# Patient Record
Sex: Female | Born: 1960 | Race: White | Hispanic: No | Marital: Married | Smoking: Former smoker
Health system: Southern US, Community
[De-identification: ages and names within clinical notes are randomized; demographics above are authoritative.]

## PROBLEM LIST (undated history)

## (undated) ENCOUNTER — Emergency Department (HOSPITAL_COMMUNITY): Payer: PRIVATE HEALTH INSURANCE | Source: Home / Self Care

## (undated) DIAGNOSIS — Z8051 Family history of malignant neoplasm of kidney: Secondary | ICD-10-CM

## (undated) DIAGNOSIS — M797 Fibromyalgia: Secondary | ICD-10-CM

## (undated) DIAGNOSIS — K449 Diaphragmatic hernia without obstruction or gangrene: Secondary | ICD-10-CM

## (undated) DIAGNOSIS — I252 Old myocardial infarction: Secondary | ICD-10-CM

## (undated) DIAGNOSIS — K5790 Diverticulosis of intestine, part unspecified, without perforation or abscess without bleeding: Secondary | ICD-10-CM

## (undated) DIAGNOSIS — R609 Edema, unspecified: Secondary | ICD-10-CM

## (undated) DIAGNOSIS — J189 Pneumonia, unspecified organism: Secondary | ICD-10-CM

## (undated) DIAGNOSIS — Z8249 Family history of ischemic heart disease and other diseases of the circulatory system: Secondary | ICD-10-CM

## (undated) DIAGNOSIS — Z9889 Other specified postprocedural states: Secondary | ICD-10-CM

## (undated) DIAGNOSIS — I10 Essential (primary) hypertension: Secondary | ICD-10-CM

## (undated) DIAGNOSIS — M722 Plantar fascial fibromatosis: Secondary | ICD-10-CM

## (undated) DIAGNOSIS — Z801 Family history of malignant neoplasm of trachea, bronchus and lung: Secondary | ICD-10-CM

## (undated) DIAGNOSIS — N390 Urinary tract infection, site not specified: Secondary | ICD-10-CM

## (undated) DIAGNOSIS — E559 Vitamin D deficiency, unspecified: Secondary | ICD-10-CM

## (undated) DIAGNOSIS — E039 Hypothyroidism, unspecified: Secondary | ICD-10-CM

## (undated) DIAGNOSIS — Z9289 Personal history of other medical treatment: Secondary | ICD-10-CM

## (undated) DIAGNOSIS — Z809 Family history of malignant neoplasm, unspecified: Secondary | ICD-10-CM

## (undated) DIAGNOSIS — R911 Solitary pulmonary nodule: Secondary | ICD-10-CM

## (undated) DIAGNOSIS — L853 Xerosis cutis: Secondary | ICD-10-CM

## (undated) DIAGNOSIS — Z803 Family history of malignant neoplasm of breast: Secondary | ICD-10-CM

## (undated) DIAGNOSIS — Z800 Family history of malignant neoplasm of digestive organs: Secondary | ICD-10-CM

## (undated) DIAGNOSIS — T7840XA Allergy, unspecified, initial encounter: Secondary | ICD-10-CM

## (undated) DIAGNOSIS — K2270 Barrett's esophagus without dysplasia: Secondary | ICD-10-CM

## (undated) DIAGNOSIS — I219 Acute myocardial infarction, unspecified: Secondary | ICD-10-CM

## (undated) DIAGNOSIS — Z8042 Family history of malignant neoplasm of prostate: Secondary | ICD-10-CM

## (undated) DIAGNOSIS — E669 Obesity, unspecified: Secondary | ICD-10-CM

## (undated) DIAGNOSIS — K0530 Chronic periodontitis, unspecified: Secondary | ICD-10-CM

## (undated) DIAGNOSIS — K219 Gastro-esophageal reflux disease without esophagitis: Secondary | ICD-10-CM

## (undated) DIAGNOSIS — Z973 Presence of spectacles and contact lenses: Secondary | ICD-10-CM

## (undated) DIAGNOSIS — M1990 Unspecified osteoarthritis, unspecified site: Secondary | ICD-10-CM

## (undated) DIAGNOSIS — R748 Abnormal levels of other serum enzymes: Secondary | ICD-10-CM

## (undated) DIAGNOSIS — R112 Nausea with vomiting, unspecified: Secondary | ICD-10-CM

## (undated) DIAGNOSIS — R51 Headache: Secondary | ICD-10-CM

## (undated) HISTORY — DX: Family history of malignant neoplasm, unspecified: Z80.9

## (undated) HISTORY — DX: Family history of ischemic heart disease and other diseases of the circulatory system: Z82.49

## (undated) HISTORY — DX: Family history of malignant neoplasm of breast: Z80.3

## (undated) HISTORY — DX: Old myocardial infarction: I25.2

## (undated) HISTORY — DX: Pneumonia, unspecified organism: J18.9

## (undated) HISTORY — DX: Plantar fascial fibromatosis: M72.2

## (undated) HISTORY — PX: KNEE SURGERY: SHX244

## (undated) HISTORY — DX: Fibromyalgia: M79.7

## (undated) HISTORY — DX: Obesity, unspecified: E66.9

## (undated) HISTORY — DX: Vitamin D deficiency, unspecified: E55.9

## (undated) HISTORY — DX: Hypothyroidism, unspecified: E03.9

## (undated) HISTORY — DX: Abnormal levels of other serum enzymes: R74.8

## (undated) HISTORY — DX: Gastro-esophageal reflux disease without esophagitis: K21.9

## (undated) HISTORY — DX: Urinary tract infection, site not specified: N39.0

## (undated) HISTORY — DX: Xerosis cutis: L85.3

## (undated) HISTORY — PX: LAPAROSCOPIC ENDOMETRIOSIS FULGURATION: SUR769

## (undated) HISTORY — DX: Headache: R51

## (undated) HISTORY — DX: Essential (primary) hypertension: I10

## (undated) HISTORY — DX: Family history of malignant neoplasm of kidney: Z80.51

## (undated) HISTORY — DX: Edema, unspecified: R60.9

## (undated) HISTORY — DX: Family history of malignant neoplasm of trachea, bronchus and lung: Z80.1

## (undated) HISTORY — DX: Unspecified osteoarthritis, unspecified site: M19.90

## (undated) HISTORY — DX: Diaphragmatic hernia without obstruction or gangrene: K44.9

## (undated) HISTORY — DX: Solitary pulmonary nodule: R91.1

## (undated) HISTORY — PX: ESOPHAGEAL DILATION: SHX303

## (undated) HISTORY — DX: Chronic periodontitis, unspecified: K05.30

## (undated) HISTORY — DX: Family history of malignant neoplasm of digestive organs: Z80.0

## (undated) HISTORY — DX: Presence of spectacles and contact lenses: Z97.3

## (undated) HISTORY — DX: Diverticulosis of intestine, part unspecified, without perforation or abscess without bleeding: K57.90

## (undated) HISTORY — DX: Family history of malignant neoplasm of prostate: Z80.42

## (undated) HISTORY — DX: Personal history of other medical treatment: Z92.89

## (undated) HISTORY — DX: Allergy, unspecified, initial encounter: T78.40XA

## (undated) HISTORY — DX: Barrett's esophagus without dysplasia: K22.70

---

## 1996-01-29 DIAGNOSIS — Z9289 Personal history of other medical treatment: Secondary | ICD-10-CM

## 1996-01-29 HISTORY — DX: Personal history of other medical treatment: Z92.89

## 1997-05-18 ENCOUNTER — Inpatient Hospital Stay (HOSPITAL_COMMUNITY): Admission: AD | Admit: 1997-05-18 | Discharge: 1997-05-18 | Payer: Self-pay | Admitting: Obstetrics

## 1997-06-07 ENCOUNTER — Ambulatory Visit (HOSPITAL_COMMUNITY): Admission: RE | Admit: 1997-06-07 | Discharge: 1997-06-07 | Payer: Self-pay | Admitting: Obstetrics and Gynecology

## 2002-11-11 ENCOUNTER — Encounter: Payer: Self-pay | Admitting: Emergency Medicine

## 2002-11-11 ENCOUNTER — Emergency Department (HOSPITAL_COMMUNITY): Admission: EM | Admit: 2002-11-11 | Discharge: 2002-11-11 | Payer: Self-pay | Admitting: Emergency Medicine

## 2004-04-25 ENCOUNTER — Observation Stay (HOSPITAL_COMMUNITY): Admission: RE | Admit: 2004-04-25 | Discharge: 2004-04-26 | Payer: Self-pay | Admitting: Obstetrics and Gynecology

## 2004-04-29 ENCOUNTER — Inpatient Hospital Stay (HOSPITAL_COMMUNITY): Admission: AD | Admit: 2004-04-29 | Discharge: 2004-04-29 | Payer: Self-pay | Admitting: Obstetrics and Gynecology

## 2006-05-24 ENCOUNTER — Emergency Department: Payer: Self-pay | Admitting: Emergency Medicine

## 2007-01-29 DIAGNOSIS — R748 Abnormal levels of other serum enzymes: Secondary | ICD-10-CM

## 2007-01-29 HISTORY — PX: CHOLECYSTECTOMY: SHX55

## 2007-01-29 HISTORY — DX: Abnormal levels of other serum enzymes: R74.8

## 2007-01-29 HISTORY — PX: COLONOSCOPY: SHX174

## 2008-01-29 DIAGNOSIS — R519 Headache, unspecified: Secondary | ICD-10-CM

## 2008-01-29 HISTORY — PX: VAGINAL HYSTERECTOMY: SUR661

## 2008-01-29 HISTORY — DX: Headache, unspecified: R51.9

## 2008-01-29 HISTORY — PX: CYSTOCELE REPAIR: SHX163

## 2008-01-29 HISTORY — PX: RECTOCELE REPAIR: SHX761

## 2010-01-28 HISTORY — PX: BLADDER SURGERY: SHX569

## 2010-07-04 ENCOUNTER — Other Ambulatory Visit: Payer: Self-pay | Admitting: Obstetrics & Gynecology

## 2010-07-04 DIAGNOSIS — N6489 Other specified disorders of breast: Secondary | ICD-10-CM

## 2010-08-18 ENCOUNTER — Ambulatory Visit: Payer: Self-pay | Admitting: Family Medicine

## 2010-10-09 ENCOUNTER — Ambulatory Visit (HOSPITAL_BASED_OUTPATIENT_CLINIC_OR_DEPARTMENT_OTHER)
Admission: RE | Admit: 2010-10-09 | Discharge: 2010-10-09 | Disposition: A | Discharge status: Home / Self Care | Payer: PRIVATE HEALTH INSURANCE | Source: Ambulatory Visit | Attending: Urology | Admitting: Urology

## 2010-10-09 ENCOUNTER — Ambulatory Visit (HOSPITAL_BASED_OUTPATIENT_CLINIC_OR_DEPARTMENT_OTHER): Admit: 2010-10-09 | Payer: Self-pay | Admitting: Urology

## 2010-10-09 ENCOUNTER — Ambulatory Visit (HOSPITAL_COMMUNITY): Payer: PRIVATE HEALTH INSURANCE | Attending: Anesthesiology

## 2010-10-09 DIAGNOSIS — N3010 Interstitial cystitis (chronic) without hematuria: Secondary | ICD-10-CM | POA: Insufficient documentation

## 2010-10-09 DIAGNOSIS — K219 Gastro-esophageal reflux disease without esophagitis: Secondary | ICD-10-CM | POA: Insufficient documentation

## 2010-10-09 DIAGNOSIS — N3946 Mixed incontinence: Secondary | ICD-10-CM | POA: Insufficient documentation

## 2010-10-09 DIAGNOSIS — I517 Cardiomegaly: Secondary | ICD-10-CM | POA: Insufficient documentation

## 2010-10-09 DIAGNOSIS — R112 Nausea with vomiting, unspecified: Secondary | ICD-10-CM | POA: Insufficient documentation

## 2010-10-09 DIAGNOSIS — N949 Unspecified condition associated with female genital organs and menstrual cycle: Secondary | ICD-10-CM | POA: Insufficient documentation

## 2010-10-09 DIAGNOSIS — J45909 Unspecified asthma, uncomplicated: Secondary | ICD-10-CM | POA: Insufficient documentation

## 2010-10-09 LAB — POCT HEMOGLOBIN-HEMACUE: Hemoglobin: 13.6 g/dL (ref 12.0–15.0)

## 2010-10-23 NOTE — Op Note (Signed)
  NAMEHAIVEN, NARDONE     ACCOUNT NO.:  [id number]  MEDICAL RECORD NO.:  [id number]  LOCATION:                               FACILITY:  [facility]  PHYSICIAN:  Martina Sinner, MD DATE OF BIRTH:  May 04, 1960  DATE OF PROCEDURE:  10/09/2010 DATE OF DISCHARGE:                              OPERATIVE REPORT   PREOPERATIVE DIAGNOSIS:  Pelvic pain.  POSTOPERATIVE DIAGNOSIS:  Pelvic pain.  SURGERY:  Cystoscopy, bladder hydrodistention, bladder instillation therapy.  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  She has complicated voiding dysfunction.  She has significant pelvic pain, worse for the last several days.  The patient was prepped and draped in usual fashion.  A 21 [place] scope was utilized.  Bladder mucosa and trigone were normal.  There is no stitch, foreign body, or carcinoma.  Bladder was hydrodissected to 450 mL.  Bladder was emptied.  I reinspected the bladder and there are no glomerulations.  Our finding is interstitial cystitis.  Bladder was emptied.  As a separate procedure, a latex-free catheter was inserted in the bladder and I instilled 15 cc of 0.5% Marcaine plus 400 mg of Pyridium.  My usual techniques will be utilized to diagnose the patient with interstitial cystitis.          ______________________________ Martina Sinner, MD     SAM/MEDQ  D:  10/09/2010  T:  10/09/2010  Job:  [id number]  Electronically Signed by Alfredo Martinez MD on 10/23/2010 09:48:32 AM

## 2011-03-26 ENCOUNTER — Encounter: Payer: Self-pay | Admitting: Medical

## 2011-03-26 ENCOUNTER — Ambulatory Visit (INDEPENDENT_AMBULATORY_CARE_PROVIDER_SITE_OTHER): Payer: No Typology Code available for payment source | Admitting: Medical

## 2011-03-26 VITALS — BP 110/80 | HR 76 | Temp 97.8°F | Resp 16 | Ht 62.0 in | Wt 235.0 lb

## 2011-03-26 DIAGNOSIS — R51 Headache: Secondary | ICD-10-CM

## 2011-03-26 DIAGNOSIS — Z0000 Encounter for general adult medical examination without abnormal findings: Secondary | ICD-10-CM

## 2011-03-26 DIAGNOSIS — J45909 Unspecified asthma, uncomplicated: Secondary | ICD-10-CM | POA: Insufficient documentation

## 2011-03-26 DIAGNOSIS — K219 Gastro-esophageal reflux disease without esophagitis: Secondary | ICD-10-CM | POA: Insufficient documentation

## 2011-03-26 DIAGNOSIS — R040 Epistaxis: Secondary | ICD-10-CM

## 2011-03-26 DIAGNOSIS — E039 Hypothyroidism, unspecified: Secondary | ICD-10-CM | POA: Insufficient documentation

## 2011-03-26 LAB — POCT URINALYSIS DIPSTICK
Nitrite, UA: NEGATIVE
Spec Grav, UA: 1.025
Urobilinogen, UA: NEGATIVE

## 2011-03-26 MED ORDER — LEVOTHYROXINE SODIUM 200 MCG PO TABS: 200.0000 ug | ORAL_TABLET | Freq: Every day | ORAL | Status: DC

## 2011-03-26 MED ORDER — LIOTHYRONINE SODIUM 25 MCG PO TABS: 25.0000 ug | ORAL_TABLET | Freq: Every day | ORAL | Status: DC

## 2011-03-26 MED ORDER — FLUTICASONE-SALMETEROL 100-50 MCG/DOSE IN AEPB: 1.0000 | INHALATION_SPRAY | Freq: Two times a day (BID) | RESPIRATORY_TRACT | Status: DC

## 2011-03-26 MED ORDER — ESOMEPRAZOLE MAGNESIUM 40 MG PO CPDR: 40.0000 mg | DELAYED_RELEASE_CAPSULE | Freq: Every day | ORAL | Status: DC

## 2011-03-26 MED ORDER — ALBUTEROL SULFATE HFA 108 (90 BASE) MCG/ACT IN AERS: 2.0000 | INHALATION_SPRAY | Freq: Four times a day (QID) | RESPIRATORY_TRACT | Status: DC | PRN
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
## 2011-03-26 NOTE — Patient Instructions (Signed)

## 2011-03-27 ENCOUNTER — Telehealth: Payer: Self-pay | Admitting: Family Medicine

## 2011-03-28 ENCOUNTER — Other Ambulatory Visit: Payer: Self-pay | Admitting: Medical

## 2011-03-28 DIAGNOSIS — R040 Epistaxis: Secondary | ICD-10-CM

## 2011-03-28 DIAGNOSIS — R51 Headache: Secondary | ICD-10-CM

## 2011-03-28 MED ORDER — LEVOTHYROXINE SODIUM 200 MCG PO TABS: 200.0000 ug | ORAL_TABLET | Freq: Every day | ORAL | Status: DC

## 2011-03-28 MED ORDER — LIOTHYRONINE SODIUM 25 MCG PO TABS: 25.0000 ug | ORAL_TABLET | Freq: Every day | ORAL | Status: DC

## 2011-03-28 MED ORDER — ESOMEPRAZOLE MAGNESIUM 40 MG PO CPDR: 40.0000 mg | DELAYED_RELEASE_CAPSULE | Freq: Every day | ORAL | Status: DC

## 2011-03-28 MED ORDER — ALBUTEROL SULFATE HFA 108 (90 BASE) MCG/ACT IN AERS: 2.0000 | INHALATION_SPRAY | Freq: Four times a day (QID) | RESPIRATORY_TRACT | Status: DC | PRN

## 2011-03-28 MED ORDER — FLUTICASONE-SALMETEROL 100-50 MCG/DOSE IN AEPB: 1.0000 | INHALATION_SPRAY | Freq: Two times a day (BID) | RESPIRATORY_TRACT | Status: DC

## 2011-03-28 NOTE — Telephone Encounter (Signed)
i sent all med refills just now.

## 2011-03-29 ENCOUNTER — Telehealth: Payer: Self-pay | Admitting: Family Medicine

## 2011-03-29 NOTE — Telephone Encounter (Signed)
[place], IMAGING 04/01/11 @ 1215PM. CLS

## 2011-03-29 NOTE — Telephone Encounter (Signed)
Message copied by Janeice Robinson on Fri Mar 29, 2011  9:12 AM ------      Message from: Aleen Campi, DAVID S      Created: Thu Mar 28, 2011 10:45 AM       pls set up head CT without contrast, due to new onset chronic headache age 51 with nosebleeds.  Order is in the computer.

## 2011-03-29 NOTE — Telephone Encounter (Signed)
PATIENT WAS NOTIFIED THAT HER MEDICATIONS WAS SENT TO THE PHARMACY. CLS

## 2011-04-01 ENCOUNTER — Ambulatory Visit
Admission: RE | Admit: 2011-04-01 | Discharge: 2011-04-01 | Disposition: A | Discharge status: Home / Self Care | Payer: No Typology Code available for payment source | Source: Ambulatory Visit | Attending: Medical | Admitting: Medical

## 2011-04-01 DIAGNOSIS — R51 Headache: Secondary | ICD-10-CM

## 2011-04-01 DIAGNOSIS — R040 Epistaxis: Secondary | ICD-10-CM

## 2011-04-23 ENCOUNTER — Ambulatory Visit (INDEPENDENT_AMBULATORY_CARE_PROVIDER_SITE_OTHER): Payer: No Typology Code available for payment source | Admitting: Medical

## 2011-04-23 ENCOUNTER — Encounter: Payer: Self-pay | Admitting: Medical

## 2011-04-23 VITALS — BP 102/80 | HR 78 | Temp 97.7°F | Resp 16 | Wt 231.0 lb

## 2011-04-23 DIAGNOSIS — E039 Hypothyroidism, unspecified: Secondary | ICD-10-CM

## 2011-04-23 DIAGNOSIS — R51 Headache: Secondary | ICD-10-CM

## 2011-04-23 DIAGNOSIS — J329 Chronic sinusitis, unspecified: Secondary | ICD-10-CM

## 2011-04-23 DIAGNOSIS — K219 Gastro-esophageal reflux disease without esophagitis: Secondary | ICD-10-CM

## 2011-04-23 LAB — TSH: TSH: 0.727 u[IU]/mL (ref 0.350–4.500)

## 2011-04-23 MED ORDER — AMOXICILLIN 875 MG PO TABS: 875.0000 mg | ORAL_TABLET | Freq: Two times a day (BID) | ORAL | Status: AC

## 2011-04-23 MED ORDER — ESOMEPRAZOLE MAGNESIUM 40 MG PO CPDR: 40.0000 mg | DELAYED_RELEASE_CAPSULE | Freq: Every day | ORAL | Status: DC
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
## 2011-04-24 ENCOUNTER — Other Ambulatory Visit: Payer: Self-pay | Admitting: Medical

## 2011-04-24 MED ORDER — LIOTHYRONINE SODIUM 5 MCG PO TABS: 5.0000 ug | ORAL_TABLET | Freq: Every day | ORAL | Status: DC

## 2011-04-24 MED ORDER — VITAMIN D (ERGOCALCIFEROL) 1.25 MG (50000 UNIT) PO CAPS: 50000.0000 [IU] | ORAL_CAPSULE | ORAL | Status: DC

## 2011-05-31 ENCOUNTER — Ambulatory Visit (INDEPENDENT_AMBULATORY_CARE_PROVIDER_SITE_OTHER): Payer: No Typology Code available for payment source | Admitting: Medical

## 2011-05-31 ENCOUNTER — Encounter: Payer: Self-pay | Admitting: Medical

## 2011-05-31 ENCOUNTER — Ambulatory Visit
Admission: RE | Admit: 2011-05-31 | Discharge: 2011-05-31 | Disposition: A | Discharge status: Home / Self Care | Payer: No Typology Code available for payment source | Source: Ambulatory Visit | Attending: Medical | Admitting: Medical

## 2011-05-31 VITALS — BP 102/70 | HR 84 | Temp 97.6°F | Resp 16 | Wt 220.0 lb

## 2011-05-31 DIAGNOSIS — S93409A Sprain of unspecified ligament of unspecified ankle, initial encounter: Secondary | ICD-10-CM

## 2011-05-31 DIAGNOSIS — R51 Headache: Secondary | ICD-10-CM

## 2011-05-31 DIAGNOSIS — E039 Hypothyroidism, unspecified: Secondary | ICD-10-CM

## 2011-05-31 DIAGNOSIS — S99911A Unspecified injury of right ankle, initial encounter: Secondary | ICD-10-CM

## 2011-05-31 DIAGNOSIS — S8990XA Unspecified injury of unspecified lower leg, initial encounter: Secondary | ICD-10-CM

## 2011-05-31 DIAGNOSIS — S93401A Sprain of unspecified ligament of right ankle, initial encounter: Secondary | ICD-10-CM

## 2011-05-31 LAB — TSH: TSH: 0.116 u[IU]/mL — ABNORMAL LOW (ref 0.350–4.500)

## 2011-05-31 LAB — T4, FREE: Free T4: 2.33 ng/dL — ABNORMAL HIGH (ref 0.80–1.80)

## 2011-05-31 MED ORDER — TOPIRAMATE 25 MG PO TABS: ORAL_TABLET | ORAL | Status: DC

## 2011-05-31 NOTE — Patient Instructions (Signed)
I want you to elevate the leg often.  I will you to start alternating between ice and heat.  For example, ice for 20 minutes, let the foot then warm up to room temp, then heat for 20 minutes.  Alternate this regiment 2-3 times daily if possible  i would still use crutches some to help healing, but start putting more weight on the foot in general.  Continue the brace for next 10-14 days.  At this point, continue doing either towel exercise or ABCs for rehabilitation.    Consider ankle sleeve for the swelling.  If not much improved in 10-14 days, then lets consider formal physical therapy.
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
## 2011-06-04 ENCOUNTER — Other Ambulatory Visit: Payer: Self-pay | Admitting: Medical

## 2011-06-04 DIAGNOSIS — E039 Hypothyroidism, unspecified: Secondary | ICD-10-CM

## 2011-06-07 ENCOUNTER — Encounter: Payer: Self-pay | Admitting: Medical

## 2011-06-07 ENCOUNTER — Ambulatory Visit (INDEPENDENT_AMBULATORY_CARE_PROVIDER_SITE_OTHER): Payer: No Typology Code available for payment source | Admitting: Medical

## 2011-06-07 ENCOUNTER — Telehealth: Payer: Self-pay | Admitting: Family Medicine

## 2011-06-07 VITALS — BP 118/88 | HR 84 | Temp 97.8°F | Resp 16 | Wt 226.0 lb

## 2011-06-07 DIAGNOSIS — E039 Hypothyroidism, unspecified: Secondary | ICD-10-CM

## 2011-06-07 DIAGNOSIS — S99911A Unspecified injury of right ankle, initial encounter: Secondary | ICD-10-CM

## 2011-06-07 DIAGNOSIS — S8990XA Unspecified injury of unspecified lower leg, initial encounter: Secondary | ICD-10-CM

## 2011-06-07 DIAGNOSIS — S93409A Sprain of unspecified ligament of unspecified ankle, initial encounter: Secondary | ICD-10-CM

## 2011-06-07 DIAGNOSIS — S99919A Unspecified injury of unspecified ankle, initial encounter: Secondary | ICD-10-CM

## 2011-06-07 NOTE — Telephone Encounter (Signed)
PATIENT IS AWARE OF HER APPOINTMENT AS OF 06/07/11 @ 1059 AM. CLS MURPHY Vanessa Berg. [address] [place], [place] APPOINTMENT 06/07/11 @ 200 PM. CLS
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
## 2011-07-10 ENCOUNTER — Other Ambulatory Visit: Payer: No Typology Code available for payment source

## 2011-07-12 ENCOUNTER — Other Ambulatory Visit: Payer: No Typology Code available for payment source

## 2011-07-12 DIAGNOSIS — E039 Hypothyroidism, unspecified: Secondary | ICD-10-CM

## 2011-07-12 LAB — T4, FREE: Free T4: 1.61 ng/dL (ref 0.80–1.80)

## 2011-07-15 ENCOUNTER — Other Ambulatory Visit: Payer: Self-pay | Admitting: Medical

## 2011-07-15 MED ORDER — LEVOTHYROXINE SODIUM 175 MCG PO TABS: 175.0000 ug | ORAL_TABLET | Freq: Every day | ORAL | Status: DC

## 2011-07-19 ENCOUNTER — Telehealth: Payer: Self-pay | Admitting: Medical

## 2011-07-19 NOTE — Telephone Encounter (Signed)
PATIENT STATES THAT SHE HAS NEVER HAD A PROBLEM WITH THE PREDINSONE OR THE STERIOD BEFORE AND SHE HAS TAKEN THEM. PATIENT WAS MADE AWARE THAT SHE COULD USE THE MEDICATION PATCH. CLS

## 2011-07-19 NOTE — Telephone Encounter (Signed)
Has she ever used prednisone or other steroids? Any problems with these?  If not, I think the medicated patch would be fine.

## 2011-08-21 ENCOUNTER — Other Ambulatory Visit: Payer: Self-pay | Admitting: Orthopedic Surgery

## 2011-08-21 DIAGNOSIS — R609 Edema, unspecified: Secondary | ICD-10-CM

## 2011-08-22 ENCOUNTER — Other Ambulatory Visit: Payer: No Typology Code available for payment source

## 2011-09-10 ENCOUNTER — Telehealth: Payer: Self-pay | Admitting: Family Medicine

## 2011-09-16 ENCOUNTER — Encounter: Payer: No Typology Code available for payment source | Admitting: Medical

## 2011-09-19 ENCOUNTER — Other Ambulatory Visit: Payer: Self-pay | Admitting: Orthopedic Surgery

## 2011-09-19 DIAGNOSIS — M722 Plantar fascial fibromatosis: Secondary | ICD-10-CM

## 2011-09-22 ENCOUNTER — Ambulatory Visit
Admission: RE | Admit: 2011-09-22 | Discharge: 2011-09-22 | Disposition: A | Discharge status: Home / Self Care | Payer: No Typology Code available for payment source | Source: Ambulatory Visit | Attending: Orthopedic Surgery | Admitting: Orthopedic Surgery

## 2011-09-22 DIAGNOSIS — M722 Plantar fascial fibromatosis: Secondary | ICD-10-CM

## 2011-09-25 ENCOUNTER — Encounter: Payer: Self-pay | Admitting: Medical

## 2011-09-25 ENCOUNTER — Ambulatory Visit (INDEPENDENT_AMBULATORY_CARE_PROVIDER_SITE_OTHER): Payer: No Typology Code available for payment source | Admitting: Medical

## 2011-09-25 VITALS — BP 128/80 | HR 80 | Temp 97.6°F | Resp 16 | Wt 226.0 lb

## 2011-09-25 DIAGNOSIS — J45909 Unspecified asthma, uncomplicated: Secondary | ICD-10-CM

## 2011-09-25 DIAGNOSIS — K219 Gastro-esophageal reflux disease without esophagitis: Secondary | ICD-10-CM

## 2011-09-25 DIAGNOSIS — M629 Disorder of muscle, unspecified: Secondary | ICD-10-CM

## 2011-09-25 DIAGNOSIS — E039 Hypothyroidism, unspecified: Secondary | ICD-10-CM

## 2011-09-25 DIAGNOSIS — E669 Obesity, unspecified: Secondary | ICD-10-CM | POA: Insufficient documentation

## 2011-09-25 DIAGNOSIS — R51 Headache: Secondary | ICD-10-CM

## 2011-09-25 MED ORDER — OMEPRAZOLE 40 MG PO CPDR: 40.0000 mg | DELAYED_RELEASE_CAPSULE | Freq: Every day | ORAL | Status: DC
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
## 2011-09-26 LAB — BASIC METABOLIC PANEL
CO2: 28 mEq/L (ref 19–32)
Calcium: 9.3 mg/dL (ref 8.4–10.5)
Creat: 0.71 mg/dL (ref 0.50–1.10)
Glucose, Bld: 84 mg/dL (ref 70–99)

## 2011-09-26 LAB — TSH: TSH: 0.162 u[IU]/mL — ABNORMAL LOW (ref 0.350–4.500)

## 2011-09-26 LAB — HEMOGLOBIN A1C: Mean Plasma Glucose: 114 mg/dL (ref <117)

## 2011-09-26 LAB — T4, FREE: Free T4: 1.18 ng/dL (ref 0.80–1.80)

## 2011-10-03 ENCOUNTER — Encounter: Payer: Self-pay | Admitting: Family Medicine

## 2011-10-03 ENCOUNTER — Ambulatory Visit (INDEPENDENT_AMBULATORY_CARE_PROVIDER_SITE_OTHER): Payer: No Typology Code available for payment source | Admitting: Family Medicine

## 2011-10-03 VITALS — BP 124/80 | HR 64 | Temp 97.8°F | Wt 232.0 lb

## 2011-10-03 DIAGNOSIS — J45909 Unspecified asthma, uncomplicated: Secondary | ICD-10-CM

## 2011-10-03 DIAGNOSIS — J309 Allergic rhinitis, unspecified: Secondary | ICD-10-CM

## 2011-10-03 DIAGNOSIS — J209 Acute bronchitis, unspecified: Secondary | ICD-10-CM

## 2011-10-03 MED ORDER — HYDROCOD POLST-CHLORPHEN POLST 10-8 MG/5ML PO LQCR: 5.0000 mL | Freq: Two times a day (BID) | ORAL | Status: DC | PRN

## 2011-10-03 MED ORDER — AMOXICILLIN 875 MG PO TABS: 875.0000 mg | ORAL_TABLET | Freq: Two times a day (BID) | ORAL | Status: AC

## 2011-10-03 NOTE — Progress Notes (Signed)
  Subjective:    Patient ID: Vanessa Berg, female    DOB: Jan 22, 1961, 51 y.o.   MRN: [id number]  HPI 5 days ago she started having difficulty with a productive cough, fever,SOB,, arthralgias ,headache. Now she is also having difficulty with rhinorrhea and sneezing. She does have seasonal allergies and asthma. She continues on medications listed in the chart She does not smoke.   Review of Systems     Objective:   Physical Exam alert and in no distress. Tympanic membranes and canals are normal. Throat is clear. Tonsils are normal. Neck is supple without adenopathy or thyromegaly. Cardiac exam shows a regular sinus rhythm without murmurs or gallops. Lungs are clear to auscultation.        Assessment & Plan:   1. Asthma    2. Allergic rhinitis, mild    3. Acute bronchitis  amoxicillin (AMOXIL) 875 MG tablet, chlorpheniramine-HYDROcodone (TUSSIONEX PENNKINETIC ER) 10-8 MG/5ML LQCR   is to continue on her present medication regimen. She will call at the end of the time frame if not entirely back to normal

## 2011-10-03 NOTE — Patient Instructions (Signed)
Stay on all your medications and take all the antibiotic. If you're not totally back to normal at the end of the antibiotic call me.

## 2011-10-04 ENCOUNTER — Other Ambulatory Visit: Payer: Self-pay

## 2011-10-04 NOTE — Telephone Encounter (Signed)
Call cough med in

## 2012-01-29 DIAGNOSIS — T7840XA Allergy, unspecified, initial encounter: Secondary | ICD-10-CM

## 2012-01-29 HISTORY — DX: Allergy, unspecified, initial encounter: T78.40XA

## 2012-01-30 ENCOUNTER — Ambulatory Visit (INDEPENDENT_AMBULATORY_CARE_PROVIDER_SITE_OTHER): Payer: PRIVATE HEALTH INSURANCE | Admitting: Family Medicine

## 2012-01-30 ENCOUNTER — Encounter: Payer: Self-pay | Admitting: Family Medicine

## 2012-01-30 VITALS — BP 120/80 | HR 66 | Temp 97.5°F | Wt 240.0 lb

## 2012-01-30 DIAGNOSIS — J45909 Unspecified asthma, uncomplicated: Secondary | ICD-10-CM

## 2012-01-30 DIAGNOSIS — J209 Acute bronchitis, unspecified: Secondary | ICD-10-CM

## 2012-01-30 DIAGNOSIS — J45901 Unspecified asthma with (acute) exacerbation: Secondary | ICD-10-CM

## 2012-01-30 MED ORDER — AMOXICILLIN 875 MG PO TABS: 875.0000 mg | ORAL_TABLET | Freq: Two times a day (BID) | ORAL | Status: DC

## 2012-01-30 NOTE — Progress Notes (Signed)
  Subjective:    Patient ID: Vanessa Berg, female    DOB: [birth year], 52 y.o.   MRN: [id number]  HPI She has a 4 day history this started with earache and coughing followed by productive cough yesterday with nausea and diarrhea, sore throat , fever chills and myalgias. She continues on her inhalers. She does not smoke.  Review of Systems     Objective:   Physical Exam alert and in no distress. Tympanic membranes and canals are normal. Throat is clear. Tonsils are normal. Neck is supple without adenopathy or thyromegaly. Cardiac exam shows a regular sinus rhythm without murmurs or gallops. Lungs are clear to auscultation.        Assessment & Plan:   1. Asthma    2. Acute bronchitis with asthma  amoxicillin (AMOXIL) 875 MG tablet   apparently the patient has multiple allergies. Recommend NyQuil at night. She will check to make sure it does not have Tylenol in it. She is to continue her asthma medications.

## 2012-01-30 NOTE — Patient Instructions (Addendum)
Use NyQuil at night to help with coughing

## 2012-02-25 ENCOUNTER — Encounter: Payer: Self-pay | Admitting: Medical

## 2012-02-25 ENCOUNTER — Ambulatory Visit (INDEPENDENT_AMBULATORY_CARE_PROVIDER_SITE_OTHER): Payer: PRIVATE HEALTH INSURANCE | Admitting: Medical

## 2012-02-25 VITALS — BP 120/80 | HR 62 | Temp 97.7°F | Resp 16 | Wt 236.0 lb

## 2012-02-25 DIAGNOSIS — R945 Abnormal results of liver function studies: Secondary | ICD-10-CM

## 2012-02-25 DIAGNOSIS — R7989 Other specified abnormal findings of blood chemistry: Secondary | ICD-10-CM

## 2012-02-25 DIAGNOSIS — R109 Unspecified abdominal pain: Secondary | ICD-10-CM

## 2012-02-25 DIAGNOSIS — E039 Hypothyroidism, unspecified: Secondary | ICD-10-CM

## 2012-02-25 DIAGNOSIS — R3129 Other microscopic hematuria: Secondary | ICD-10-CM

## 2012-02-25 LAB — POCT URINALYSIS DIPSTICK
Nitrite, UA: NEGATIVE
Urobilinogen, UA: NEGATIVE
pH, UA: 6
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
## 2012-02-26 ENCOUNTER — Other Ambulatory Visit: Payer: Self-pay | Admitting: Medical

## 2012-02-26 LAB — HEPATIC FUNCTION PANEL
ALT: 39 U/L — ABNORMAL HIGH (ref 0–35)
Total Protein: 6.8 g/dL (ref 6.0–8.3)

## 2012-02-26 LAB — GAMMA GT: GGT: 21 U/L (ref 7–51)

## 2012-02-26 MED ORDER — SYNTHROID 200 MCG PO TABS: 200.0000 ug | ORAL_TABLET | Freq: Every day | ORAL | Status: DC

## 2012-02-27 LAB — URINE CULTURE: Colony Count: 8000

## 2012-03-04 ENCOUNTER — Telehealth: Payer: Self-pay | Admitting: Internal Medicine

## 2012-03-04 ENCOUNTER — Other Ambulatory Visit: Payer: Self-pay | Admitting: Medical

## 2012-03-04 MED ORDER — LEVOTHYROXINE SODIUM 200 MCG PO TABS: 200.0000 ug | ORAL_TABLET | Freq: Every day | ORAL | Status: DC

## 2012-03-04 NOTE — Telephone Encounter (Signed)
i sent the generic, but I strongly recommend she stay on the Name brand of this type of medication.  Is the reason for not wanting the name brand cost?  Do we have coupon cards in the closet.

## 2012-03-04 NOTE — Telephone Encounter (Signed)
Patient is aware of her medication sent to the pharmacy in generic. CLS

## 2012-03-09 ENCOUNTER — Encounter: Payer: Self-pay | Admitting: Medical

## 2012-03-10 ENCOUNTER — Encounter: Payer: Self-pay | Admitting: Medical

## 2012-03-26 ENCOUNTER — Other Ambulatory Visit: Payer: Self-pay | Admitting: Medical

## 2012-03-26 ENCOUNTER — Telehealth: Payer: Self-pay | Admitting: Internal Medicine

## 2012-03-26 MED ORDER — ALBUTEROL SULFATE HFA 108 (90 BASE) MCG/ACT IN AERS: 2.0000 | INHALATION_SPRAY | Freq: Four times a day (QID) | RESPIRATORY_TRACT | Status: DC | PRN

## 2012-03-27 NOTE — Telephone Encounter (Signed)
done

## 2012-04-28 ENCOUNTER — Other Ambulatory Visit: Payer: Self-pay | Admitting: Medical

## 2012-05-25 ENCOUNTER — Other Ambulatory Visit: Payer: Self-pay

## 2012-05-25 DIAGNOSIS — Z1231 Encounter for screening mammogram for malignant neoplasm of breast: Secondary | ICD-10-CM

## 2012-05-28 DIAGNOSIS — R911 Solitary pulmonary nodule: Secondary | ICD-10-CM

## 2012-05-28 HISTORY — DX: Solitary pulmonary nodule: R91.1

## 2012-06-05 ENCOUNTER — Ambulatory Visit
Admission: RE | Admit: 2012-06-05 | Discharge: 2012-06-05 | Disposition: A | Discharge status: Home / Self Care | Payer: No Typology Code available for payment source | Source: Ambulatory Visit

## 2012-06-05 ENCOUNTER — Encounter: Payer: Self-pay | Admitting: Family Medicine

## 2012-06-05 DIAGNOSIS — Z1231 Encounter for screening mammogram for malignant neoplasm of breast: Secondary | ICD-10-CM

## 2012-06-23 ENCOUNTER — Ambulatory Visit: Payer: Self-pay | Admitting: Family Medicine

## 2012-06-26 ENCOUNTER — Encounter (HOSPITAL_COMMUNITY): Payer: Self-pay | Admitting: Emergency Medicine

## 2012-06-26 ENCOUNTER — Other Ambulatory Visit: Payer: Self-pay | Admitting: Medical

## 2012-06-26 ENCOUNTER — Emergency Department (HOSPITAL_COMMUNITY)
Admission: EM | Admit: 2012-06-26 | Discharge: 2012-06-27 | Disposition: A | Discharge status: Home / Self Care | Payer: No Typology Code available for payment source | Attending: Emergency Medicine | Admitting: Emergency Medicine

## 2012-06-26 DIAGNOSIS — Z91040 Latex allergy status: Secondary | ICD-10-CM | POA: Insufficient documentation

## 2012-06-26 DIAGNOSIS — E039 Hypothyroidism, unspecified: Secondary | ICD-10-CM | POA: Insufficient documentation

## 2012-06-26 DIAGNOSIS — Z9089 Acquired absence of other organs: Secondary | ICD-10-CM | POA: Insufficient documentation

## 2012-06-26 DIAGNOSIS — Z8719 Personal history of other diseases of the digestive system: Secondary | ICD-10-CM | POA: Insufficient documentation

## 2012-06-26 DIAGNOSIS — Z9889 Other specified postprocedural states: Secondary | ICD-10-CM | POA: Insufficient documentation

## 2012-06-26 DIAGNOSIS — Z789 Other specified health status: Secondary | ICD-10-CM | POA: Insufficient documentation

## 2012-06-26 DIAGNOSIS — Y9389 Activity, other specified: Secondary | ICD-10-CM | POA: Insufficient documentation

## 2012-06-26 DIAGNOSIS — Z8639 Personal history of other endocrine, nutritional and metabolic disease: Secondary | ICD-10-CM | POA: Insufficient documentation

## 2012-06-26 DIAGNOSIS — IMO0002 Reserved for concepts with insufficient information to code with codable children: Secondary | ICD-10-CM | POA: Insufficient documentation

## 2012-06-26 DIAGNOSIS — J45909 Unspecified asthma, uncomplicated: Secondary | ICD-10-CM | POA: Insufficient documentation

## 2012-06-26 DIAGNOSIS — M129 Arthropathy, unspecified: Secondary | ICD-10-CM | POA: Insufficient documentation

## 2012-06-26 DIAGNOSIS — Z79899 Other long term (current) drug therapy: Secondary | ICD-10-CM | POA: Insufficient documentation

## 2012-06-26 DIAGNOSIS — S20211A Contusion of right front wall of thorax, initial encounter: Secondary | ICD-10-CM

## 2012-06-26 DIAGNOSIS — Y92410 Unspecified street and highway as the place of occurrence of the external cause: Secondary | ICD-10-CM | POA: Insufficient documentation

## 2012-06-26 DIAGNOSIS — Z8701 Personal history of pneumonia (recurrent): Secondary | ICD-10-CM | POA: Insufficient documentation

## 2012-06-26 DIAGNOSIS — Z8739 Personal history of other diseases of the musculoskeletal system and connective tissue: Secondary | ICD-10-CM | POA: Insufficient documentation

## 2012-06-26 DIAGNOSIS — G8929 Other chronic pain: Secondary | ICD-10-CM | POA: Insufficient documentation

## 2012-06-26 DIAGNOSIS — Z87891 Personal history of nicotine dependence: Secondary | ICD-10-CM | POA: Insufficient documentation

## 2012-06-26 DIAGNOSIS — S301XXA Contusion of abdominal wall, initial encounter: Secondary | ICD-10-CM

## 2012-06-26 DIAGNOSIS — R110 Nausea: Secondary | ICD-10-CM | POA: Insufficient documentation

## 2012-06-26 DIAGNOSIS — Z87440 Personal history of urinary (tract) infections: Secondary | ICD-10-CM | POA: Insufficient documentation

## 2012-06-26 DIAGNOSIS — Z872 Personal history of diseases of the skin and subcutaneous tissue: Secondary | ICD-10-CM | POA: Insufficient documentation

## 2012-06-26 DIAGNOSIS — R51 Headache: Secondary | ICD-10-CM | POA: Insufficient documentation

## 2012-06-26 DIAGNOSIS — Z90710 Acquired absence of both cervix and uterus: Secondary | ICD-10-CM | POA: Insufficient documentation

## 2012-06-26 DIAGNOSIS — K219 Gastro-esophageal reflux disease without esophagitis: Secondary | ICD-10-CM | POA: Insufficient documentation

## 2012-06-26 DIAGNOSIS — Z7982 Long term (current) use of aspirin: Secondary | ICD-10-CM | POA: Insufficient documentation

## 2012-06-26 DIAGNOSIS — S20219A Contusion of unspecified front wall of thorax, initial encounter: Secondary | ICD-10-CM | POA: Insufficient documentation

## 2012-06-26 DIAGNOSIS — Z862 Personal history of diseases of the blood and blood-forming organs and certain disorders involving the immune mechanism: Secondary | ICD-10-CM | POA: Insufficient documentation

## 2012-06-26 DIAGNOSIS — E669 Obesity, unspecified: Secondary | ICD-10-CM

## 2012-06-26 LAB — CBC
MCH: 29.5 pg (ref 26.0–34.0)
MCHC: 34.5 g/dL (ref 30.0–36.0)
Platelets: 262 10*3/uL (ref 150–400)
RDW: 13.6 % (ref 11.5–15.5)

## 2012-06-26 LAB — BASIC METABOLIC PANEL
Calcium: 9.6 mg/dL (ref 8.4–10.5)
GFR calc non Af Amer: >90 mL/min (ref >90)
Glucose, Bld: 111 mg/dL — ABNORMAL HIGH (ref 70–99)
Sodium: 141 mEq/L (ref 135–145)

## 2012-06-26 LAB — POCT I-STAT TROPONIN I

## 2012-06-26 MED ORDER — ONDANSETRON HCL 4 MG/2ML IJ SOLN
4.0000 mg | Freq: Once | INTRAMUSCULAR | Status: AC
  Administered 2012-06-27: 4 mg via INTRAVENOUS
  Filled 2012-06-26: qty 2

## 2012-06-26 MED ORDER — MORPHINE SULFATE 2 MG/ML IJ SOLN
2.0000 mg | Freq: Once | INTRAMUSCULAR | Status: AC
  Administered 2012-06-27: 2 mg via INTRAVENOUS
  Filled 2012-06-26: qty 1

## 2012-06-26 NOTE — ED Notes (Signed)
Pt in no obvious distress. Respirations e/u. Chest expansion symmetrical. Lung sounds clear bilaterally.
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
## 2012-06-26 NOTE — ED Notes (Signed)
Pt now complaining of abdominal pain.
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
## 2012-06-27 ENCOUNTER — Emergency Department (HOSPITAL_COMMUNITY): Payer: No Typology Code available for payment source

## 2012-06-27 ENCOUNTER — Encounter (HOSPITAL_COMMUNITY): Payer: Self-pay | Admitting: Radiology

## 2012-06-27 LAB — ABO/RH: ABO/RH(D): O POS

## 2012-06-27 LAB — TYPE AND SCREEN: ABO/RH(D): O POS

## 2012-06-27 MED ORDER — TRAMADOL HCL 50 MG PO TABS: 50.0000 mg | ORAL_TABLET | Freq: Four times a day (QID) | ORAL | Status: DC | PRN

## 2012-06-27 MED ORDER — IOHEXOL 300 MG/ML  SOLN
100.0000 mL | Freq: Once | INTRAMUSCULAR | Status: AC | PRN
  Administered 2012-06-27: 80 mL via INTRAVENOUS

## 2012-06-27 MED ORDER — ONDANSETRON HCL 4 MG/2ML IJ SOLN
4.0000 mg | Freq: Once | INTRAMUSCULAR | Status: AC
  Administered 2012-06-27: 4 mg via INTRAVENOUS
  Filled 2012-06-27: qty 2

## 2012-06-27 NOTE — ED Notes (Signed)
Pt has ride home.

## 2012-06-27 NOTE — ED Notes (Signed)
Pt returned from CT °

## 2012-07-01 ENCOUNTER — Ambulatory Visit (INDEPENDENT_AMBULATORY_CARE_PROVIDER_SITE_OTHER): Payer: No Typology Code available for payment source | Admitting: Medical

## 2012-07-01 ENCOUNTER — Encounter: Payer: Self-pay | Admitting: Medical

## 2012-07-01 VITALS — BP 130/80 | HR 60 | Temp 97.8°F | Resp 16 | Wt 232.0 lb

## 2012-07-01 DIAGNOSIS — M549 Dorsalgia, unspecified: Secondary | ICD-10-CM

## 2012-07-01 DIAGNOSIS — M542 Cervicalgia: Secondary | ICD-10-CM

## 2012-07-01 DIAGNOSIS — R51 Headache: Secondary | ICD-10-CM

## 2012-07-01 MED ORDER — TRAMADOL HCL 50 MG PO TABS: 50.0000 mg | ORAL_TABLET | Freq: Four times a day (QID) | ORAL | Status: DC | PRN

## 2012-07-01 MED ORDER — CYCLOBENZAPRINE HCL 10 MG PO TABS: ORAL_TABLET | ORAL | Status: DC
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
## 2012-07-03 ENCOUNTER — Telehealth: Payer: Self-pay | Admitting: Family Medicine

## 2012-07-03 NOTE — Telephone Encounter (Signed)
Message copied by Janeice Robinson on Fri Jul 03, 2012  1:29 PM ------      Message from: Jac Canavan      Created: Wed Jul 01, 2012  8:18 PM       PT referral ------

## 2012-07-03 NOTE — Telephone Encounter (Signed)
Patients information was sent over to [facility]. They will contact her to schedule the appointment. CLS

## 2012-07-15 ENCOUNTER — Telehealth: Payer: Self-pay | Admitting: Family Medicine

## 2012-07-15 NOTE — Telephone Encounter (Signed)
Patient would like to know when does she need to return for a OV for her thyroid. CLS

## 2012-07-16 ENCOUNTER — Other Ambulatory Visit: Payer: Self-pay | Admitting: Medical

## 2012-07-16 DIAGNOSIS — E039 Hypothyroidism, unspecified: Secondary | ICD-10-CM

## 2012-07-16 NOTE — Telephone Encounter (Signed)
I left a message for the patient about her lab visit. CLS

## 2012-07-16 NOTE — Telephone Encounter (Signed)
Now would be a good time.  Labs are in epic standing.  Return for lab draw

## 2012-07-27 ENCOUNTER — Other Ambulatory Visit: Payer: Self-pay | Admitting: Medical

## 2012-07-27 ENCOUNTER — Encounter: Payer: Self-pay | Admitting: Medical

## 2012-07-27 ENCOUNTER — Ambulatory Visit (INDEPENDENT_AMBULATORY_CARE_PROVIDER_SITE_OTHER): Payer: No Typology Code available for payment source | Admitting: Medical

## 2012-07-27 VITALS — BP 110/78 | HR 88 | Temp 97.6°F | Resp 16 | Wt 235.0 lb

## 2012-07-27 DIAGNOSIS — L509 Urticaria, unspecified: Secondary | ICD-10-CM

## 2012-07-27 DIAGNOSIS — E039 Hypothyroidism, unspecified: Secondary | ICD-10-CM

## 2012-07-27 DIAGNOSIS — M79609 Pain in unspecified limb: Secondary | ICD-10-CM
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
## 2012-07-29 LAB — ALLERGY FULL PROFILE
Allergen, D pternoyssinus,d7: <0.1 kU/L
Allergen,Goose feathers, e70: <0.1 kU/L
Bermuda Grass: <0.1 kU/L
Box Elder IgE: <0.1 kU/L
Common Ragweed: 0.24 kU/L — ABNORMAL HIGH
Dog Dander: <0.1 kU/L
G005 Rye, Perennial: <0.1 kU/L
Goldenrod: <0.1 kU/L
Helminthosporium halodes: <0.1 kU/L
House Dust Hollister: <0.1 kU/L
IgE (Immunoglobulin E), Serum: 75.1 IU/mL (ref 0.0–180.0)
Plantain: <0.1 kU/L
Stemphylium Botryosum: <0.1 kU/L

## 2012-08-13 ENCOUNTER — Telehealth: Payer: Self-pay | Admitting: Internal Medicine

## 2012-08-13 DIAGNOSIS — Z0289 Encounter for other administrative examinations: Secondary | ICD-10-CM

## 2012-08-13 NOTE — Telephone Encounter (Signed)
Faxed medical records to [facility] @ attorneys at law @ [phone].

## 2012-10-12 ENCOUNTER — Ambulatory Visit
Admission: RE | Admit: 2012-10-12 | Discharge: 2012-10-12 | Disposition: A | Discharge status: Home / Self Care | Payer: PRIVATE HEALTH INSURANCE | Source: Ambulatory Visit | Attending: Family Medicine | Admitting: Family Medicine

## 2012-10-12 ENCOUNTER — Ambulatory Visit (INDEPENDENT_AMBULATORY_CARE_PROVIDER_SITE_OTHER): Payer: No Typology Code available for payment source | Admitting: Family Medicine

## 2012-10-12 ENCOUNTER — Encounter: Payer: Self-pay | Admitting: Family Medicine

## 2012-10-12 VITALS — HR 60 | Resp 16 | Wt 238.0 lb

## 2012-10-12 DIAGNOSIS — R109 Unspecified abdominal pain: Secondary | ICD-10-CM

## 2012-10-12 DIAGNOSIS — Z87442 Personal history of urinary calculi: Secondary | ICD-10-CM

## 2012-10-12 LAB — POCT URINALYSIS DIPSTICK
Bilirubin, UA: NEGATIVE
Glucose, UA: NEGATIVE
Leukocytes, UA: NEGATIVE
Nitrite, UA: NEGATIVE

## 2012-10-12 MED ORDER — KETOROLAC TROMETHAMINE 60 MG/2ML IM SOLN
60.0000 mg | Freq: Once | INTRAMUSCULAR | Status: AC
  Administered 2012-10-12: 60 mg via INTRAMUSCULAR

## 2012-10-12 MED ORDER — TRAMADOL HCL 50 MG PO TABS: 50.0000 mg | ORAL_TABLET | Freq: Four times a day (QID) | ORAL | Status: DC | PRN

## 2012-10-12 NOTE — Progress Notes (Signed)
  Subjective:    Patient ID: Vanessa Berg, female    DOB: 11/12/1960, 52 y.o.   MRN: [id number]  HPI Four days ago she had the onset of right sided pain with radiation down into the pelvic area. She has a previous history of stones and says this is very similar. No fever, chills, dysuria or frequency. Bowel habits of the normal. She does have an allergy to Tylenol.  Review of Systems     Objective:   Physical Exam Alert and in moderate distress and quite pale appearing. Urine dipstick was positive for red cells.        Assessment & Plan:  Right flank pain - Plan: Urinalysis Dipstick, CT Abdomen Pelvis Wo Contrast, traMADol (ULTRAM) 50 MG tablet, ketorolac (TORADOL) injection 60 mg  History of renal stone - Plan: CT Abdomen Pelvis Wo Contrast, ketorolac (TORADOL) injection 60 mg  CT urogram was negative. She is to keep in touch with me concerning her pain. I have a feeling she probably passed a stone Difficult to find a pain med that doesn't have Tylenol. I will give her tramadol.

## 2012-11-09 ENCOUNTER — Encounter: Payer: Self-pay | Admitting: Medical

## 2012-11-09 ENCOUNTER — Ambulatory Visit (INDEPENDENT_AMBULATORY_CARE_PROVIDER_SITE_OTHER): Payer: No Typology Code available for payment source | Admitting: Medical

## 2012-11-09 VITALS — BP 100/70 | HR 78 | Temp 97.7°F | Resp 18 | Ht 62.0 in | Wt 237.0 lb

## 2012-11-09 DIAGNOSIS — R911 Solitary pulmonary nodule: Secondary | ICD-10-CM

## 2012-11-09 DIAGNOSIS — E669 Obesity, unspecified: Secondary | ICD-10-CM

## 2012-11-09 DIAGNOSIS — R7989 Other specified abnormal findings of blood chemistry: Secondary | ICD-10-CM

## 2012-11-09 DIAGNOSIS — E039 Hypothyroidism, unspecified: Secondary | ICD-10-CM

## 2012-11-09 DIAGNOSIS — R51 Headache: Secondary | ICD-10-CM

## 2012-11-09 DIAGNOSIS — Z0000 Encounter for general adult medical examination without abnormal findings: Secondary | ICD-10-CM

## 2012-11-09 DIAGNOSIS — J45909 Unspecified asthma, uncomplicated: Secondary | ICD-10-CM

## 2012-11-09 DIAGNOSIS — K219 Gastro-esophageal reflux disease without esophagitis: Secondary | ICD-10-CM

## 2012-11-09 DIAGNOSIS — IMO0002 Reserved for concepts with insufficient information to code with codable children: Secondary | ICD-10-CM

## 2012-11-09 LAB — CBC WITH DIFFERENTIAL/PLATELET
Basophils Absolute: 0 10*3/uL (ref 0.0–0.1)
Basophils Relative: 0 % (ref 0–1)
Eosinophils Absolute: 0.1 10*3/uL (ref 0.0–0.7)
HCT: 38.4 % (ref 36.0–46.0)
Lymphs Abs: 2 10*3/uL (ref 0.7–4.0)
MCH: 29 pg (ref 26.0–34.0)
MCHC: 34.1 g/dL (ref 30.0–36.0)
Monocytes Absolute: 0.5 10*3/uL (ref 0.1–1.0)
Monocytes Relative: 9 % (ref 3–12)
Neutro Abs: 3 10*3/uL (ref 1.7–7.7)
Neutrophils Relative %: 55 % (ref 43–77)
RBC: 4.52 MIL/uL (ref 3.87–5.11)
RDW: 14.2 % (ref 11.5–15.5)
WBC: 5.6 10*3/uL (ref 4.0–10.5)

## 2012-11-09 LAB — POCT URINALYSIS DIPSTICK
Glucose, UA: NEGATIVE
Leukocytes, UA: NEGATIVE
Nitrite, UA: NEGATIVE
Urobilinogen, UA: NEGATIVE

## 2012-11-09 LAB — HEMOGLOBIN A1C
Hgb A1c MFr Bld: 5.5 % (ref <5.7)
Mean Plasma Glucose: 111 mg/dL (ref <117)

## 2012-11-09 LAB — HEPATIC FUNCTION PANEL
ALT: 27 U/L (ref 0–35)
Indirect Bilirubin: 0.3 mg/dL (ref 0.0–0.9)
Total Protein: 6.6 g/dL (ref 6.0–8.3)

## 2012-11-09 LAB — LIPID PANEL
HDL: 49 mg/dL (ref >39)
LDL Cholesterol: 109 mg/dL — ABNORMAL HIGH (ref 0–99)
Total CHOL/HDL Ratio: 3.7 Ratio
Triglycerides: 117 mg/dL (ref <150)
VLDL: 23 mg/dL (ref 0–40)

## 2012-11-09 LAB — BASIC METABOLIC PANEL
BUN: 11 mg/dL (ref 6–23)
CO2: 27 mEq/L (ref 19–32)
Chloride: 105 mEq/L (ref 96–112)
Creat: 0.75 mg/dL (ref 0.50–1.10)
Potassium: 4.5 mEq/L (ref 3.5–5.3)

## 2012-11-09 LAB — T4, FREE: Free T4: 1.5 ng/dL (ref 0.80–1.80)

## 2012-11-09 LAB — TSH: TSH: 1.455 u[IU]/mL (ref 0.350–4.500)

## 2012-11-09 MED ORDER — LORCASERIN HCL 10 MG PO TABS: 1.0000 | ORAL_TABLET | Freq: Two times a day (BID) | ORAL | Status: DC
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
## 2012-11-10 ENCOUNTER — Telehealth: Payer: Self-pay | Admitting: Medical

## 2012-11-11 ENCOUNTER — Other Ambulatory Visit: Payer: Self-pay | Admitting: Medical

## 2012-11-11 ENCOUNTER — Telehealth: Payer: Self-pay | Admitting: Family Medicine

## 2012-11-11 ENCOUNTER — Encounter: Payer: Self-pay | Admitting: Medical

## 2012-11-11 MED ORDER — OMEPRAZOLE 40 MG PO CPDR: 40.0000 mg | DELAYED_RELEASE_CAPSULE | Freq: Every day | ORAL | Status: DC

## 2012-11-11 MED ORDER — TOPIRAMATE 50 MG PO TABS: 50.0000 mg | ORAL_TABLET | Freq: Two times a day (BID) | ORAL | Status: DC

## 2012-11-11 MED ORDER — FAMOTIDINE 20 MG PO TABS: 20.0000 mg | ORAL_TABLET | Freq: Two times a day (BID) | ORAL | Status: DC

## 2012-11-11 MED ORDER — FUROSEMIDE 20 MG PO TABS: 20.0000 mg | ORAL_TABLET | Freq: Every day | ORAL | Status: DC

## 2012-11-11 MED ORDER — LEVOTHYROXINE SODIUM 200 MCG PO TABS: 200.0000 ug | ORAL_TABLET | Freq: Every day | ORAL | Status: DC

## 2012-11-11 NOTE — Telephone Encounter (Signed)
Message copied by Janeice Robinson on Wed Nov 11, 2012 11:09 AM ------      Message from: Jac Canavan      Created: Mon Nov 09, 2012 10:50 PM       Call out [facility], refer to female gynecologist. ------

## 2012-11-11 NOTE — Telephone Encounter (Signed)
I called the patients medication into her pharmacy per Crosby Oyster PA-C. CLS

## 2012-11-11 NOTE — Telephone Encounter (Signed)
Call in the belviq.   Rest of medications sent.  I though I printed the script while she was here?

## 2012-11-11 NOTE — Telephone Encounter (Signed)
I called out her Belviq to the pharmacy and I fax her referral over to [facility]. CLS

## 2012-12-07 ENCOUNTER — Ambulatory Visit (INDEPENDENT_AMBULATORY_CARE_PROVIDER_SITE_OTHER): Payer: No Typology Code available for payment source | Admitting: Medical

## 2012-12-07 ENCOUNTER — Encounter: Payer: Self-pay | Admitting: Medical

## 2012-12-07 VITALS — BP 112/80 | HR 73 | Temp 97.9°F | Resp 16 | Wt 230.0 lb

## 2012-12-07 DIAGNOSIS — R05 Cough: Secondary | ICD-10-CM

## 2012-12-07 DIAGNOSIS — J111 Influenza due to unidentified influenza virus with other respiratory manifestations: Secondary | ICD-10-CM

## 2012-12-07 DIAGNOSIS — J45909 Unspecified asthma, uncomplicated: Secondary | ICD-10-CM

## 2012-12-07 DIAGNOSIS — R0602 Shortness of breath: Secondary | ICD-10-CM

## 2012-12-07 LAB — POCT INFLUENZA A/B
Influenza A, POC: NEGATIVE
Influenza B, POC: NEGATIVE

## 2012-12-07 NOTE — Progress Notes (Signed)
Subjective: Vanessa Berg with a hx/o asthma is here for illness.  Started Friday with headache and sore throat.  By Saturday body started aching.  Hit her hard.  Yesterday felt worse.  Last night and today worse.  Neck feels stiiff, body hurts all over, coughing, some green sputum.  Chest hurts.   Feels SOB, using inhaler.  Has felt feverish, more at night.  No sick contacts.   Feels nauseated, has head pressure, ear pressure.  Denies vomiting, diarrhea.  No other aggravating or relieving factors.  No other c/o.  The following portions of the patient's history were reviewed and updated as appropriate: allergies, current medications, past medical history, past social history and problem list.  ROS as in subjective    Objective: BP 112/80  Pulse 73  Temp(Src) 97.9 F (36.6 C) (Oral)  Resp 16  Wt 230 lb (104.327 kg)   General: Ill-appearing, well-developed, well-nourished Skin: warm, dry HEENT: Nose inflamed and congested, clear conjunctiva, TMs pearly, no sinus tenderness, pharynx with mild erythema, no exudates Neck: Supple, nontender, shotty cervical adenopathy Heart: Regular rate and rhythm, normal S1, S2, no murmurs Lungs: Clear to auscultation bilaterally, no wheezes, rales, rhonchi Extremities: Mild generalized tenderness   Assessment and Plan: Encounter Diagnoses  Name Primary?  . Influenza Yes  . Cough   . SOB (shortness of breath)   . Unspecified asthma(493.90)    Although flu test negative, her exam and history is most indicative of flu or flu like illness.  Discussed supportive care including rest, hydration, and malaise.  Discussed period of contagion, self quarantine at home away from others to avoid spread of disease, discussed means of transmission, and possible complications including pneumonia.  If worse or not improving within the next 4-5 days, then call or return.  Gave note for work.

## 2012-12-07 NOTE — Patient Instructions (Signed)
Use your inhaler as needed.   Use OTC Benadryl, Zyrtec, or other that you can tolerate for cough/congestion.  Delsym is safe for cough.  Rest, hydrate well.  Call if worse productive sputum, fever, having trouble breathing.  Influenza, Adult Influenza ("the flu") is a viral infection of the respiratory tract. It causes chills, fever, cough, headache, body aches, and sore throat. Influenza in general will make you feel sicker than when you have a common cold. Symptoms of the illness typically last a few days. Cough and fatigue may continue for as long as 7 to 10 days. Influenza is highly contagious. It spreads easily to others in the droplets from coughs and sneezes. People frequently become infected by touching something that was recently contaminated with the virus and then touch their mouth, nose or eyes. This infection is caused by a virus. Symptoms will not be reduced or improved by taking an antibiotic. Antibiotics are medications that kill bacteria, not viruses. DIAGNOSIS  Diagnosis of influenza is often made based on the history and physical examination as well as the presence of influenza reports occurring in your community. Testing can be done if the diagnosis is not certain. TREATMENT  Since influenza is caused by a virus, antibiotics are not helpful. Your caregiver may prescribe antiviral medicines to shorten the illness and lessen the severity. Your caregiver may also recommend influenza vaccination and/or antiviral medicines for your family members in order to prevent the spread of influenza to them. HOME CARE INSTRUCTIONS  DO NOT GIVE ASPIRIN TO PERSONS WITH INFLUENZA WHO ARE UNDER 63 YEARS OF AGE. This could lead to brain and liver damage (Reye's syndrome). Read the label on over-the-counter medicines.   Stay home from work or school if at all possible until most of your symptoms are gone.   Only take over-the-counter or prescription medicines for pain, discomfort, or fever as directed  by your caregiver.   Use a cool mist humidifier to increase air moisture. This will make breathing easier.   Rest until your temperature is nearly normal: 98.6 F (37 C). This usually takes 3 to 4 days. Be sure you get plenty of rest.   Drink at least eight, eight-ounce glasses of fluids per day. Fluids include water, juice, broth, gelatin, or lemonade.   Cover your mouth and nose when coughing or sneezing and wash your hands often to prevent the spread of this virus to other persons.  PREVENTION  Annual influenza vaccination (flu shots) is the best way to avoid getting influenza. An annual flu shot is now routinely recommended for all adults in the [place] SEEK MEDICAL CARE IF:   You develop shortness of breath while resting.   You have a deep cough with production of mucous or chest pain.   You develop nausea (feeling sick to your stomach), vomiting, or diarrhea.  SEEK IMMEDIATE MEDICAL CARE IF:   You have difficulty breathing, become short of breath, or your skin or nails turn bluish.   You develop severe neck pain or stiffness.   You develop a severe headache, facial pain, or earache.   You have a fever.   You develop nausea or vomiting that cannot be controlled.  Document Released: 01/12/2000 Document Revised: 09/26/2010 Document Reviewed: 11/16/2008 [facility] Patient Information 2012 [place], [place].

## 2012-12-10 ENCOUNTER — Encounter: Payer: Self-pay | Admitting: Medical

## 2012-12-21 ENCOUNTER — Other Ambulatory Visit: Payer: Self-pay | Admitting: Obstetrics & Gynecology

## 2012-12-21 DIAGNOSIS — IMO0002 Reserved for concepts with insufficient information to code with codable children: Secondary | ICD-10-CM

## 2012-12-22 ENCOUNTER — Ambulatory Visit
Admission: RE | Admit: 2012-12-22 | Discharge: 2012-12-22 | Disposition: A | Discharge status: Home / Self Care | Payer: PRIVATE HEALTH INSURANCE | Source: Ambulatory Visit | Attending: Obstetrics & Gynecology | Admitting: Obstetrics & Gynecology

## 2012-12-22 DIAGNOSIS — IMO0002 Reserved for concepts with insufficient information to code with codable children: Secondary | ICD-10-CM

## 2013-01-26 ENCOUNTER — Other Ambulatory Visit: Payer: Self-pay | Admitting: Medical

## 2013-01-29 ENCOUNTER — Ambulatory Visit (INDEPENDENT_AMBULATORY_CARE_PROVIDER_SITE_OTHER): Payer: No Typology Code available for payment source | Admitting: Medical

## 2013-01-29 ENCOUNTER — Encounter: Payer: Self-pay | Admitting: Medical

## 2013-01-29 VITALS — BP 110/80 | HR 55 | Temp 97.8°F | Resp 20 | Wt 235.0 lb

## 2013-01-29 DIAGNOSIS — T7840XS Allergy, unspecified, sequela: Secondary | ICD-10-CM

## 2013-01-29 DIAGNOSIS — T7589XS Other specified effects of external causes, sequela: Secondary | ICD-10-CM

## 2013-01-29 DIAGNOSIS — T788XXS Other adverse effects, not elsewhere classified, sequela: Secondary | ICD-10-CM

## 2013-01-29 MED ORDER — PREDNISONE 20 MG PO TABS: ORAL_TABLET | ORAL | Status: DC

## 2013-01-29 NOTE — Patient Instructions (Signed)
Begin short course of Prednisone 20mg , 2 tablets daily for 3 days.    Begin OTC Benadryl 25mg , 1/2-1 tablet every 4-6 hours for the next few days  Have her husband go back and clean the bath area with water or soap and water alone.  Continue your regular medications.  You may want to let your allergist know you were seen for early signs/symptoms of allergic reaction, but no definite angioedema or hives.  If lip, tongue, significant swelling, use Epipen and go the emergency department.
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
## 2013-02-16 ENCOUNTER — Ambulatory Visit
Admission: RE | Admit: 2013-02-16 | Discharge: 2013-02-16 | Disposition: A | Discharge status: Home / Self Care | Payer: PRIVATE HEALTH INSURANCE | Source: Ambulatory Visit | Attending: Specialist | Admitting: Specialist

## 2013-02-16 ENCOUNTER — Other Ambulatory Visit: Payer: Self-pay | Admitting: Specialist

## 2013-02-16 DIAGNOSIS — R911 Solitary pulmonary nodule: Secondary | ICD-10-CM

## 2013-02-19 ENCOUNTER — Ambulatory Visit (INDEPENDENT_AMBULATORY_CARE_PROVIDER_SITE_OTHER): Payer: PRIVATE HEALTH INSURANCE | Admitting: Family Medicine

## 2013-02-19 ENCOUNTER — Encounter: Payer: Self-pay | Admitting: Family Medicine

## 2013-02-19 ENCOUNTER — Telehealth: Payer: Self-pay | Admitting: Medical

## 2013-02-19 VITALS — BP 118/72 | HR 72 | Temp 97.9°F | Ht 62.0 in | Wt 236.0 lb

## 2013-02-19 DIAGNOSIS — R52 Pain, unspecified: Secondary | ICD-10-CM

## 2013-02-19 DIAGNOSIS — R059 Cough, unspecified: Secondary | ICD-10-CM

## 2013-02-19 DIAGNOSIS — J069 Acute upper respiratory infection, unspecified: Secondary | ICD-10-CM

## 2013-02-19 DIAGNOSIS — R509 Fever, unspecified: Secondary | ICD-10-CM

## 2013-02-19 DIAGNOSIS — R05 Cough: Secondary | ICD-10-CM

## 2013-02-19 LAB — POC INFLUENZA A&B (BINAX/QUICKVUE)
INFLUENZA A, POC: NEGATIVE
INFLUENZA B, POC: NEGATIVE

## 2013-02-19 NOTE — Patient Instructions (Signed)
Drink plenty of fluids. Try sinus rinses (neti-pot or sinus rinse kit) up to twice daily Try Guaifenesin (ie plain Mucinex, where this is the only ingredient). Continue delsym syrup as needed for cough. Sleep with head of bed elevated (extra pillows).  Continue your current allergy and asthma medications.  Use tramadol or aspirin if pain is worse.  Heat/hot showers/heating pad also helps.  Call next week if fevers persist/worsen, increasing sinus pain, discolored mucus, for antibiotics for sinus infection. If your respiratory symptoms worsen--having shortness of breath, etc, then I'd prefer you return for re-evaluation
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
## 2013-02-24 ENCOUNTER — Telehealth: Payer: Self-pay | Admitting: Family Medicine

## 2013-02-24 ENCOUNTER — Other Ambulatory Visit: Payer: Self-pay | Admitting: *Deleted

## 2013-02-24 MED ORDER — AMOXICILLIN 500 MG PO CAPS: 1000.0000 mg | ORAL_CAPSULE | Freq: Two times a day (BID) | ORAL | Status: DC

## 2013-02-24 MED ORDER — BENZONATATE 200 MG PO CAPS
200.0000 mg | ORAL_CAPSULE | Freq: Three times a day (TID) | ORAL | Status: DC | PRN
Start: 2013-02-24 — End: 2013-05-11

## 2013-02-24 NOTE — Telephone Encounter (Signed)
Find out if she is having fevers or discolored mucus.  If she isn't having any discolored mucus or fever, and is just asking for another cough medication okay for tessalong 200mg  TID prn cough #30.  Continue guaifenesin to loose any thick phlegm or mucus.  If she is having discolored mucus or fever, then rx amoxacillin 1000mg  (two 500mg  tabs) BID (500mg  #40)

## 2013-02-24 NOTE — Telephone Encounter (Signed)
Not feeling any better Cough, cong  Tried mucinex  Delsym  No help at all, she has tried regular and DM  ?Rx   Please call

## 2013-02-26 ENCOUNTER — Telehealth: Payer: Self-pay | Admitting: Family Medicine

## 2013-03-03 NOTE — Telephone Encounter (Signed)
tsd  °

## 2013-03-15 NOTE — Telephone Encounter (Signed)
lm

## 2013-03-18 ENCOUNTER — Other Ambulatory Visit: Payer: Self-pay | Admitting: Medical

## 2013-03-18 NOTE — Telephone Encounter (Signed)
Is this oksy to refill?

## 2013-03-25 ENCOUNTER — Telehealth: Payer: Self-pay | Admitting: Internal Medicine

## 2013-03-25 NOTE — Telephone Encounter (Signed)
Faxed over medical records to [facility] law firm

## 2013-04-15 ENCOUNTER — Other Ambulatory Visit: Payer: Self-pay | Admitting: Medical

## 2013-04-15 NOTE — Telephone Encounter (Signed)
Ok to RF? WGL

## 2013-05-11 ENCOUNTER — Ambulatory Visit (INDEPENDENT_AMBULATORY_CARE_PROVIDER_SITE_OTHER): Payer: PRIVATE HEALTH INSURANCE | Admitting: Medical

## 2013-05-11 ENCOUNTER — Encounter: Payer: Self-pay | Admitting: Medical

## 2013-05-11 VITALS — BP 120/80 | HR 80 | Temp 97.7°F | Resp 16 | Wt 240.0 lb

## 2013-05-11 DIAGNOSIS — J329 Chronic sinusitis, unspecified: Secondary | ICD-10-CM

## 2013-05-11 DIAGNOSIS — J309 Allergic rhinitis, unspecified: Secondary | ICD-10-CM

## 2013-05-11 DIAGNOSIS — J45909 Unspecified asthma, uncomplicated: Secondary | ICD-10-CM

## 2013-05-11 MED ORDER — AZELASTINE-FLUTICASONE 137-50 MCG/ACT NA SUSP: 1.0000 | Freq: Two times a day (BID) | NASAL | Status: DC

## 2013-05-11 MED ORDER — AMOXICILLIN-POT CLAVULANATE 875-125 MG PO TABS: 1.0000 | ORAL_TABLET | Freq: Two times a day (BID) | ORAL | Status: DC

## 2013-05-11 MED ORDER — METHYLPREDNISOLONE (PAK) 4 MG PO TABS: ORAL_TABLET | ORAL | Status: DC
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
## 2013-05-11 NOTE — Patient Instructions (Signed)
  Thank you for giving me the opportunity to serve you today.    Your diagnosis today includes: Encounter Diagnoses  Name Primary?  Marland Kitchen Asthmatic bronchitis Yes  . Allergic rhinitis   . Sinusitis      Specific recommendations today include:  Begin Medrol steroid Dosepak for inflammation in the lung  Continue Dulera one inhalation twice daily  Continue albuterol as needed for shortness of breath  Begin Augmentin antibiotic  Begin trial of Dymista nasal spray for allergies  Return in 2 weeks for recheck and routine labs

## 2013-07-21 ENCOUNTER — Ambulatory Visit (INDEPENDENT_AMBULATORY_CARE_PROVIDER_SITE_OTHER): Payer: PRIVATE HEALTH INSURANCE | Admitting: Medical

## 2013-07-21 ENCOUNTER — Encounter: Payer: Self-pay | Admitting: Medical

## 2013-07-21 VITALS — BP 110/80 | HR 88 | Temp 97.8°F | Resp 16 | Wt 243.0 lb

## 2013-07-21 DIAGNOSIS — R5383 Other fatigue: Principal | ICD-10-CM

## 2013-07-21 DIAGNOSIS — F411 Generalized anxiety disorder: Secondary | ICD-10-CM

## 2013-07-21 DIAGNOSIS — E669 Obesity, unspecified: Secondary | ICD-10-CM

## 2013-07-21 DIAGNOSIS — R609 Edema, unspecified: Secondary | ICD-10-CM

## 2013-07-21 DIAGNOSIS — G2581 Restless legs syndrome: Secondary | ICD-10-CM

## 2013-07-21 DIAGNOSIS — J45909 Unspecified asthma, uncomplicated: Secondary | ICD-10-CM

## 2013-07-21 DIAGNOSIS — R51 Headache: Secondary | ICD-10-CM

## 2013-07-21 DIAGNOSIS — E039 Hypothyroidism, unspecified: Secondary | ICD-10-CM

## 2013-07-21 DIAGNOSIS — G8929 Other chronic pain: Secondary | ICD-10-CM

## 2013-07-21 DIAGNOSIS — R5381 Other malaise: Secondary | ICD-10-CM

## 2013-07-21 LAB — CBC WITH DIFFERENTIAL/PLATELET
Basophils Absolute: 0 10*3/uL (ref 0.0–0.1)
Basophils Relative: 0 % (ref 0–1)
Eosinophils Absolute: 0.1 10*3/uL (ref 0.0–0.7)
Eosinophils Relative: 1 % (ref 0–5)
HEMATOCRIT: 40.7 % (ref 36.0–46.0)
HEMOGLOBIN: 13.7 g/dL (ref 12.0–15.0)
LYMPHS PCT: 30 % (ref 12–46)
Lymphs Abs: 2.5 10*3/uL (ref 0.7–4.0)
MCH: 29.1 pg (ref 26.0–34.0)
MCHC: 33.7 g/dL (ref 30.0–36.0)
MCV: 86.4 fL (ref 78.0–100.0)
MONO ABS: 0.7 10*3/uL (ref 0.1–1.0)
Monocytes Relative: 8 % (ref 3–12)
NEUTROS ABS: 5.1 10*3/uL (ref 1.7–7.7)
NEUTROS PCT: 61 % (ref 43–77)
Platelets: 306 10*3/uL (ref 150–400)
RBC: 4.71 MIL/uL (ref 3.87–5.11)
RDW: 13.7 % (ref 11.5–15.5)
WBC: 8.4 10*3/uL (ref 4.0–10.5)

## 2013-07-21 LAB — HEMOGLOBIN A1C
HEMOGLOBIN A1C: 5.5 % (ref <5.7)
MEAN PLASMA GLUCOSE: 111 mg/dL (ref <117)

## 2013-07-21 LAB — COMPREHENSIVE METABOLIC PANEL
ALBUMIN: 4 g/dL (ref 3.5–5.2)
ALT: 17 U/L (ref 0–35)
AST: 24 U/L (ref 0–37)
Alkaline Phosphatase: 121 U/L — ABNORMAL HIGH (ref 39–117)
BUN: 12 mg/dL (ref 6–23)
CHLORIDE: 103 meq/L (ref 96–112)
CO2: 27 mEq/L (ref 19–32)
Calcium: 9.1 mg/dL (ref 8.4–10.5)
Creat: 0.84 mg/dL (ref 0.50–1.10)
Glucose, Bld: 71 mg/dL (ref 70–99)
POTASSIUM: 4.6 meq/L (ref 3.5–5.3)
Sodium: 139 mEq/L (ref 135–145)
Total Bilirubin: 0.6 mg/dL (ref 0.2–1.2)
Total Protein: 6.9 g/dL (ref 6.0–8.3)

## 2013-07-21 LAB — LIPID PANEL
CHOL/HDL RATIO: 3.3 ratio
Cholesterol: 194 mg/dL (ref 0–200)
HDL: 59 mg/dL (ref >39)
LDL Cholesterol: 117 mg/dL — ABNORMAL HIGH (ref 0–99)
Triglycerides: 91 mg/dL (ref <150)
VLDL: 18 mg/dL (ref 0–40)

## 2013-07-21 MED ORDER — FAMOTIDINE 20 MG PO TABS: 20.0000 mg | ORAL_TABLET | Freq: Two times a day (BID) | ORAL | Status: DC

## 2013-07-21 MED ORDER — ALBUTEROL SULFATE HFA 108 (90 BASE) MCG/ACT IN AERS: 1.0000 | INHALATION_SPRAY | Freq: Four times a day (QID) | RESPIRATORY_TRACT | Status: DC | PRN

## 2013-07-21 MED ORDER — MOMETASONE FURO-FORMOTEROL FUM 100-5 MCG/ACT IN AERO: 2.0000 | INHALATION_SPRAY | Freq: Two times a day (BID) | RESPIRATORY_TRACT | Status: DC

## 2013-07-21 MED ORDER — AZELASTINE-FLUTICASONE 137-50 MCG/ACT NA SUSP: 1.0000 | Freq: Two times a day (BID) | NASAL | Status: DC

## 2013-07-21 MED ORDER — TOPIRAMATE 50 MG PO TABS: 50.0000 mg | ORAL_TABLET | Freq: Two times a day (BID) | ORAL | Status: DC

## 2013-07-21 MED ORDER — OMEPRAZOLE 40 MG PO CPDR: 40.0000 mg | DELAYED_RELEASE_CAPSULE | Freq: Every day | ORAL | Status: DC

## 2013-07-21 MED ORDER — ROPINIROLE HCL 0.5 MG PO TABS: 0.5000 mg | ORAL_TABLET | Freq: Every day | ORAL | Status: DC

## 2013-07-21 MED ORDER — ASPIRIN 81 MG PO TABS
81.0000 mg | ORAL_TABLET | Freq: Every day | ORAL | Status: DC
Start: 2013-07-21 — End: 2013-07-22

## 2013-07-21 MED ORDER — LORCASERIN HCL 10 MG PO TABS: 1.0000 | ORAL_TABLET | Freq: Two times a day (BID) | ORAL | Status: DC

## 2013-07-21 MED ORDER — LEVOTHYROXINE SODIUM 200 MCG PO TABS: 200.0000 ug | ORAL_TABLET | Freq: Every day | ORAL | Status: DC

## 2013-07-21 MED ORDER — FUROSEMIDE 20 MG PO TABS: 20.0000 mg | ORAL_TABLET | Freq: Every day | ORAL | Status: DC

## 2013-07-21 NOTE — Progress Notes (Signed)
Subjective: Patient is a 53 y.o. female presenting today for routine follow up and thyroid check, several concerns.   Thyroid - reports medication compliance. Reports feeling fatigue all the time, difficulty sleeping, mood changes, feels down, feels a bit restless, dry skin, swelling in lower legs, some discomfort not pain when swallowing. She does admit dealing with a lot of stress with her family. Denies sob, cp, heart racing, abdominal pain, goiter.  Obesity - patient reports that she had started Belviq, took it for 15 days and felt that it was working for her. Unfortunately, her insurance stopped covering the medication. Wants to discuss her options for these medications. Reports eating healthy including salads, veggies, no fried foods, cut out sodas, drinks plenty of water. She also exercises doing some walking, uses a glider, 15 minutes each morning.  OB/GYN - s/p hysterectomy, reports that she went to the gyn referral about 4 months ago. She has had hot flashes, vaginal dryness, itching. Was given miracle cream with significant improvement in her vaginal symptoms. Plans on going back for follow up. She denies being sexually active.   H/A - has not had a migraine since her last visit. She continues to have intermittent headaches, at most 2 a week, but she notes significant improvement. Wants to know if she should increase her Topamax medication.  Edema - notices that her legs are swollen throughout the day. Denies being on her feet a lot, she sits at work. She reports that she was taking the Lasix and was helping her with the swelling but she has not taken in the last month since she ran out. Would like a refill on this medication.  Pain in her thighs - followed by orthopedist. Reports that she has significant arthritis and recently received steroid shot last Wednesday. She started to have pain in her thighs for the last month that radiates to her hip.  She is supposed to follow up her  orthopedist again soon.  Husband says she "runs" in her sleep, legs restless all through the night.   Interferes with sleep.  Anxiety - has felt stress and difficulty sleeping. Would like to talk with [facility] specifically about getting something to help her sleep.  GERD - feels reflux with anything that she eats. She is taking both medications to help her with this but continues to have ongoing issues. She also reports intermittent nausea without vomiting and loose stools. Denies abdominal pain, epigastric pain but some discomfort with swallowing. Has previously had EGDs done and wants to know if she should have another.  No other questions or concerns at this visit.  ROS as in subjective.   Objective:  BP 110/80  Pulse 88  Temp(Src) 97.8 F (36.6 C) (Oral)  Resp 16  Wt 243 lb (110.224 kg)   General appearance: alert, no distress, WD/WN HEENT: normocephalic, sclerae anicteric, TMs pearly, nares patent, no discharge or erythema, pharynx normal Oral cavity: MMM, no lesions Neck: supple, no lymphadenopathy, no thyromegaly, no masses Heart: RRR, normal S1, S2, no murmurs Lungs: CTA bilaterally, no wheezes, rhonchi, or rales Abdomen: +bs, soft, non tender, non distended, no masses, no hepatomegaly Pulses: 2+ symmetric, upper and lower extremities, normal cap refill, trace edema in lower legs bilaterally   Assessment:  Encounter Diagnoses  Name Primary?  . Other malaise and fatigue Yes  . Obesity, unspecified   . Unspecified hypothyroidism   . Unspecified asthma(493.90)   . RLS (restless legs syndrome)   . Generalized anxiety disorder   .  Chronic nonintractable headache, unspecified headache type   . Edema      Plan: Fatigue - routine labs today.  Discussed her concerns . C/t same medications in general.   Obesity - begin back on Belviq, c/t weight loss efforts. hypothyroidism - labs today, c/t same medication Asthma - seems to be doing well on Dulera, albuterol prn. RLS -  Begin trial of requip for RLS and insomnia.  Discussed risks/benefits of medication. Anxiety - discussed her concerns, discussed ways to cope and deal with her stressors. Headache - c/t Topamax Edema - restart Lasix daily  Patient was seen in conjunction with PA student Jaynee Eagles, and I have also evaluated and examined patient, agree with student's notes, student supervised by me.

## 2013-07-22 ENCOUNTER — Telehealth: Payer: Self-pay | Admitting: Medical

## 2013-07-22 ENCOUNTER — Other Ambulatory Visit: Payer: Self-pay | Admitting: *Deleted

## 2013-07-22 ENCOUNTER — Other Ambulatory Visit: Payer: Self-pay | Admitting: Medical

## 2013-07-22 LAB — TSH: TSH: 41.431 u[IU]/mL — AB (ref 0.350–4.500)

## 2013-07-22 LAB — T4, FREE: Free T4: 1.21 ng/dL (ref 0.80–1.80)

## 2013-07-22 MED ORDER — ROPINIROLE HCL 0.5 MG PO TABS: 0.5000 mg | ORAL_TABLET | Freq: Every day | ORAL | Status: DC

## 2013-07-22 MED ORDER — LIOTHYRONINE SODIUM 5 MCG PO TABS: 5.0000 ug | ORAL_TABLET | Freq: Every day | ORAL | Status: DC

## 2013-07-22 MED ORDER — TOPIRAMATE 50 MG PO TABS: 50.0000 mg | ORAL_TABLET | Freq: Two times a day (BID) | ORAL | Status: DC

## 2013-07-22 MED ORDER — ASPIRIN 81 MG PO TABS: 81.0000 mg | ORAL_TABLET | Freq: Every day | ORAL | Status: DC

## 2013-07-22 MED ORDER — MOMETASONE FURO-FORMOTEROL FUM 100-5 MCG/ACT IN AERO: 2.0000 | INHALATION_SPRAY | Freq: Two times a day (BID) | RESPIRATORY_TRACT | Status: DC

## 2013-07-22 MED ORDER — AZELASTINE-FLUTICASONE 137-50 MCG/ACT NA SUSP: 1.0000 | Freq: Two times a day (BID) | NASAL | Status: DC

## 2013-07-22 MED ORDER — OMEPRAZOLE 40 MG PO CPDR: 40.0000 mg | DELAYED_RELEASE_CAPSULE | Freq: Every day | ORAL | Status: DC

## 2013-07-22 MED ORDER — FAMOTIDINE 20 MG PO TABS: 20.0000 mg | ORAL_TABLET | Freq: Two times a day (BID) | ORAL | Status: DC

## 2013-07-22 MED ORDER — LORCASERIN HCL 10 MG PO TABS: 1.0000 | ORAL_TABLET | Freq: Two times a day (BID) | ORAL | Status: DC

## 2013-07-22 MED ORDER — FUROSEMIDE 20 MG PO TABS: 20.0000 mg | ORAL_TABLET | Freq: Every day | ORAL | Status: DC

## 2013-07-22 MED ORDER — LEVOTHYROXINE SODIUM 200 MCG PO TABS: 200.0000 ug | ORAL_TABLET | Freq: Every day | ORAL | Status: DC

## 2013-07-22 MED ORDER — ALBUTEROL SULFATE HFA 108 (90 BASE) MCG/ACT IN AERS: 1.0000 | INHALATION_SPRAY | Freq: Four times a day (QID) | RESPIRATORY_TRACT | Status: DC | PRN

## 2013-07-22 MED ORDER — LIOTHYRONINE SODIUM 5 MCG PO TABS
5.0000 ug | ORAL_TABLET | Freq: Every day | ORAL | Status: DC
Start: 2013-07-22 — End: 2013-12-14

## 2013-07-22 NOTE — Telephone Encounter (Signed)
Pt called upset because none of her meds were at the [facility] in [place] she recv'd a phone call from [place] that all were sent in.  I checked and they all went to wrong pharmacy [facility].  All meds were discontinued at [facility] and sent to [facility].  I apologized to pt for the mix up.

## 2013-07-23 NOTE — Telephone Encounter (Signed)
This was done yesterday.  

## 2013-08-04 ENCOUNTER — Other Ambulatory Visit: Payer: Self-pay | Admitting: Orthopaedic Surgery

## 2013-08-04 DIAGNOSIS — M25562 Pain in left knee: Secondary | ICD-10-CM

## 2013-08-05 ENCOUNTER — Ambulatory Visit
Admission: RE | Admit: 2013-08-05 | Discharge: 2013-08-05 | Disposition: A | Discharge status: Home / Self Care | Payer: PRIVATE HEALTH INSURANCE | Source: Ambulatory Visit | Attending: Orthopaedic Surgery | Admitting: Orthopaedic Surgery

## 2013-08-05 DIAGNOSIS — M25562 Pain in left knee: Secondary | ICD-10-CM

## 2013-09-07 ENCOUNTER — Other Ambulatory Visit: Payer: Self-pay | Admitting: Orthopaedic Surgery

## 2013-09-07 DIAGNOSIS — M545 Low back pain: Secondary | ICD-10-CM

## 2013-09-11 ENCOUNTER — Other Ambulatory Visit: Payer: PRIVATE HEALTH INSURANCE

## 2013-09-25 ENCOUNTER — Other Ambulatory Visit: Payer: Self-pay | Admitting: Medical

## 2013-10-07 ENCOUNTER — Ambulatory Visit
Admission: RE | Admit: 2013-10-07 | Discharge: 2013-10-07 | Disposition: A | Discharge status: Home / Self Care | Payer: PRIVATE HEALTH INSURANCE | Source: Ambulatory Visit | Attending: Orthopaedic Surgery | Admitting: Orthopaedic Surgery

## 2013-10-07 DIAGNOSIS — M545 Low back pain: Secondary | ICD-10-CM

## 2013-10-20 ENCOUNTER — Telehealth: Payer: Self-pay | Admitting: Medical

## 2013-10-20 NOTE — Telephone Encounter (Signed)
Her OBGYN  At [facility] was  Supposed to get in contact with you about her hot flashes. She wants to know if you have heard from them. She has scheduled her CPE with you for 11/11/13  Patient is aware that you are out of office this week

## 2013-10-24 NOTE — Telephone Encounter (Signed)
I received 07/2013 notes from gyn.   Have her f/u as planned and we can discuss options that gyn recommended in relation to her other medications and possible contraindications (paroxetine vs Belviq).

## 2013-10-25 NOTE — Telephone Encounter (Signed)
LMOM TO CB. CLS 

## 2013-10-25 NOTE — Telephone Encounter (Signed)
Patient is aware of the message and she is coming in for a visit on 11/11/13. CLS

## 2013-11-03 ENCOUNTER — Other Ambulatory Visit: Payer: Self-pay | Admitting: Orthopaedic Surgery

## 2013-11-03 DIAGNOSIS — M25571 Pain in right ankle and joints of right foot: Secondary | ICD-10-CM

## 2013-11-04 ENCOUNTER — Ambulatory Visit
Admission: RE | Admit: 2013-11-04 | Discharge: 2013-11-04 | Disposition: A | Discharge status: Home / Self Care | Payer: PRIVATE HEALTH INSURANCE | Source: Ambulatory Visit | Attending: Orthopaedic Surgery | Admitting: Orthopaedic Surgery

## 2013-11-04 DIAGNOSIS — M25571 Pain in right ankle and joints of right foot: Secondary | ICD-10-CM

## 2013-11-11 ENCOUNTER — Encounter: Payer: Self-pay | Admitting: Medical

## 2013-11-11 ENCOUNTER — Telehealth: Payer: Self-pay | Admitting: Medical

## 2013-11-11 ENCOUNTER — Ambulatory Visit (INDEPENDENT_AMBULATORY_CARE_PROVIDER_SITE_OTHER): Payer: PRIVATE HEALTH INSURANCE | Admitting: Medical

## 2013-11-11 VITALS — BP 116/70 | HR 60 | Temp 97.8°F | Resp 16 | Ht 61.5 in | Wt 236.0 lb

## 2013-11-11 DIAGNOSIS — M797 Fibromyalgia: Secondary | ICD-10-CM

## 2013-11-11 DIAGNOSIS — J4540 Moderate persistent asthma, uncomplicated: Secondary | ICD-10-CM

## 2013-11-11 DIAGNOSIS — R519 Headache, unspecified: Secondary | ICD-10-CM

## 2013-11-11 DIAGNOSIS — Z87898 Personal history of other specified conditions: Secondary | ICD-10-CM

## 2013-11-11 DIAGNOSIS — M2550 Pain in unspecified joint: Secondary | ICD-10-CM

## 2013-11-11 DIAGNOSIS — E669 Obesity, unspecified: Secondary | ICD-10-CM

## 2013-11-11 DIAGNOSIS — Z8709 Personal history of other diseases of the respiratory system: Secondary | ICD-10-CM

## 2013-11-11 DIAGNOSIS — K219 Gastro-esophageal reflux disease without esophagitis: Secondary | ICD-10-CM

## 2013-11-11 DIAGNOSIS — Z0000 Encounter for general adult medical examination without abnormal findings: Secondary | ICD-10-CM

## 2013-11-11 DIAGNOSIS — R51 Headache: Secondary | ICD-10-CM

## 2013-11-11 DIAGNOSIS — E038 Other specified hypothyroidism: Secondary | ICD-10-CM | POA: Insufficient documentation

## 2013-11-11 DIAGNOSIS — N951 Menopausal and female climacteric states: Secondary | ICD-10-CM

## 2013-11-11 LAB — COMPREHENSIVE METABOLIC PANEL
ALK PHOS: 118 U/L — AB (ref 39–117)
ALT: 29 U/L (ref 0–35)
AST: 28 U/L (ref 0–37)
Albumin: 4.1 g/dL (ref 3.5–5.2)
BUN: 12 mg/dL (ref 6–23)
CO2: 26 mEq/L (ref 19–32)
CREATININE: 0.89 mg/dL (ref 0.50–1.10)
Calcium: 9.7 mg/dL (ref 8.4–10.5)
Chloride: 101 mEq/L (ref 96–112)
Glucose, Bld: 92 mg/dL (ref 70–99)
POTASSIUM: 4.6 meq/L (ref 3.5–5.3)
Sodium: 138 mEq/L (ref 135–145)
Total Bilirubin: 0.5 mg/dL (ref 0.2–1.2)
Total Protein: 7 g/dL (ref 6.0–8.3)

## 2013-11-11 LAB — CBC
HEMATOCRIT: 42.2 % (ref 36.0–46.0)
Hemoglobin: 14.2 g/dL (ref 12.0–15.0)
MCH: 29.3 pg (ref 26.0–34.0)
MCHC: 33.6 g/dL (ref 30.0–36.0)
MCV: 87.2 fL (ref 78.0–100.0)
Platelets: 295 10*3/uL (ref 150–400)
RBC: 4.84 MIL/uL (ref 3.87–5.11)
RDW: 14 % (ref 11.5–15.5)
WBC: 6.3 10*3/uL (ref 4.0–10.5)

## 2013-11-11 LAB — CK: Total CK: 69 U/L (ref 7–177)

## 2013-11-11 LAB — HEMOGLOBIN A1C
Hgb A1c MFr Bld: 5.5 % (ref <5.7)
MEAN PLASMA GLUCOSE: 111 mg/dL (ref <117)

## 2013-11-11 LAB — POCT URINALYSIS DIPSTICK
Bilirubin, UA: NEGATIVE
Blood, UA: NEGATIVE
Glucose, UA: NEGATIVE
KETONES UA: NEGATIVE
Leukocytes, UA: NEGATIVE
Nitrite, UA: NEGATIVE
PH UA: 6
PROTEIN UA: NEGATIVE
SPEC GRAV UA: 1.01
Urobilinogen, UA: NEGATIVE

## 2013-11-11 LAB — T3: T3, Total: 78.7 ng/dL — ABNORMAL LOW (ref 80.0–204.0)

## 2013-11-11 LAB — URIC ACID: URIC ACID, SERUM: 6.4 mg/dL (ref 2.4–7.0)

## 2013-11-11 LAB — T4, FREE: Free T4: 0.63 ng/dL — ABNORMAL LOW (ref 0.80–1.80)

## 2013-11-11 LAB — TSH: TSH: 84.281 u[IU]/mL — AB (ref 0.350–4.500)

## 2013-11-11 MED ORDER — OMEPRAZOLE 40 MG PO CPDR: 40.0000 mg | DELAYED_RELEASE_CAPSULE | Freq: Every day | ORAL | Status: DC

## 2013-11-11 MED ORDER — ALBUTEROL SULFATE HFA 108 (90 BASE) MCG/ACT IN AERS: 1.0000 | INHALATION_SPRAY | Freq: Four times a day (QID) | RESPIRATORY_TRACT | Status: DC | PRN

## 2013-11-11 MED ORDER — ATORVASTATIN CALCIUM 20 MG PO TABS: 20.0000 mg | ORAL_TABLET | Freq: Every day | ORAL | Status: DC

## 2013-11-11 MED ORDER — MOMETASONE FURO-FORMOTEROL FUM 100-5 MCG/ACT IN AERO: 2.0000 | INHALATION_SPRAY | Freq: Two times a day (BID) | RESPIRATORY_TRACT | Status: DC

## 2013-11-11 MED ORDER — AZELASTINE-FLUTICASONE 137-50 MCG/ACT NA SUSP: 1.0000 | Freq: Two times a day (BID) | NASAL | Status: DC

## 2013-11-11 MED ORDER — TOPIRAMATE 50 MG PO TABS: 50.0000 mg | ORAL_TABLET | Freq: Two times a day (BID) | ORAL | Status: DC

## 2013-11-11 MED ORDER — FAMOTIDINE 20 MG PO TABS: 20.0000 mg | ORAL_TABLET | Freq: Two times a day (BID) | ORAL | Status: DC

## 2013-11-11 MED ORDER — ASPIRIN 81 MG PO TABS: 81.0000 mg | ORAL_TABLET | Freq: Every day | ORAL | Status: DC

## 2013-11-11 MED ORDER — FUROSEMIDE 20 MG PO TABS: 20.0000 mg | ORAL_TABLET | Freq: Every day | ORAL | Status: DC

## 2013-11-11 NOTE — Telephone Encounter (Signed)
pls refer to [facility] for Fibromyalgia study

## 2013-11-11 NOTE — Addendum Note (Signed)
Addended by: Carlena Hurl on: 11/11/2013 01:51 PM   Modules accepted: Orders
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
## 2013-11-12 LAB — SEDIMENTATION RATE: Sed Rate: 11 mm/hr (ref 0–22)

## 2013-11-15 ENCOUNTER — Telehealth: Payer: Self-pay | Admitting: Medical

## 2013-11-15 NOTE — Telephone Encounter (Signed)
Firs of all, please call [facility] and make sure they got the referral, see how quickly we can get her into study and how quickly she would be assigned to a drug arm?  Is there a placebo only arm, or would she be on some dose of the medication for sure?

## 2013-11-15 NOTE — Telephone Encounter (Signed)
Pt called and stated that Dr. Durward Fortes wants her to be on medication for fibromyalgia. Per pt he doesn't want her to wait that long to be on medication that the study could take months.  Pt states Dr. Durward Fortes didn't give her medication because he didn't want to interfere with study. Pt is asking for medication to be sent to [facility] [place]. Pt also would like her lab results.

## 2013-11-15 NOTE — Telephone Encounter (Signed)
Sent to [facility]

## 2013-11-16 ENCOUNTER — Other Ambulatory Visit: Payer: Self-pay | Admitting: Medical

## 2013-11-16 ENCOUNTER — Telehealth: Payer: Self-pay | Admitting: Family Medicine

## 2013-11-16 NOTE — Telephone Encounter (Signed)
I spoke with [facility] and Sharyn Lull contacted the patient yesterday and left a message and she would tell Sharyn Lull to contact the patient again today on her cell phone. She said that they get the patients in fairly fast. She said this is a blind study they are not sure what the patient will be on.

## 2013-11-16 NOTE — Telephone Encounter (Signed)
Sharyn Lull called back and said she doesn't qualify for the study because she has a allergy to tylenol and she has to be able to take tylenol if she has problems with any of the medications during the study. The patient wants you to please send her a refill on her Cymbalta.

## 2013-11-16 NOTE — Telephone Encounter (Signed)
After looking back over her chart, there are some unanswered questions, and some things that need to be addressed further  Labs ok except thyroid labs worse than prior despite being on max dose of Levothyroxine and Cytomel.   Her alkaline phophatase is mildly abnormal.  Recommendations: 1) I still don't have any chart records from eval/treatment for prior heart attack.  I NEED THESE RECORDS.  Please get them! 2) I don't have any chart records from prior eval with gastroenterology, Dr. Lyndel Safe in [place].  I NEED prior GI records including EGD, Colonoscopy, last 2 office notes .  Please get them! 3) if she is not taking Vitamin D, then I will restart prescription Vit D.  Let me know. 4) since the thyroid continues to be hard to control for the last 2 years on 2 medications, lets go ahead and refer to endocrinology.  (send numerous labs over the past year to endocrinology as well as routine records) 5) I would rather hold off on Cymbalta til after endocrinology sees her.  However, if adamant, then I will start her on Cymbalta.  Let me know.

## 2013-11-17 ENCOUNTER — Other Ambulatory Visit: Payer: Self-pay | Admitting: Medical

## 2013-11-17 MED ORDER — VITAMIN D (ERGOCALCIFEROL) 1.25 MG (50000 UNIT) PO CAPS: 50000.0000 [IU] | ORAL_CAPSULE | ORAL | Status: DC

## 2013-11-17 MED ORDER — DULOXETINE HCL 30 MG PO CPEP: 30.0000 mg | ORAL_CAPSULE | Freq: Every day | ORAL | Status: DC

## 2013-11-17 NOTE — Telephone Encounter (Signed)
LMOM TO CB. CLS 

## 2013-11-17 NOTE — Telephone Encounter (Signed)
Patient states that Dr. Durward Fortes said that her Fibromyalgia is very active and she needs to be on some type of medication either cymbalta or Lyrica something. She said can you please send something in for her because she can't deal with the pain.  Patient is aware of Dorothea Ogle PAc message in detail and she understood everything. She is aware of all of the records that she needs to get from prior doctors.  Patient is aware of the referral and she is okay with that. I fax patient copies of her labs. I fax over her endocrinology referral

## 2013-11-17 NOTE — Telephone Encounter (Signed)
Ok, lets begin trial of Cymbalta

## 2013-11-18 NOTE — Telephone Encounter (Signed)
I want to get prior GI records first, before making any other changes.   We already have referral in to endocrinology.

## 2013-11-18 NOTE — Telephone Encounter (Signed)
LM to CB..has she heard from [facility] if not then what? I refaxed form.

## 2013-11-18 NOTE — Telephone Encounter (Signed)
I advised patient that she could start Cymbalta that Audelia Acton called in, and to let us know how it goes. She has two questions, Audelia Acton, she wants to know if she needs GI referral? If so she has been to Dr. Lyndel Safe in [place] and she would like to return there [phone], or Dr. Lyda Jester [phone]. Vanessa Berg, patient wants you to call her and let her know which endocrinologist you are sending her to?

## 2013-11-18 NOTE — Telephone Encounter (Signed)
LM to CB

## 2013-11-19 NOTE — Telephone Encounter (Signed)
LMOM FOR THE PATIENT

## 2013-11-22 ENCOUNTER — Other Ambulatory Visit: Payer: Self-pay | Admitting: Family Medicine

## 2013-11-22 DIAGNOSIS — E039 Hypothyroidism, unspecified: Secondary | ICD-10-CM

## 2013-12-14 ENCOUNTER — Telehealth: Payer: Self-pay | Admitting: Family Medicine

## 2013-12-14 ENCOUNTER — Encounter: Payer: Self-pay | Admitting: Endocrinology

## 2013-12-14 ENCOUNTER — Ambulatory Visit (INDEPENDENT_AMBULATORY_CARE_PROVIDER_SITE_OTHER): Payer: PRIVATE HEALTH INSURANCE | Admitting: Endocrinology

## 2013-12-14 VITALS — BP 130/88 | HR 73 | Temp 97.9°F | Ht 61.5 in | Wt 238.0 lb

## 2013-12-14 DIAGNOSIS — R197 Diarrhea, unspecified: Secondary | ICD-10-CM | POA: Insufficient documentation

## 2013-12-14 MED ORDER — LEVOTHYROXINE SODIUM 125 MCG PO TABS: ORAL_TABLET | ORAL | Status: DC

## 2013-12-14 NOTE — Telephone Encounter (Signed)
Is she taking Cymbalta in the morning or at bedtime?  How quickly after taking this does she feel nauseated?

## 2013-12-14 NOTE — Telephone Encounter (Signed)
Patient states that she takes in the morning after she eats that's what her bottle said and the she feels nausea 30 minutes later. She said the medication is not really helping anyway. It's not touching the pain. Her endocrinology doctor has put her on 250 mcg of thyroid medications

## 2013-12-14 NOTE — Telephone Encounter (Signed)
Patient called and said that the Cymbalta is still making her feel Nausea and sick on her stomach. She said that she was taking the medication to help with the nausea but its not helping at all. Please, advise what she needs to do next.

## 2013-12-14 NOTE — Telephone Encounter (Signed)
Patient did agree to try Lyrica. What dose do you want her to try? Does she need to completely stop her Cymbalta?
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
## 2013-12-14 NOTE — Telephone Encounter (Signed)
If she would like, we can try a period of Lyrica which is a different medication for Fibromyalgia   If agreeable I'll see what samples we have.

## 2013-12-14 NOTE — Patient Instructions (Addendum)
Please increase the levothyroxine to 2x125 mcg/day.  i have sent a prescription to your pharmacy Please see a gastroenterology specialist.  you will receive a phone call, about a day and time for an appointment. Please come back for a follow-up appointment in 1 month. Please stop taking the cytomel pills.            Levothyroxine tablets What is this medicine? LEVOTHYROXINE (lee voe thye ROX een) is a thyroid hormone. This medicine can improve symptoms of thyroid deficiency such as slow speech, lack of energy, weight gain, hair loss, dry skin, and feeling cold. It also helps to treat goiter (an enlarged thyroid gland). It is also used to treat some kinds of thyroid cancer along with surgery and other medicines. This medicine may be used for other purposes; ask your health care provider or pharmacist if you have questions. COMMON BRAND NAME(S): Estre, Levo-T, Levothroid, Levoxyl, Synthroid, Thyro-Tabs, Unithroid What should I tell my health care provider before I take this medicine? They need to know if you have any of these conditions: -angina -blood clotting problems -diabetes -dieting or on a weight loss program -fertility problems -heart disease -high levels of thyroid hormone -pituitary gland problem -previous heart attack -an unusual or allergic reaction to levothyroxine, thyroid hormones, other medicines, foods, dyes, or preservatives -pregnant or trying to get pregnant -breast-feeding How should I use this medicine? Take this medicine by mouth with plenty of water. It is best to take on an empty stomach, at least 30 minutes before or 2 hours after food. Follow the directions on the prescription label. Take at the same time each day. Do not take your medicine more often than directed. Contact your pediatrician regarding the use of this medicine in children. While this drug may be prescribed for children and infants as young as a few days of age for selected conditions,  precautions do apply. For infants, you may crush the tablet and place in a small amount of (5-10 ml or 1 to 2 teaspoonfuls) of water, breast milk, or non-soy based infant formula. Do not mix with soy-based infant formula. Give as directed. Overdosage: If you think you have taken too much of this medicine contact a poison control center or emergency room at once. NOTE: This medicine is only for you. Do not share this medicine with others. What if I miss a dose? If you miss a dose, take it as soon as you can. If it is almost time for your next dose, take only that dose. Do not take double or extra doses. What may interact with this medicine? -amiodarone -antacids -anti-thyroid medicines -calcium supplements -carbamazepine -cholestyramine -colestipol -digoxin -female hormones, including contraceptive or birth control pills -iron supplements -ketamine -liquid nutrition products like Ensure -medicines for colds and breathing difficulties -medicines for diabetes -medicines for mental depression -medicines or herbals used to decrease weight or appetite -phenobarbital or other barbiturate medications -phenytoin -prednisone or other corticosteroids -rifabutin -rifampin -soy isoflavones -sucralfate -theophylline -warfarin This list may not describe all possible interactions. Give your health care provider a list of all the medicines, herbs, non-prescription drugs, or dietary supplements you use. Also tell them if you smoke, drink alcohol, or use illegal drugs. Some items may interact with your medicine. What should I watch for while using this medicine? Be sure to take this medicine with plenty of fluids. Some tablets may cause choking, gagging, or difficulty swallowing from the tablet getting stuck in your throat. Most of these problems disappear if the medicine  is taken with the right amount of water or other fluids. Do not switch brands of this medicine unless your health care professional  agrees with the change. Ask questions if you are uncertain. You will need regular exams and occasional blood tests to check the response to treatment. If you are receiving this medicine for an underactive thyroid, it may be several weeks before you notice an improvement. Check with your doctor or health care professional if your symptoms do not improve. It may be necessary for you to take this medicine for the rest of your life. Do not stop using this medicine unless your doctor or health care professional advises you to. This medicine can affect blood sugar levels. If you have diabetes, check your blood sugar as directed. You may lose some of your hair when you first start treatment. With time, this usually corrects itself. If you are going to have surgery, tell your doctor or health care professional that you are taking this medicine. What side effects may I notice from receiving this medicine? Side effects that you should report to your doctor or health care professional as soon as possible: -allergic reactions like skin rash, itching or hives, swelling of the face, lips, or tongue -chest pain -excessive sweating or intolerance to heat -fast or irregular heartbeat -nervousness -skin rash or hives -swelling of ankles, feet, or legs -tremors Side effects that usually do not require medical attention (report to your doctor or health care professional if they continue or are bothersome): -changes in appetite -changes in menstrual periods -diarrhea -hair loss -headache -trouble sleeping -weight loss This list may not describe all possible side effects. Call your doctor for medical advice about side effects. You may report side effects to FDA at 1-800-FDA-1088. Where should I keep my medicine? Keep out of the reach of children. Store at room temperature between 15 and 30 degrees C (59 and 86 degrees F). Protect from light and moisture. Keep container tightly closed. Throw away any unused  medicine after the expiration date. NOTE: This sheet is a summary. It may not cover all possible information. If you have questions about this medicine, talk to your doctor, pharmacist, or health care provider.  2015, Elsevier/Gold Standard. (2008-04-22 14:28:07)

## 2013-12-15 ENCOUNTER — Other Ambulatory Visit: Payer: Self-pay | Admitting: Family Medicine

## 2013-12-15 ENCOUNTER — Other Ambulatory Visit: Payer: Self-pay | Admitting: Medical

## 2013-12-15 MED ORDER — PREGABALIN 75 MG PO CAPS: 75.0000 mg | ORAL_CAPSULE | Freq: Two times a day (BID) | ORAL | Status: DC

## 2013-12-15 NOTE — Telephone Encounter (Signed)
LMOM TO CB. CLS 

## 2013-12-15 NOTE — Progress Notes (Signed)
I sent Rx to her pharmacy for Lyrica

## 2013-12-15 NOTE — Telephone Encounter (Signed)
I called out Lyric 75 mg to the pharmacy per Chana Bode [employer]

## 2013-12-15 NOTE — Telephone Encounter (Signed)
Patient is aware of Vanessa Berg [facility] message and recommendations. She is coming by the office to pick up her samples, coupon cards and instructions for the medication.

## 2013-12-15 NOTE — Telephone Encounter (Signed)
Please give her samples and let us start with this. I gave 50 and 75 mg capsule samples. Start with 50 mg capsule once at night for the first 3 days, then go to twice a day. When she runs out of the 50 mg capsules then switch to 75 mg capsule twice daily. Call back before running out to see how she is tolerating this.  Some people tolerate Lyrica well some do not, side effects can include dizziness, drowsiness, dry mouth, blurred vision, so let us know if any problems with the medication.

## 2014-01-10 ENCOUNTER — Encounter: Payer: Self-pay | Admitting: Medical

## 2014-01-13 ENCOUNTER — Telehealth: Payer: Self-pay | Admitting: Medical

## 2014-01-13 ENCOUNTER — Ambulatory Visit: Payer: PRIVATE HEALTH INSURANCE | Admitting: Endocrinology

## 2014-01-13 NOTE — Telephone Encounter (Signed)
Patient states that she will discuss this with you at her OV on 01/25/14

## 2014-01-13 NOTE — Telephone Encounter (Signed)
I received a [facility] abnormal overnight oxymetry report.  If she is not already using CPAP or hasn't had sleep study, this would qualify for a sleep study.   See if she wants to proceed with sleep study.

## 2014-01-25 ENCOUNTER — Telehealth: Payer: Self-pay | Admitting: Medical

## 2014-01-25 ENCOUNTER — Encounter: Payer: Self-pay | Admitting: Medical

## 2014-01-25 ENCOUNTER — Ambulatory Visit (INDEPENDENT_AMBULATORY_CARE_PROVIDER_SITE_OTHER): Payer: PRIVATE HEALTH INSURANCE | Admitting: Medical

## 2014-01-25 VITALS — BP 110/70 | HR 72 | Temp 97.6°F | Resp 16 | Wt 235.0 lb

## 2014-01-25 DIAGNOSIS — Z1211 Encounter for screening for malignant neoplasm of colon: Secondary | ICD-10-CM

## 2014-01-25 DIAGNOSIS — M797 Fibromyalgia: Secondary | ICD-10-CM

## 2014-01-25 DIAGNOSIS — B820 Intestinal helminthiasis, unspecified: Secondary | ICD-10-CM

## 2014-01-25 DIAGNOSIS — G479 Sleep disorder, unspecified: Secondary | ICD-10-CM

## 2014-01-25 DIAGNOSIS — R197 Diarrhea, unspecified: Secondary | ICD-10-CM

## 2014-01-25 MED ORDER — PREGABALIN 100 MG PO CAPS: 100.0000 mg | ORAL_CAPSULE | Freq: Two times a day (BID) | ORAL | Status: DC

## 2014-01-25 MED ORDER — IVERMECTIN 3 MG PO TABS: 200.0000 ug/kg | ORAL_TABLET | Freq: Once | ORAL | Status: DC

## 2014-01-25 NOTE — Telephone Encounter (Signed)
Refer to GI 

## 2014-01-25 NOTE — Progress Notes (Signed)
Subjective: Here for f/u on fibromyalgia.  lyrica 75mg  BID helping some but still having lots of pain.   Pains in muscles all over.  Didn't tolerate cymbalta.  Not exercising but getting ready to join [employer].    Still having diarrhea.  Wants referral to GI as she is due for updated colonoscopy.   She notes over christmas while out of town, saw a white round several inch long worm in her stool.  Still getting some belly discomfort and ongoing loose stools.  No other c/o.  Objective: Gen: wd, wn, nad Abdomen: mild generalized tenderness, no mass, no organomegaly Lungs clear Ext: no edema   Assessment: Encounter Diagnoses  Name Primary?  . Fibromyalgia Yes  . Intestinal worms   . Diarrhea   . Sleep disturbance   . Special screening for malignant neoplasms, colon    Plan: Fibromyalgia - increase to 100mg  BID Lyrica.  advised the need for routine exercise.  Didn't tolerate cymbalta.    Possible worm infection, diarrhea - trial of Ivermectin.   Referral to GI for recheck on this and referral for updated colonoscopy  Sleep disturbance - changes above as with fibromyalgia  F/u with recheck/call back within a month.

## 2014-01-26 NOTE — Telephone Encounter (Signed)
i have sent referral over to  GI, they should call her to set up appt

## 2014-02-01 ENCOUNTER — Encounter: Payer: Self-pay | Admitting: Endocrinology

## 2014-02-01 ENCOUNTER — Ambulatory Visit (INDEPENDENT_AMBULATORY_CARE_PROVIDER_SITE_OTHER): Payer: PRIVATE HEALTH INSURANCE | Admitting: Endocrinology

## 2014-02-01 VITALS — BP 130/82 | HR 78 | Temp 98.3°F | Ht 61.5 in | Wt 239.0 lb

## 2014-02-01 DIAGNOSIS — E038 Other specified hypothyroidism: Secondary | ICD-10-CM

## 2014-02-01 LAB — TSH: TSH: 0.81 u[IU]/mL (ref 0.35–4.50)

## 2014-02-01 LAB — T4, FREE: Free T4: 1.25 ng/dL (ref 0.60–1.60)

## 2014-02-01 NOTE — Patient Instructions (Addendum)
blood tests are being requested for you today.  We'll let you know about the results.
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
## 2014-02-10 ENCOUNTER — Ambulatory Visit: Payer: PRIVATE HEALTH INSURANCE | Admitting: Medical

## 2014-03-02 ENCOUNTER — Encounter: Payer: Self-pay | Admitting: Medical

## 2014-03-02 ENCOUNTER — Ambulatory Visit (INDEPENDENT_AMBULATORY_CARE_PROVIDER_SITE_OTHER): Payer: PRIVATE HEALTH INSURANCE | Admitting: Medical

## 2014-03-02 ENCOUNTER — Ambulatory Visit: Payer: PRIVATE HEALTH INSURANCE | Admitting: Medical

## 2014-03-02 VITALS — BP 110/80 | HR 72 | Temp 97.7°F | Resp 15 | Wt 223.0 lb

## 2014-03-02 DIAGNOSIS — R3915 Urgency of urination: Secondary | ICD-10-CM

## 2014-03-02 DIAGNOSIS — J4520 Mild intermittent asthma, uncomplicated: Secondary | ICD-10-CM

## 2014-03-02 DIAGNOSIS — R300 Dysuria: Secondary | ICD-10-CM

## 2014-03-02 DIAGNOSIS — R195 Other fecal abnormalities: Secondary | ICD-10-CM

## 2014-03-02 DIAGNOSIS — Z23 Encounter for immunization: Secondary | ICD-10-CM

## 2014-03-02 DIAGNOSIS — Z8709 Personal history of other diseases of the respiratory system: Secondary | ICD-10-CM

## 2014-03-02 DIAGNOSIS — J189 Pneumonia, unspecified organism: Secondary | ICD-10-CM

## 2014-03-02 DIAGNOSIS — R8290 Unspecified abnormal findings in urine: Secondary | ICD-10-CM

## 2014-03-02 LAB — POCT URINALYSIS DIPSTICK
GLUCOSE UA: NEGATIVE
LEUKOCYTES UA: NEGATIVE
NITRITE UA: NEGATIVE
Spec Grav, UA: >=1.03
Urobilinogen, UA: 1
pH, UA: 5.5

## 2014-03-02 MED ORDER — MOXIFLOXACIN HCL 400 MG PO TABS: 400.0000 mg | ORAL_TABLET | Freq: Every day | ORAL | Status: DC

## 2014-03-02 MED ORDER — FAMOTIDINE 20 MG PO TABS: 20.0000 mg | ORAL_TABLET | Freq: Two times a day (BID) | ORAL | Status: DC

## 2014-03-02 MED ORDER — FUROSEMIDE 20 MG PO TABS: 20.0000 mg | ORAL_TABLET | Freq: Every day | ORAL | Status: DC

## 2014-03-02 MED ORDER — ALBUTEROL SULFATE HFA 108 (90 BASE) MCG/ACT IN AERS: 1.0000 | INHALATION_SPRAY | Freq: Four times a day (QID) | RESPIRATORY_TRACT | Status: DC | PRN

## 2014-03-02 MED ORDER — MOMETASONE FURO-FORMOTEROL FUM 100-5 MCG/ACT IN AERO: 2.0000 | INHALATION_SPRAY | Freq: Two times a day (BID) | RESPIRATORY_TRACT | Status: DC

## 2014-03-02 MED ORDER — ASPIRIN 81 MG PO TABS: 81.0000 mg | ORAL_TABLET | Freq: Every day | ORAL | Status: DC

## 2014-03-02 MED ORDER — AZELASTINE-FLUTICASONE 137-50 MCG/ACT NA SUSP: 1.0000 | Freq: Two times a day (BID) | NASAL | Status: DC

## 2014-03-02 MED ORDER — VITAMIN D (ERGOCALCIFEROL) 1.25 MG (50000 UNIT) PO CAPS: 50000.0000 [IU] | ORAL_CAPSULE | ORAL | Status: DC

## 2014-03-02 MED ORDER — ATORVASTATIN CALCIUM 20 MG PO TABS: 20.0000 mg | ORAL_TABLET | Freq: Every day | ORAL | Status: DC

## 2014-03-02 MED ORDER — TOPIRAMATE 50 MG PO TABS: 50.0000 mg | ORAL_TABLET | Freq: Two times a day (BID) | ORAL | Status: DC

## 2014-03-02 MED ORDER — OMEPRAZOLE 40 MG PO CPDR: 40.0000 mg | DELAYED_RELEASE_CAPSULE | Freq: Every day | ORAL | Status: DC

## 2014-03-02 NOTE — Addendum Note (Signed)
Addended by: Armanda Magic on: 03/02/2014 10:49 AM   Modules accepted: Orders
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
## 2014-03-04 ENCOUNTER — Ambulatory Visit: Payer: PRIVATE HEALTH INSURANCE | Admitting: Endocrinology

## 2014-03-04 LAB — URINE CULTURE: Colony Count: 3000

## 2014-03-08 ENCOUNTER — Telehealth: Payer: Self-pay | Admitting: Family Medicine

## 2014-03-08 ENCOUNTER — Telehealth: Payer: Self-pay | Admitting: Medical

## 2014-03-08 NOTE — Telephone Encounter (Signed)
Patient was concerned about the spot on her arm after giving her a Prevnar 13 injection. I explain to her last week that we have people to have site injection reaction from this vaccine. We advised her to use hot or cold compresses and she could also use Benadryl to help. Patient states that the redness was gone but she still had the knot. I told to continue to use hot or cold compresses to the arm and call back on Thursday if not any better or stop by the office so Dorothea Ogle could look at her arm.

## 2014-03-08 NOTE — Telephone Encounter (Signed)
lm

## 2014-03-09 ENCOUNTER — Encounter: Payer: Self-pay | Admitting: Medical

## 2014-03-18 ENCOUNTER — Other Ambulatory Visit: Payer: Self-pay | Admitting: Gastroenterology

## 2014-03-29 ENCOUNTER — Encounter (HOSPITAL_COMMUNITY): Payer: Self-pay | Admitting: *Deleted

## 2014-03-29 DIAGNOSIS — K5790 Diverticulosis of intestine, part unspecified, without perforation or abscess without bleeding: Secondary | ICD-10-CM

## 2014-03-29 DIAGNOSIS — K2270 Barrett's esophagus without dysplasia: Secondary | ICD-10-CM | POA: Diagnosis present

## 2014-03-29 HISTORY — DX: Barrett's esophagus without dysplasia: K22.70

## 2014-03-29 HISTORY — DX: Diverticulosis of intestine, part unspecified, without perforation or abscess without bleeding: K57.90

## 2014-04-01 ENCOUNTER — Ambulatory Visit (HOSPITAL_COMMUNITY)
Admission: RE | Admit: 2014-04-01 | Discharge: 2014-04-01 | Disposition: A | Discharge status: Home / Self Care | Payer: PRIVATE HEALTH INSURANCE | Source: Ambulatory Visit | Attending: Gastroenterology | Admitting: Gastroenterology

## 2014-04-01 ENCOUNTER — Ambulatory Visit (HOSPITAL_COMMUNITY): Payer: PRIVATE HEALTH INSURANCE | Admitting: Anesthesiology

## 2014-04-01 ENCOUNTER — Encounter (HOSPITAL_COMMUNITY): Payer: Self-pay | Admitting: *Deleted

## 2014-04-01 ENCOUNTER — Encounter (HOSPITAL_COMMUNITY)
Admission: RE | Disposition: A | Discharge status: Home / Self Care | Payer: Self-pay | Source: Ambulatory Visit | Attending: Gastroenterology

## 2014-04-01 DIAGNOSIS — M797 Fibromyalgia: Secondary | ICD-10-CM | POA: Insufficient documentation

## 2014-04-01 DIAGNOSIS — R197 Diarrhea, unspecified: Secondary | ICD-10-CM | POA: Diagnosis present

## 2014-04-01 DIAGNOSIS — K449 Diaphragmatic hernia without obstruction or gangrene: Secondary | ICD-10-CM | POA: Insufficient documentation

## 2014-04-01 DIAGNOSIS — E039 Hypothyroidism, unspecified: Secondary | ICD-10-CM | POA: Insufficient documentation

## 2014-04-01 DIAGNOSIS — K219 Gastro-esophageal reflux disease without esophagitis: Secondary | ICD-10-CM | POA: Insufficient documentation

## 2014-04-01 DIAGNOSIS — R51 Headache: Secondary | ICD-10-CM | POA: Diagnosis not present

## 2014-04-01 DIAGNOSIS — M13869 Other specified arthritis, unspecified knee: Secondary | ICD-10-CM | POA: Insufficient documentation

## 2014-04-01 DIAGNOSIS — E669 Obesity, unspecified: Secondary | ICD-10-CM | POA: Diagnosis not present

## 2014-04-01 DIAGNOSIS — K2270 Barrett's esophagus without dysplasia: Secondary | ICD-10-CM | POA: Diagnosis not present

## 2014-04-01 DIAGNOSIS — Z87891 Personal history of nicotine dependence: Secondary | ICD-10-CM | POA: Diagnosis not present

## 2014-04-01 DIAGNOSIS — K5730 Diverticulosis of large intestine without perforation or abscess without bleeding: Secondary | ICD-10-CM | POA: Insufficient documentation

## 2014-04-01 DIAGNOSIS — G8929 Other chronic pain: Secondary | ICD-10-CM | POA: Insufficient documentation

## 2014-04-01 DIAGNOSIS — J4540 Moderate persistent asthma, uncomplicated: Secondary | ICD-10-CM | POA: Insufficient documentation

## 2014-04-01 DIAGNOSIS — I252 Old myocardial infarction: Secondary | ICD-10-CM | POA: Insufficient documentation

## 2014-04-01 HISTORY — PX: COLONOSCOPY WITH PROPOFOL: SHX5780

## 2014-04-01 HISTORY — PX: ESOPHAGOGASTRODUODENOSCOPY (EGD) WITH PROPOFOL: SHX5813

## 2014-04-01 HISTORY — DX: Acute myocardial infarction, unspecified: I21.9

## 2014-04-01 HISTORY — DX: Other specified postprocedural states: Z98.890

## 2014-04-01 HISTORY — DX: Nausea with vomiting, unspecified: R11.2

## 2014-04-01 SURGERY — ESOPHAGOGASTRODUODENOSCOPY (EGD) WITH PROPOFOL
Anesthesia: General

## 2014-04-01 MED ORDER — SCOPOLAMINE 1 MG/3DAYS TD PT72
MEDICATED_PATCH | TRANSDERMAL | Status: AC
  Filled 2014-04-01: qty 1

## 2014-04-01 MED ORDER — LACTATED RINGERS IV SOLN
INTRAVENOUS | Status: DC | PRN
  Administered 2014-04-01 (×2): via INTRAVENOUS

## 2014-04-01 MED ORDER — PROPOFOL INFUSION 10 MG/ML OPTIME
INTRAVENOUS | Status: DC | PRN
  Administered 2014-04-01: 200 ug/kg/min via INTRAVENOUS

## 2014-04-01 MED ORDER — GLYCOPYRROLATE 0.2 MG/ML IJ SOLN
INTRAMUSCULAR | Status: DC | PRN
  Administered 2014-04-01: 0.4 mg via INTRAVENOUS

## 2014-04-01 MED ORDER — DEXAMETHASONE SODIUM PHOSPHATE 10 MG/ML IJ SOLN
INTRAMUSCULAR | Status: AC
  Filled 2014-04-01: qty 1

## 2014-04-01 MED ORDER — MIDAZOLAM HCL 5 MG/5ML IJ SOLN
INTRAMUSCULAR | Status: DC | PRN
  Administered 2014-04-01: 2 mg via INTRAVENOUS

## 2014-04-01 MED ORDER — METOCLOPRAMIDE HCL 5 MG/ML IJ SOLN
INTRAMUSCULAR | Status: DC | PRN
  Administered 2014-04-01: 10 mg via INTRAVENOUS

## 2014-04-01 MED ORDER — PROPOFOL 10 MG/ML IV BOLUS
INTRAVENOUS | Status: AC
  Filled 2014-04-01: qty 20

## 2014-04-01 MED ORDER — PROPOFOL 10 MG/ML IV BOLUS
INTRAVENOUS | Status: DC | PRN
  Administered 2014-04-01: 200 mg via INTRAVENOUS

## 2014-04-01 MED ORDER — SUCCINYLCHOLINE CHLORIDE 20 MG/ML IJ SOLN
INTRAMUSCULAR | Status: DC | PRN
  Administered 2014-04-01: 100 mg via INTRAVENOUS

## 2014-04-01 MED ORDER — ONDANSETRON HCL 4 MG/2ML IJ SOLN
INTRAMUSCULAR | Status: DC | PRN
  Administered 2014-04-01: 4 mg via INTRAVENOUS

## 2014-04-01 MED ORDER — GLYCOPYRROLATE 0.2 MG/ML IJ SOLN
INTRAMUSCULAR | Status: AC
  Filled 2014-04-01: qty 1

## 2014-04-01 MED ORDER — MIDAZOLAM HCL 2 MG/2ML IJ SOLN
INTRAMUSCULAR | Status: AC
  Filled 2014-04-01: qty 2

## 2014-04-01 MED ORDER — SODIUM CHLORIDE 0.9 % IV SOLN: INTRAVENOUS | Status: DC

## 2014-04-01 MED ORDER — LIDOCAINE HCL 1 % IJ SOLN
INTRAMUSCULAR | Status: DC | PRN
  Administered 2014-04-01: 100 mg via INTRADERMAL

## 2014-04-01 MED ORDER — SCOPOLAMINE 1 MG/3DAYS TD PT72
MEDICATED_PATCH | TRANSDERMAL | Status: DC | PRN
  Administered 2014-04-01: 1 via TRANSDERMAL

## 2014-04-01 MED ORDER — KETAMINE HCL 10 MG/ML IJ SOLN
INTRAMUSCULAR | Status: DC | PRN
  Administered 2014-04-01: 220 mg via INTRAVENOUS

## 2014-04-01 MED ORDER — KETAMINE HCL 10 MG/ML IJ SOLN
INTRAMUSCULAR | Status: AC
  Filled 2014-04-01: qty 1

## 2014-04-01 MED ORDER — ONDANSETRON HCL 4 MG/2ML IJ SOLN
INTRAMUSCULAR | Status: AC
  Filled 2014-04-01: qty 2

## 2014-04-01 MED ORDER — LACTATED RINGERS IV SOLN
INTRAVENOUS | Status: DC
  Administered 2014-04-01: 1000 mL via INTRAVENOUS

## 2014-04-01 MED ORDER — ESMOLOL HCL 10 MG/ML IV SOLN
INTRAVENOUS | Status: DC | PRN
  Administered 2014-04-01: 20 mg via INTRAVENOUS

## 2014-04-01 MED ORDER — LIDOCAINE HCL (CARDIAC) 20 MG/ML IV SOLN
INTRAVENOUS | Status: AC
  Filled 2014-04-01: qty 5

## 2014-04-01 MED ORDER — DEXAMETHASONE SODIUM PHOSPHATE 10 MG/ML IJ SOLN
INTRAMUSCULAR | Status: DC | PRN
  Administered 2014-04-01: 10 mg via INTRAVENOUS

## 2014-04-01 SURGICAL SUPPLY — 24 items

## 2014-04-01 NOTE — Discharge Instructions (Signed)
Esophagogastroduodenoscopy °Care After °Refer to this sheet in the next few weeks. These instructions provide you with information on caring for yourself after your procedure. Your caregiver may also give you more specific instructions. Your treatment has been planned according to current medical practices, but problems sometimes occur. Call your caregiver if you have any problems or questions after your procedure.  °HOME CARE INSTRUCTIONS °· Do not eat or drink anything until the numbing medicine (local anesthetic) has worn off and your gag reflex has returned. You will know that the local anesthetic has worn off when you can swallow comfortably. °· Do not drive for 12 hours after the procedure or as directed by your caregiver. °· Only take medicines as directed by your caregiver. °SEEK MEDICAL CARE IF:  °· You cannot stop coughing. °· You are not urinating at all or less than usual. °SEEK IMMEDIATE MEDICAL CARE IF: °· You have difficulty swallowing. °· You cannot eat or drink. °· You have worsening throat or chest pain. °· You have dizziness, lightheadedness, or you faint. °· You have nausea or vomiting. °· You have chills. °· You have a fever. °· You have severe abdominal pain. °· You have black, tarry, or bloody stools. °Document Released: 01/01/2012 Document Reviewed: 01/01/2012 °ExitCare® Patient Information ©2015 [employer]. This information is not intended to replace advice given to you by your health care provider. Make sure you discuss any questions you have with your health care provider. ° °Colonoscopy, Care After °Refer to this sheet in the next few weeks. These instructions provide you with information on caring for yourself after your procedure. Your health care provider may also give you more specific instructions. Your treatment has been planned according to current medical practices, but problems sometimes occur. Call your health care provider if you have any problems or questions after your  procedure. °WHAT TO EXPECT AFTER THE PROCEDURE  °After your procedure, it is typical to have the following: °· A small amount of blood in your stool. °· Moderate amounts of gas and mild abdominal cramping or bloating. °HOME CARE INSTRUCTIONS °· Do not drive, operate machinery, or sign important documents for 24 hours. °· You may shower and resume your regular physical activities, but move at a slower pace for the first 24 hours. °· Take frequent rest periods for the first 24 hours. °· Walk around or put a warm pack on your abdomen to help reduce abdominal cramping and bloating. °· Drink enough fluids to keep your urine clear or pale yellow. °· You may resume your normal diet as instructed by your health care provider. Avoid heavy or fried foods that are hard to digest. °· Avoid drinking alcohol for 24 hours or as instructed by your health care provider. °· Only take over-the-counter or prescription medicines as directed by your health care provider. °· If a tissue sample (biopsy) was taken during your procedure: °¨ Do not take aspirin or blood thinners for 7 days, or as instructed by your health care provider. °¨ Do not drink alcohol for 7 days, or as instructed by your health care provider. °¨ Eat soft foods for the first 24 hours. °SEEK MEDICAL CARE IF: °You have persistent spotting of blood in your stool 2-3 days after the procedure. °SEEK IMMEDIATE MEDICAL CARE IF: °· You have more than a small spotting of blood in your stool. °· You pass large blood clots in your stool. °· Your abdomen is swollen (distended). °· You have nausea or vomiting. °· You have a   fever. °· You have increasing abdominal pain that is not relieved with medicine. °Document Released: 08/29/2003 Document Revised: 11/04/2012 Document Reviewed: 09/21/2012 °ExitCare® Patient Information ©2015 [employer]. This information is not intended to replace advice given to you by your health care provider. Make sure you discuss any questions you have  with your health care provider. ° ° ° °

## 2014-04-01 NOTE — Transfer of Care (Signed)
Immediate Anesthesia Transfer of Care Note  Patient: Vanessa Berg  Procedure(s) Performed: Procedure(s): ESOPHAGOGASTRODUODENOSCOPY (EGD) WITH PROPOFOL (N/A) COLONOSCOPY WITH PROPOFOL (N/A)  Patient Location: PACU and Endoscopy Unit  Anesthesia Type:General  Level of Consciousness: awake, oriented and patient cooperative  Airway & Oxygen Therapy: Patient Spontanous Breathing and Patient connected to nasal cannula oxygen  Post-op Assessment: Report given to RN and Post -op Vital signs reviewed and stable  Post vital signs: Reviewed and stable  Last Vitals:  Filed Vitals:   04/01/14 1232  BP: 120/66  Pulse:   Temp:   Resp:     Complications: No apparent anesthesia complications

## 2014-04-01 NOTE — Op Note (Signed)
[place], [address]   COLONOSCOPY PROCEDURE REPORT  PATIENT: Vanessa Berg, Vanessa Berg  MR#: [id number] BIRTHDATE: 07-Jul-1960 , 39  yrs. old GENDER: female ENDOSCOPIST: Carol Ada, MD REFERRED BY: PROCEDURE DATE:  2014/04/20 PROCEDURE:   Colonoscopy with biopsy ASA CLASS:   Class III INDICATIONS:unexplained diarrhea. MEDICATIONS: Monitored anesthesia care  DESCRIPTION OF PROCEDURE:   After the risks and benefits and of the procedure were explained, informed consent was obtained.  revealed no abnormalities of the rectum.    The Pentax Ped Colon [id number] endoscope was introduced through the anus and advanced to the terminal ileum which was intubated for a short distance .  The quality of the prep was poor. .  The instrument was then slowly withdrawn as the colon was fully examined.     FINDINGS: The colon was poorly prepped, however, with extensive wasing the film of stool was able to be removed.  The prep was then upgraded to good.  A diverticulum was noted int he transverse colon.  No evidence of any inflammation, ulcerations, erosions, polyps, masses, or vascular abnormalities.  Multiple cold biopsies were obtained throughout the colon..     Retroflexed views revealed no abnormalities.     The scope was then withdrawn from the patient and the procedure completed.  WITHDRAWAL TIME: 21 minutes 0 seconds  COMPLICATIONS: There were no immediate complications. ENDOSCOPIC IMPRESSION: 1) Diverticulum.  RECOMMENDATIONS: 1) Await biospy results. 2) Follow up in the office in one month.  REPEAT EXAM:  cc:  _______________________________ eSignedCarol Ada, MD Apr 20, 2014 12:43 PM   CPT CODES: ICD CODES:  The ICD and CPT codes recommended by this software are interpretations from the data that the clinical staff has captured with the software.  The verification of the translation of this report to the ICD and CPT codes  and modifiers is the sole responsibility of the health care institution and practicing physician where this report was generated.  [employer]. will not be held responsible for the validity of the ICD and CPT codes included on this report.  AMA assumes no liability for data contained or not contained herein. CPT is a Designer, television/film set of the [employer].

## 2014-04-01 NOTE — Op Note (Signed)
[place], [address]   ENDOSCOPY PROCEDURE REPORT  PATIENT: Vanessa Berg, Vanessa Berg  MR#: [id number] BIRTHDATE: February 23, 1960 , 71  yrs. old GENDER: female ENDOSCOPIST:Gustavo Meditz Benson Norway, MD REFERRED BY: PROCEDURE DATE:  04/29/14 PROCEDURE:   EGD w/ biopsy ASA CLASS:    Class III INDICATIONS: diarrhea. MEDICATION: Monitored anesthesia care TOPICAL ANESTHETIC:   none  DESCRIPTION OF PROCEDURE:   After the risks and benefits of the procedure were explained, informed consent was obtained.  The endoscope [id number]  endoscope was introduced through the mouth  and advanced to the second portion of the duodenum .  The instrument was slowly withdrawn as the mucosa was fully examined.    FINDINGS: In the distal esophagus there was evidence of a C1M1 Barrett's esophagus and multiple biopsies were obtained.  No evidence of any masses or mucosal nodularity in this area. Also, there was no evidence of any active inflammation.  The esophagus did not demonstrated any findings that would be suspicous for EoE. A 2 cm sliding hiatal hernia was found.  The gastric lumen was normal as well as the proximal small bowel.  Multiple cold biopsies were obtained.    Retroflexed views revealed no abnormalities. The scope was then withdrawn from the patient and the procedure completed.  COMPLICATIONS: There were no immediate complications.  ENDOSCOPIC IMPRESSION: 1) Await biopsy results. 2) Continue with current  RECOMMENDATIONS: 1) Await biopsy results. 2) Continue with current medications.  ? Trial of sucralfate.   _______________________________ eSignedCarol Ada, MD 04/29/14 12:49 PM     cc:  CPT CODES: ICD CODES:  The ICD and CPT codes recommended by this software are interpretations from the data that the clinical staff has captured with the software.  The verification of the translation of this report to the ICD and CPT codes and  modifiers is the sole responsibility of the health care institution and practicing physician where this report was generated.  [facility] [place] [facility]. will not be held responsible for the validity of the ICD and CPT codes included on this report.  AMA assumes no liability for data contained or not contained herein. CPT is a Designer, television/film set of the [employer].

## 2014-04-01 NOTE — Anesthesia Preprocedure Evaluation (Signed)
Anesthesia Evaluation  Patient identified by MRN, date of birth, ID band Patient awake    Reviewed: Allergy & Precautions, NPO status , Patient's Chart, lab work & pertinent test results  History of Anesthesia Complications (+) PONV  Airway Mallampati: II  TM Distance: >3 FB Neck ROM: Full    Dental no notable dental hx.    Pulmonary asthma , former smoker,  breath sounds clear to auscultation  Pulmonary exam normal       Cardiovascular + Past MI negative cardio ROS  Rhythm:Regular Rate:Normal     Neuro/Psych negative neurological ROS  negative psych ROS   GI/Hepatic Neg liver ROS, hiatal hernia, GERD-  ,  Endo/Other  negative endocrine ROS  Renal/GU negative Renal ROS  negative genitourinary   Musculoskeletal negative musculoskeletal ROS (+) Fibromyalgia -  Abdominal   Peds negative pediatric ROS (+)  Hematology negative hematology ROS (+)   Anesthesia Other Findings   Reproductive/Obstetrics negative OB ROS                             Anesthesia Physical Anesthesia Plan  ASA: III  Anesthesia Plan: General   Post-op Pain Management:    Induction: Intravenous  Airway Management Planned: Oral ETT  Additional Equipment:   Intra-op Plan:   Post-operative Plan: Extubation in OR  Informed Consent: I have reviewed the patients History and Physical, chart, labs and discussed the procedure including the risks, benefits and alternatives for the proposed anesthesia with the patient or authorized representative who has indicated his/her understanding and acceptance.   Dental advisory given  Plan Discussed with: CRNA  Anesthesia Plan Comments: (History of apiration. Will intubate.)        Anesthesia Quick Evaluation

## 2014-04-01 NOTE — Anesthesia Postprocedure Evaluation (Signed)
  Anesthesia Post-op Note  Patient: Vanessa Berg  Procedure(s) Performed: Procedure(s) (LRB): ESOPHAGOGASTRODUODENOSCOPY (EGD) WITH PROPOFOL (N/A) COLONOSCOPY WITH PROPOFOL (N/A)  Patient Location: PACU  Anesthesia Type: General  Level of Consciousness: awake and alert   Airway and Oxygen Therapy: Patient Spontanous Breathing  Post-op Pain: mild  Post-op Assessment: Post-op Vital signs reviewed, Patient's Cardiovascular Status Stable, Respiratory Function Stable, Patent Airway and No signs of Nausea or vomiting  Last Vitals:  Filed Vitals:   04/01/14 1310  BP: 103/67  Pulse: 87  Temp:   Resp: 15    Post-op Vital Signs: stable   Complications: No apparent anesthesia complications
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
## 2014-04-04 ENCOUNTER — Encounter (HOSPITAL_COMMUNITY): Payer: Self-pay | Admitting: Gastroenterology

## 2014-05-11 ENCOUNTER — Other Ambulatory Visit: Payer: Self-pay | Admitting: Medical

## 2014-05-18 ENCOUNTER — Telehealth: Payer: Self-pay | Admitting: Medical

## 2014-05-18 NOTE — Telephone Encounter (Signed)
Pt came in and brought proof of tb screening. She states we were waiting on that documentation in order to complete foster care forms. She is requesting that when forms are complete that they all be fazed to her at [phone]. Pt can be reached at [phone]. I am sending back to [place] due to [place] being out.

## 2014-05-18 NOTE — Telephone Encounter (Signed)
shane- I am not sure where the forms are to complete. I will place tb screening info from [facility] imagining in your folder so you can complete forms as i am not sure where they are.

## 2014-05-26 ENCOUNTER — Other Ambulatory Visit: Payer: Self-pay | Admitting: Medical

## 2014-06-05 ENCOUNTER — Other Ambulatory Visit: Payer: Self-pay | Admitting: Medical

## 2014-07-22 ENCOUNTER — Other Ambulatory Visit: Payer: Self-pay | Admitting: Medical

## 2014-07-22 NOTE — Telephone Encounter (Signed)
Is this okay to refill? 

## 2014-08-04 ENCOUNTER — Other Ambulatory Visit: Payer: Self-pay

## 2014-08-04 DIAGNOSIS — Z1231 Encounter for screening mammogram for malignant neoplasm of breast: Secondary | ICD-10-CM

## 2014-08-08 ENCOUNTER — Encounter: Payer: Self-pay | Admitting: Medical

## 2014-08-08 ENCOUNTER — Ambulatory Visit (INDEPENDENT_AMBULATORY_CARE_PROVIDER_SITE_OTHER): Payer: PRIVATE HEALTH INSURANCE | Admitting: Medical

## 2014-08-08 VITALS — BP 126/84 | HR 82 | Temp 97.7°F | Resp 16 | Wt 241.0 lb

## 2014-08-08 DIAGNOSIS — E038 Other specified hypothyroidism: Secondary | ICD-10-CM | POA: Diagnosis not present

## 2014-08-08 DIAGNOSIS — M797 Fibromyalgia: Secondary | ICD-10-CM | POA: Diagnosis not present

## 2014-08-08 DIAGNOSIS — R609 Edema, unspecified: Secondary | ICD-10-CM | POA: Diagnosis not present

## 2014-08-08 LAB — T4, FREE: Free T4: 0.94 ng/dL (ref 0.80–1.80)

## 2014-08-08 LAB — TSH: TSH: 19.366 u[IU]/mL — ABNORMAL HIGH (ref 0.350–4.500)
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
## 2014-08-09 ENCOUNTER — Other Ambulatory Visit: Payer: Self-pay | Admitting: Medical

## 2014-08-09 MED ORDER — SYNTHROID 125 MCG PO TABS: 125.0000 ug | ORAL_TABLET | Freq: Two times a day (BID) | ORAL | Status: DC

## 2014-08-19 ENCOUNTER — Ambulatory Visit: Payer: PRIVATE HEALTH INSURANCE

## 2014-08-23 ENCOUNTER — Ambulatory Visit: Payer: PRIVATE HEALTH INSURANCE

## 2014-08-27 ENCOUNTER — Telehealth: Payer: Self-pay | Admitting: Gastroenterology

## 2014-08-27 NOTE — Telephone Encounter (Signed)
On call note. Pt has had a few episodes of BRBPR over past few days with bowel movements. Had colon and EGD by Dr. Benson Norway in 03/2014 and both were unremarkable. States she called Dr. Ulyses Amor office yesterday and was advised to use RectiCare and rectal care instructions for presumed hemorrhoidal bleeding. Saw more rectal bleeding today and called.  Advised to use Anusol HC cream PR bid and DC RectiCare. Follow all rectal care instructions. Avoid strenuous activity for next few days. Call back or go to ED if bleeding worsens this weekend otherwise contact Dr. Ulyses Amor office on Monday for further plans.

## 2014-08-31 ENCOUNTER — Ambulatory Visit (INDEPENDENT_AMBULATORY_CARE_PROVIDER_SITE_OTHER): Payer: PRIVATE HEALTH INSURANCE | Admitting: Medical

## 2014-08-31 ENCOUNTER — Other Ambulatory Visit: Payer: Self-pay | Admitting: Medical

## 2014-08-31 ENCOUNTER — Encounter: Payer: Self-pay | Admitting: Medical

## 2014-08-31 VITALS — BP 100/80 | HR 68 | Temp 98.2°F | Resp 15 | Wt 234.0 lb

## 2014-08-31 DIAGNOSIS — R05 Cough: Secondary | ICD-10-CM

## 2014-08-31 DIAGNOSIS — J0100 Acute maxillary sinusitis, unspecified: Secondary | ICD-10-CM | POA: Diagnosis not present

## 2014-08-31 DIAGNOSIS — R059 Cough, unspecified: Secondary | ICD-10-CM

## 2014-08-31 MED ORDER — AMOXICILLIN 875 MG PO TABS: 875.0000 mg | ORAL_TABLET | Freq: Two times a day (BID) | ORAL | Status: DC

## 2014-08-31 MED ORDER — BENZONATATE 200 MG PO CAPS: 200.0000 mg | ORAL_CAPSULE | Freq: Three times a day (TID) | ORAL | Status: DC | PRN
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
## 2014-11-15 ENCOUNTER — Telehealth: Payer: Self-pay | Admitting: Medical

## 2014-11-15 ENCOUNTER — Other Ambulatory Visit: Payer: Self-pay | Admitting: Medical

## 2014-11-15 ENCOUNTER — Encounter: Payer: Self-pay | Admitting: Medical

## 2014-11-15 ENCOUNTER — Ambulatory Visit (INDEPENDENT_AMBULATORY_CARE_PROVIDER_SITE_OTHER): Payer: PRIVATE HEALTH INSURANCE | Admitting: Medical

## 2014-11-15 VITALS — BP 118/68 | HR 64 | Temp 97.7°F | Ht 60.5 in | Wt 238.0 lb

## 2014-11-15 DIAGNOSIS — R109 Unspecified abdominal pain: Secondary | ICD-10-CM | POA: Insufficient documentation

## 2014-11-15 DIAGNOSIS — I519 Heart disease, unspecified: Secondary | ICD-10-CM

## 2014-11-15 DIAGNOSIS — J4540 Moderate persistent asthma, uncomplicated: Secondary | ICD-10-CM | POA: Diagnosis not present

## 2014-11-15 DIAGNOSIS — E559 Vitamin D deficiency, unspecified: Secondary | ICD-10-CM | POA: Diagnosis not present

## 2014-11-15 DIAGNOSIS — E669 Obesity, unspecified: Secondary | ICD-10-CM

## 2014-11-15 DIAGNOSIS — I252 Old myocardial infarction: Secondary | ICD-10-CM | POA: Diagnosis not present

## 2014-11-15 DIAGNOSIS — R51 Headache: Secondary | ICD-10-CM | POA: Diagnosis not present

## 2014-11-15 DIAGNOSIS — R519 Headache, unspecified: Secondary | ICD-10-CM | POA: Insufficient documentation

## 2014-11-15 DIAGNOSIS — R3129 Other microscopic hematuria: Secondary | ICD-10-CM

## 2014-11-15 DIAGNOSIS — E038 Other specified hypothyroidism: Secondary | ICD-10-CM | POA: Diagnosis not present

## 2014-11-15 DIAGNOSIS — M1990 Unspecified osteoarthritis, unspecified site: Secondary | ICD-10-CM | POA: Diagnosis not present

## 2014-11-15 DIAGNOSIS — M797 Fibromyalgia: Secondary | ICD-10-CM | POA: Diagnosis not present

## 2014-11-15 DIAGNOSIS — Z889 Allergy status to unspecified drugs, medicaments and biological substances status: Secondary | ICD-10-CM | POA: Diagnosis not present

## 2014-11-15 DIAGNOSIS — R197 Diarrhea, unspecified: Secondary | ICD-10-CM | POA: Insufficient documentation

## 2014-11-15 DIAGNOSIS — G8929 Other chronic pain: Secondary | ICD-10-CM

## 2014-11-15 DIAGNOSIS — E785 Hyperlipidemia, unspecified: Secondary | ICD-10-CM | POA: Diagnosis not present

## 2014-11-15 DIAGNOSIS — K219 Gastro-esophageal reflux disease without esophagitis: Secondary | ICD-10-CM | POA: Insufficient documentation

## 2014-11-15 DIAGNOSIS — Z0000 Encounter for general adult medical examination without abnormal findings: Secondary | ICD-10-CM | POA: Diagnosis not present

## 2014-11-15 LAB — COMPREHENSIVE METABOLIC PANEL
ALT: 20 U/L (ref 6–29)
AST: 20 U/L (ref 10–35)
Albumin: 4.1 g/dL (ref 3.6–5.1)
Alkaline Phosphatase: 127 U/L (ref 33–130)
BUN: 12 mg/dL (ref 7–25)
CALCIUM: 9.2 mg/dL (ref 8.6–10.4)
CHLORIDE: 103 mmol/L (ref 98–110)
CO2: 28 mmol/L (ref 20–31)
Creat: 0.71 mg/dL (ref 0.50–1.05)
Glucose, Bld: 89 mg/dL (ref 65–99)
Potassium: 3.9 mmol/L (ref 3.5–5.3)
SODIUM: 140 mmol/L (ref 135–146)
Total Bilirubin: 0.5 mg/dL (ref 0.2–1.2)
Total Protein: 6.7 g/dL (ref 6.1–8.1)

## 2014-11-15 LAB — POCT URINALYSIS DIPSTICK
Bilirubin, UA: NEGATIVE
Glucose, UA: NEGATIVE
Ketones, UA: NEGATIVE
Leukocytes, UA: NEGATIVE
NITRITE UA: NEGATIVE
Protein, UA: NEGATIVE
RBC UA: 1
Urobilinogen, UA: NEGATIVE
pH, UA: 6

## 2014-11-15 LAB — CBC
HEMATOCRIT: 40.1 % (ref 36.0–46.0)
HEMOGLOBIN: 13 g/dL (ref 12.0–15.0)
MCH: 28.4 pg (ref 26.0–34.0)
MCHC: 32.4 g/dL (ref 30.0–36.0)
MCV: 87.7 fL (ref 78.0–100.0)
MPV: 10.4 fL (ref 8.6–12.4)
Platelets: 326 10*3/uL (ref 150–400)
RBC: 4.57 MIL/uL (ref 3.87–5.11)
RDW: 13.6 % (ref 11.5–15.5)
WBC: 6.6 10*3/uL (ref 4.0–10.5)

## 2014-11-15 LAB — T4, FREE: Free T4: 0.72 ng/dL — ABNORMAL LOW (ref 0.80–1.80)

## 2014-11-15 LAB — T3: T3, Total: 79.6 ng/dL — ABNORMAL LOW (ref 80.0–204.0)

## 2014-11-15 LAB — TSH: TSH: 26.471 u[IU]/mL — ABNORMAL HIGH (ref 0.350–4.500)

## 2014-11-15 MED ORDER — TRAMADOL HCL 50 MG PO TABS: 50.0000 mg | ORAL_TABLET | Freq: Four times a day (QID) | ORAL | Status: DC | PRN

## 2014-11-15 MED ORDER — TOPIRAMATE 50 MG PO TABS: 50.0000 mg | ORAL_TABLET | Freq: Two times a day (BID) | ORAL | Status: DC

## 2014-11-15 MED ORDER — ALBUTEROL SULFATE HFA 108 (90 BASE) MCG/ACT IN AERS
1.0000 | INHALATION_SPRAY | Freq: Four times a day (QID) | RESPIRATORY_TRACT | Status: DC | PRN
Start: 2014-11-15 — End: 2015-10-04

## 2014-11-15 MED ORDER — MOMETASONE FURO-FORMOTEROL FUM 100-5 MCG/ACT IN AERO: 2.0000 | INHALATION_SPRAY | Freq: Two times a day (BID) | RESPIRATORY_TRACT | Status: DC

## 2014-11-15 MED ORDER — GABAPENTIN 100 MG PO CAPS: 100.0000 mg | ORAL_CAPSULE | Freq: Three times a day (TID) | ORAL | Status: DC

## 2014-11-15 MED ORDER — ALBUTEROL SULFATE (2.5 MG/3ML) 0.083% IN NEBU: 2.5000 mg | INHALATION_SOLUTION | Freq: Four times a day (QID) | RESPIRATORY_TRACT | Status: DC | PRN

## 2014-11-15 MED ORDER — FUROSEMIDE 20 MG PO TABS: 20.0000 mg | ORAL_TABLET | Freq: Every day | ORAL | Status: DC
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
## 2014-11-15 NOTE — Patient Instructions (Addendum)
  Thank you for giving me the opportunity to serve you today.    Your diagnosis today includes: Encounter Diagnoses  Name Primary?  . Encounter for health maintenance examination in adult Yes  . Fibromyalgia   . Other specified hypothyroidism   . Extrinsic asthma, moderate persistent, uncomplicated   . Obesity   . Heart disease   . History of myocardial infarction   . Gastroesophageal reflux disease without esophagitis   . Chronic nonintractable headache, unspecified headache type   . Arthritis   . Hyperlipidemia   . Vitamin D deficiency   . History of allergy   . Right flank pain     For fibromyalgia Stop Lyrica  Begin Gabapentin 100mg .  Start once daily at bedtime for 3 days, then go to twice daily for 3 days.   And after this, if tolerating Gabapentin ok, you can go to 3 times daily  Hypothyroidism  After I get your lab results, lets have you use a different mail order pharmacy.  We will give you info on this where you may be able to get this for $25/month  Continue the rest of your medications as usual  General recommendations: See your eye doctor yearly for routine vision care. See your dentist yearly for routine dental care including hygiene visits twice yearly. See your gynecologist yearly for routine gynecological care. Go for mammogram! We will call with lab results I'll send your cholesterol medication once I see the lab results.

## 2014-11-15 NOTE — Telephone Encounter (Signed)
Left message for pt to advise Rx Tramadol ready for pick up

## 2014-11-16 ENCOUNTER — Telehealth: Payer: Self-pay | Admitting: Medical

## 2014-11-16 LAB — HEMOGLOBIN A1C
Hgb A1c MFr Bld: 5.5 % (ref <5.7)
MEAN PLASMA GLUCOSE: 111 mg/dL (ref <117)

## 2014-11-16 LAB — URINALYSIS, MICROSCOPIC ONLY
CRYSTALS: NONE SEEN [HPF]
Casts: NONE SEEN [LPF]
Yeast: NONE SEEN [HPF]

## 2014-11-16 LAB — VITAMIN D 25 HYDROXY (VIT D DEFICIENCY, FRACTURES): Vit D, 25-Hydroxy: 14 ng/mL — ABNORMAL LOW (ref 30–100)

## 2014-11-16 NOTE — Telephone Encounter (Signed)
Tramadol called into pharmacy per pt's request & Rx shredded

## 2014-11-18 LAB — CARDIO IQ(R) ADVANCED LIPID PANEL
APOLIPOPROTEIN (CARDIO IQ ADV LIPID PANEL): 105 mg/dL — AB (ref 49–103)
CHOLESTEROL, TOTAL (CARDIO IQ ADV LIPID PANEL): 217 mg/dL — AB (ref 125–200)
Cholesterol/HDL Ratio: 3.9 calc (ref <=5.0)
HDL CHOLESTEROL (CARDIO IQ ADV LIPID PANEL): 56 mg/dL (ref >=46)
LDL CHOLESTEROL CALCULATED (CARDIO IQ ADV LIPID PANEL): 134 mg/dL — AB
LDL Large: 6153 nmol/L (ref 5038–17886)
LDL Medium: 274 nmol/L (ref 121–397)
LDL Particle Number: 1484 nmol/L (ref 1016–2185)
LDL Peak Size: 226.7 Angstrom (ref >=218.2)
LDL Small: 162 nmol/L (ref 115–386)
Lipoprotein (a): 58 nmol/L (ref <75)
Non-HDL Cholesterol: 161 mg/dL
Triglycerides: 134 mg/dL

## 2014-11-21 ENCOUNTER — Other Ambulatory Visit: Payer: Self-pay | Admitting: Medical

## 2014-11-21 MED ORDER — ROSUVASTATIN CALCIUM 20 MG PO TABS: 20.0000 mg | ORAL_TABLET | Freq: Every day | ORAL | Status: DC

## 2014-12-01 ENCOUNTER — Telehealth: Payer: Self-pay | Admitting: Medical

## 2014-12-01 ENCOUNTER — Other Ambulatory Visit: Payer: Self-pay | Admitting: Medical

## 2014-12-01 MED ORDER — ROSUVASTATIN CALCIUM 40 MG PO TABS: 40.0000 mg | ORAL_TABLET | Freq: Every day | ORAL | Status: DC

## 2014-12-01 NOTE — Telephone Encounter (Signed)
cholesterol labs not at goal.   Lets increase Crestor to 40mg  daily.

## 2014-12-02 NOTE — Telephone Encounter (Signed)
Left detailed message on pt's vm that labs are not at goal and to increase crestor to 40mg  daily

## 2014-12-06 ENCOUNTER — Encounter: Payer: Self-pay | Admitting: Medical

## 2015-01-02 ENCOUNTER — Ambulatory Visit: Payer: Self-pay | Admitting: Nurse Practitioner

## 2015-02-01 ENCOUNTER — Ambulatory Visit
Admission: RE | Admit: 2015-02-01 | Discharge: 2015-02-01 | Disposition: A | Discharge status: Home / Self Care | Payer: PRIVATE HEALTH INSURANCE | Source: Ambulatory Visit

## 2015-02-01 DIAGNOSIS — Z1231 Encounter for screening mammogram for malignant neoplasm of breast: Secondary | ICD-10-CM

## 2015-02-28 ENCOUNTER — Telehealth: Payer: Self-pay | Admitting: Medical

## 2015-03-08 NOTE — Telephone Encounter (Signed)
Recv'd notice that Ruthe Mannan is on pt's list of covered drugs.  Called pharmacy & went thru for $50 & pt picked up

## 2015-10-04 ENCOUNTER — Encounter: Payer: Self-pay | Admitting: Medical

## 2015-10-04 ENCOUNTER — Ambulatory Visit (INDEPENDENT_AMBULATORY_CARE_PROVIDER_SITE_OTHER): Payer: PRIVATE HEALTH INSURANCE | Admitting: Medical

## 2015-10-04 VITALS — BP 120/70 | HR 76 | Temp 98.2°F | Resp 16 | Wt 241.6 lb

## 2015-10-04 DIAGNOSIS — E785 Hyperlipidemia, unspecified: Secondary | ICD-10-CM | POA: Diagnosis not present

## 2015-10-04 DIAGNOSIS — J4540 Moderate persistent asthma, uncomplicated: Secondary | ICD-10-CM

## 2015-10-04 DIAGNOSIS — Z889 Allergy status to unspecified drugs, medicaments and biological substances status: Secondary | ICD-10-CM | POA: Diagnosis not present

## 2015-10-04 DIAGNOSIS — F430 Acute stress reaction: Secondary | ICD-10-CM

## 2015-10-04 DIAGNOSIS — R51 Headache: Secondary | ICD-10-CM

## 2015-10-04 DIAGNOSIS — G4700 Insomnia, unspecified: Secondary | ICD-10-CM | POA: Diagnosis not present

## 2015-10-04 DIAGNOSIS — R21 Rash and other nonspecific skin eruption: Secondary | ICD-10-CM | POA: Diagnosis not present

## 2015-10-04 DIAGNOSIS — L259 Unspecified contact dermatitis, unspecified cause: Secondary | ICD-10-CM | POA: Diagnosis not present

## 2015-10-04 DIAGNOSIS — K219 Gastro-esophageal reflux disease without esophagitis: Secondary | ICD-10-CM

## 2015-10-04 DIAGNOSIS — E559 Vitamin D deficiency, unspecified: Secondary | ICD-10-CM | POA: Diagnosis not present

## 2015-10-04 DIAGNOSIS — M797 Fibromyalgia: Secondary | ICD-10-CM

## 2015-10-04 DIAGNOSIS — E038 Other specified hypothyroidism: Secondary | ICD-10-CM

## 2015-10-04 DIAGNOSIS — G8929 Other chronic pain: Secondary | ICD-10-CM

## 2015-10-04 DIAGNOSIS — I519 Heart disease, unspecified: Secondary | ICD-10-CM

## 2015-10-04 DIAGNOSIS — L500 Allergic urticaria: Secondary | ICD-10-CM | POA: Insufficient documentation

## 2015-10-04 LAB — CBC WITH DIFFERENTIAL/PLATELET
BASOS ABS: 0 {cells}/uL (ref 0–200)
BASOS PCT: 0 %
EOS ABS: 148 {cells}/uL (ref 15–500)
Eosinophils Relative: 2 %
HEMATOCRIT: 37.3 % (ref 35.0–45.0)
Hemoglobin: 12.7 g/dL (ref 11.7–15.5)
LYMPHS PCT: 37 %
Lymphs Abs: 2738 cells/uL (ref 850–3900)
MCH: 29.3 pg (ref 27.0–33.0)
MCHC: 34 g/dL (ref 32.0–36.0)
MCV: 85.9 fL (ref 80.0–100.0)
MONO ABS: 666 {cells}/uL (ref 200–950)
MPV: 10.6 fL (ref 7.5–12.5)
Monocytes Relative: 9 %
NEUTROS PCT: 52 %
Neutro Abs: 3848 cells/uL (ref 1500–7800)
Platelets: 269 10*3/uL (ref 140–400)
RBC: 4.34 MIL/uL (ref 3.80–5.10)
RDW: 13.7 % (ref 11.0–15.0)
WBC: 7.4 10*3/uL (ref 4.0–10.5)

## 2015-10-04 LAB — T4, FREE: Free T4: 1.2 ng/dL (ref 0.8–1.8)

## 2015-10-04 LAB — TSH: TSH: 2.56 mIU/L

## 2015-10-04 MED ORDER — ALPRAZOLAM 0.25 MG PO TABS: 0.2500 mg | ORAL_TABLET | Freq: Every evening | ORAL | 0 refills | Status: DC | PRN

## 2015-10-04 MED ORDER — TOPIRAMATE 50 MG PO TABS: 50.0000 mg | ORAL_TABLET | Freq: Two times a day (BID) | ORAL | 3 refills | Status: DC

## 2015-10-04 MED ORDER — ALBUTEROL SULFATE (2.5 MG/3ML) 0.083% IN NEBU: 2.5000 mg | INHALATION_SOLUTION | Freq: Four times a day (QID) | RESPIRATORY_TRACT | 2 refills | Status: DC | PRN

## 2015-10-04 MED ORDER — VITAMIN D (ERGOCALCIFEROL) 1.25 MG (50000 UNIT) PO CAPS: ORAL_CAPSULE | ORAL | 3 refills | Status: DC

## 2015-10-04 MED ORDER — ASPIRIN 81 MG PO TABS: 81.0000 mg | ORAL_TABLET | Freq: Every day | ORAL | 3 refills | Status: DC

## 2015-10-04 MED ORDER — AZELASTINE-FLUTICASONE 137-50 MCG/ACT NA SUSP: 1.0000 | Freq: Two times a day (BID) | NASAL | 11 refills | Status: DC | PRN

## 2015-10-04 MED ORDER — DEXILANT 60 MG PO CPDR: DELAYED_RELEASE_CAPSULE | ORAL | 1 refills | Status: DC

## 2015-10-04 MED ORDER — FUROSEMIDE 20 MG PO TABS: 20.0000 mg | ORAL_TABLET | Freq: Every day | ORAL | 3 refills | Status: DC

## 2015-10-04 MED ORDER — BETAMETHASONE VALERATE 0.1 % EX OINT: 1.0000 "application " | TOPICAL_OINTMENT | Freq: Two times a day (BID) | CUTANEOUS | 0 refills | Status: DC

## 2015-10-04 MED ORDER — MOMETASONE FURO-FORMOTEROL FUM 100-5 MCG/ACT IN AERO: 2.0000 | INHALATION_SPRAY | Freq: Two times a day (BID) | RESPIRATORY_TRACT | 3 refills | Status: DC

## 2015-10-04 MED ORDER — ALBUTEROL SULFATE HFA 108 (90 BASE) MCG/ACT IN AERS: 1.0000 | INHALATION_SPRAY | Freq: Four times a day (QID) | RESPIRATORY_TRACT | 1 refills | Status: DC | PRN

## 2015-10-04 MED ORDER — GABAPENTIN 100 MG PO CAPS: 100.0000 mg | ORAL_CAPSULE | Freq: Three times a day (TID) | ORAL | 3 refills | Status: DC
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
## 2015-10-05 ENCOUNTER — Other Ambulatory Visit: Payer: Self-pay | Admitting: Medical

## 2015-10-05 MED ORDER — SYNTHROID 125 MCG PO TABS: 125.0000 ug | ORAL_TABLET | Freq: Two times a day (BID) | ORAL | 3 refills | Status: DC

## 2015-10-10 ENCOUNTER — Telehealth: Payer: Self-pay | Admitting: Family Medicine

## 2015-10-10 NOTE — Telephone Encounter (Signed)
Pt returned call, I advised of results and [facility] request.

## 2015-12-08 ENCOUNTER — Ambulatory Visit (INDEPENDENT_AMBULATORY_CARE_PROVIDER_SITE_OTHER): Payer: PRIVATE HEALTH INSURANCE | Admitting: Family

## 2015-12-14 ENCOUNTER — Ambulatory Visit (INDEPENDENT_AMBULATORY_CARE_PROVIDER_SITE_OTHER): Payer: PRIVATE HEALTH INSURANCE | Admitting: Orthopaedic Surgery

## 2015-12-14 ENCOUNTER — Ambulatory Visit (INDEPENDENT_AMBULATORY_CARE_PROVIDER_SITE_OTHER): Payer: PRIVATE HEALTH INSURANCE

## 2015-12-14 ENCOUNTER — Ambulatory Visit (INDEPENDENT_AMBULATORY_CARE_PROVIDER_SITE_OTHER): Payer: Self-pay

## 2015-12-14 ENCOUNTER — Encounter (INDEPENDENT_AMBULATORY_CARE_PROVIDER_SITE_OTHER): Payer: Self-pay | Admitting: Orthopaedic Surgery

## 2015-12-14 VITALS — BP 116/80 | HR 68 | Ht 61.0 in | Wt 242.0 lb

## 2015-12-14 DIAGNOSIS — M25562 Pain in left knee: Secondary | ICD-10-CM | POA: Diagnosis not present

## 2015-12-14 DIAGNOSIS — G8929 Other chronic pain: Secondary | ICD-10-CM | POA: Diagnosis not present

## 2015-12-14 DIAGNOSIS — M25552 Pain in left hip: Secondary | ICD-10-CM
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
## 2015-12-26 ENCOUNTER — Encounter: Payer: Self-pay | Admitting: Medical

## 2015-12-26 ENCOUNTER — Ambulatory Visit (INDEPENDENT_AMBULATORY_CARE_PROVIDER_SITE_OTHER): Payer: PRIVATE HEALTH INSURANCE | Admitting: Medical

## 2015-12-26 VITALS — BP 144/92 | HR 66 | Ht 61.0 in | Wt 254.4 lb

## 2015-12-26 DIAGNOSIS — K2270 Barrett's esophagus without dysplasia: Secondary | ICD-10-CM

## 2015-12-26 DIAGNOSIS — Z23 Encounter for immunization: Secondary | ICD-10-CM

## 2015-12-26 DIAGNOSIS — J4540 Moderate persistent asthma, uncomplicated: Secondary | ICD-10-CM

## 2015-12-26 DIAGNOSIS — I519 Heart disease, unspecified: Secondary | ICD-10-CM | POA: Diagnosis not present

## 2015-12-26 DIAGNOSIS — Z0000 Encounter for general adult medical examination without abnormal findings: Secondary | ICD-10-CM

## 2015-12-26 DIAGNOSIS — F4323 Adjustment disorder with mixed anxiety and depressed mood: Secondary | ICD-10-CM

## 2015-12-26 DIAGNOSIS — E559 Vitamin D deficiency, unspecified: Secondary | ICD-10-CM

## 2015-12-26 DIAGNOSIS — E785 Hyperlipidemia, unspecified: Secondary | ICD-10-CM | POA: Diagnosis not present

## 2015-12-26 DIAGNOSIS — R0683 Snoring: Secondary | ICD-10-CM

## 2015-12-26 DIAGNOSIS — Z6841 Body Mass Index (BMI) 40.0 and over, adult: Secondary | ICD-10-CM

## 2015-12-26 DIAGNOSIS — M1990 Unspecified osteoarthritis, unspecified site: Secondary | ICD-10-CM

## 2015-12-26 DIAGNOSIS — I252 Old myocardial infarction: Secondary | ICD-10-CM

## 2015-12-26 DIAGNOSIS — G478 Other sleep disorders: Secondary | ICD-10-CM

## 2015-12-26 DIAGNOSIS — M797 Fibromyalgia: Secondary | ICD-10-CM | POA: Diagnosis not present

## 2015-12-26 DIAGNOSIS — T148XXA Other injury of unspecified body region, initial encounter: Secondary | ICD-10-CM

## 2015-12-26 DIAGNOSIS — E669 Obesity, unspecified: Secondary | ICD-10-CM

## 2015-12-26 DIAGNOSIS — H269 Unspecified cataract: Secondary | ICD-10-CM

## 2015-12-26 DIAGNOSIS — R519 Headache, unspecified: Secondary | ICD-10-CM

## 2015-12-26 DIAGNOSIS — G479 Sleep disorder, unspecified: Secondary | ICD-10-CM

## 2015-12-26 DIAGNOSIS — G4700 Insomnia, unspecified: Secondary | ICD-10-CM | POA: Diagnosis not present

## 2015-12-26 DIAGNOSIS — R51 Headache: Secondary | ICD-10-CM | POA: Diagnosis not present

## 2015-12-26 DIAGNOSIS — IMO0001 Reserved for inherently not codable concepts without codable children: Secondary | ICD-10-CM

## 2015-12-26 DIAGNOSIS — E038 Other specified hypothyroidism: Secondary | ICD-10-CM | POA: Diagnosis not present

## 2015-12-26 DIAGNOSIS — I10 Essential (primary) hypertension: Secondary | ICD-10-CM | POA: Insufficient documentation

## 2015-12-26 LAB — COMPREHENSIVE METABOLIC PANEL
ALBUMIN: 4.4 g/dL (ref 3.6–5.1)
ALT: 19 U/L (ref 6–29)
AST: 27 U/L (ref 10–35)
Alkaline Phosphatase: 131 U/L — ABNORMAL HIGH (ref 33–130)
BILIRUBIN TOTAL: 0.4 mg/dL (ref 0.2–1.2)
BUN: 15 mg/dL (ref 7–25)
CHLORIDE: 101 mmol/L (ref 98–110)
CO2: 28 mmol/L (ref 20–31)
CREATININE: 1.16 mg/dL — AB (ref 0.50–1.05)
Calcium: 9.5 mg/dL (ref 8.6–10.4)
GLUCOSE: 85 mg/dL (ref 65–99)
Potassium: 4.3 mmol/L (ref 3.5–5.3)
SODIUM: 138 mmol/L (ref 135–146)
Total Protein: 7.3 g/dL (ref 6.1–8.1)

## 2015-12-26 LAB — CBC WITH DIFFERENTIAL/PLATELET
BASOS ABS: 70 {cells}/uL (ref 0–200)
BASOS PCT: 1 %
EOS ABS: 70 {cells}/uL (ref 15–500)
Eosinophils Relative: 1 %
HEMATOCRIT: 41.7 % (ref 35.0–45.0)
Hemoglobin: 14 g/dL (ref 11.7–15.5)
LYMPHS PCT: 40 %
Lymphs Abs: 2800 cells/uL (ref 850–3900)
MCH: 28.8 pg (ref 27.0–33.0)
MCHC: 33.6 g/dL (ref 32.0–36.0)
MCV: 85.8 fL (ref 80.0–100.0)
MONO ABS: 560 {cells}/uL (ref 200–950)
MONOS PCT: 8 %
MPV: 10.4 fL (ref 7.5–12.5)
NEUTROS ABS: 3500 {cells}/uL (ref 1500–7800)
Neutrophils Relative %: 50 %
Platelets: 275 10*3/uL (ref 140–400)
RBC: 4.86 MIL/uL (ref 3.80–5.10)
RDW: 14.7 % (ref 11.0–15.0)
WBC: 7 10*3/uL (ref 4.0–10.5)

## 2015-12-26 LAB — POCT URINALYSIS DIPSTICK
BILIRUBIN UA: NEGATIVE
Blood, UA: NEGATIVE
GLUCOSE UA: NEGATIVE
KETONES UA: NEGATIVE
LEUKOCYTES UA: NEGATIVE
NITRITE UA: NEGATIVE
Protein, UA: NEGATIVE
Spec Grav, UA: >=1.03
Urobilinogen, UA: NEGATIVE
pH, UA: 6

## 2015-12-26 MED ORDER — ALPRAZOLAM 0.5 MG PO TABS: 0.5000 mg | ORAL_TABLET | Freq: Every evening | ORAL | 1 refills | Status: DC | PRN

## 2015-12-26 MED ORDER — LOSARTAN POTASSIUM-HCTZ 50-12.5 MG PO TABS: 1.0000 | ORAL_TABLET | Freq: Every day | ORAL | 1 refills | Status: DC

## 2015-12-26 MED ORDER — SYNTHROID 125 MCG PO TABS: 125.0000 ug | ORAL_TABLET | Freq: Two times a day (BID) | ORAL | 3 refills | Status: DC

## 2015-12-26 MED ORDER — TOPIRAMATE 50 MG PO TABS: ORAL_TABLET | ORAL | 1 refills | Status: DC

## 2015-12-26 MED ORDER — ROSUVASTATIN CALCIUM 40 MG PO TABS: 40.0000 mg | ORAL_TABLET | Freq: Every day | ORAL | 3 refills | Status: DC

## 2015-12-26 MED ORDER — MOMETASONE FURO-FORMOTEROL FUM 100-5 MCG/ACT IN AERO: 2.0000 | INHALATION_SPRAY | Freq: Two times a day (BID) | RESPIRATORY_TRACT | 11 refills | Status: DC

## 2015-12-26 MED ORDER — ASPIRIN 81 MG PO TABS: 81.0000 mg | ORAL_TABLET | Freq: Every day | ORAL | 3 refills | Status: AC

## 2015-12-26 MED ORDER — GABAPENTIN 100 MG PO CAPS: 100.0000 mg | ORAL_CAPSULE | Freq: Three times a day (TID) | ORAL | 3 refills | Status: DC

## 2015-12-26 MED ORDER — VITAMIN D (ERGOCALCIFEROL) 1.25 MG (50000 UNIT) PO CAPS
ORAL_CAPSULE | ORAL | 3 refills | Status: DC
Start: 2015-12-26 — End: 2016-04-17

## 2015-12-26 MED ORDER — ALBUTEROL SULFATE (2.5 MG/3ML) 0.083% IN NEBU: 2.5000 mg | INHALATION_SOLUTION | Freq: Four times a day (QID) | RESPIRATORY_TRACT | 2 refills | Status: DC | PRN

## 2015-12-26 MED ORDER — ALBUTEROL SULFATE HFA 108 (90 BASE) MCG/ACT IN AERS: 1.0000 | INHALATION_SPRAY | Freq: Four times a day (QID) | RESPIRATORY_TRACT | 1 refills | Status: DC | PRN

## 2015-12-26 NOTE — Patient Instructions (Addendum)
Recommendations:  Headaches  Begin Losartan HCT daily for blood pressure and swelling.  Lets stop Lasix fluid pill  Increase Topamax to 100mg  at night, continue 50mg  in the morning  I recommend a sleep study to further evaluate the headaches, sleep issues and elevated blood pressures  Please let me know ASAP when I can order this  Hypertension/high blood pressure/history of heart disease  Begin Losartan HCT for blood pressure and heart health.  Stop Lasix  Continue Aspirin 81mg  daily at bedtime  I recommend we use Crestor for cholesterol since it works the best, assuming its covered by insurance.    I didn't see your cholesterol medication listed today, so let us know what your cholesterol drug is at home if its different than Crestor  Depending upon your blood pressure and headaches in the next month, we may need to consider cardiology recheck.   The EKG is abnormal but not different than prior EKG  Hypothyroidism - continue your current medication  Asthma   Continue your current medications unless they are too expensive  If dulera to expensive, then call your insurance about similar options  Lets plan a breathing test called a pulmonary function study next visit  GERD, barretts esophagus - follow up with Dr. Benson Norway  Anxiety, mood changes  Consider counseling  Reduce stress where possible  Continue Xanax as need but I increased dose.  We may consider changing off Neurontin and using something else for mood and fibromyalgia next visit  Bruising   We will call with lab results  This could be related to side effect of some of your medications  Other preventatives care  See your eye doctor yearly for routine vision care.  See your dentist yearly for routine dental care including hygiene visits twice yearly.  See your gynecologist yearly for routine gynecological care.  Return in January for Tdap vaccine  Lets plan sleep study ASAP    Counseling  services  [facility] [address], [place], [place] [address] [phone]  [facility] [place], [place], [place] [address] [phone]  [facility]
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
## 2015-12-27 ENCOUNTER — Other Ambulatory Visit: Payer: Self-pay | Admitting: Medical

## 2015-12-27 LAB — VITAMIN D 25 HYDROXY (VIT D DEFICIENCY, FRACTURES): VIT D 25 HYDROXY: 13 ng/mL — AB (ref 30–100)

## 2015-12-27 LAB — APTT: aPTT: 28 s (ref 22–34)

## 2015-12-27 LAB — MICROALBUMIN / CREATININE URINE RATIO
Creatinine, Urine: 94 mg/dL (ref 20–320)
MICROALB UR: 0.2 mg/dL
Microalb Creat Ratio: 2 mcg/mg creat (ref <30)

## 2015-12-27 LAB — HEMOGLOBIN A1C
HEMOGLOBIN A1C: 5.5 % (ref <5.7)
Mean Plasma Glucose: 111 mg/dL

## 2015-12-27 LAB — PROTIME-INR
INR: 0.9
PROTHROMBIN TIME: 10.1 s (ref 9.0–11.5)

## 2016-03-21 ENCOUNTER — Ambulatory Visit (INDEPENDENT_AMBULATORY_CARE_PROVIDER_SITE_OTHER): Payer: PRIVATE HEALTH INSURANCE | Admitting: Orthopedic Surgery

## 2016-03-21 ENCOUNTER — Encounter (INDEPENDENT_AMBULATORY_CARE_PROVIDER_SITE_OTHER): Payer: Self-pay | Admitting: Orthopedic Surgery

## 2016-03-21 VITALS — BP 114/76 | HR 66 | Resp 14 | Ht 63.0 in | Wt 254.0 lb

## 2016-03-21 DIAGNOSIS — M25562 Pain in left knee: Secondary | ICD-10-CM | POA: Diagnosis not present

## 2016-03-21 DIAGNOSIS — E559 Vitamin D deficiency, unspecified: Secondary | ICD-10-CM

## 2016-03-21 MED ORDER — DICLOFENAC SODIUM 2 % TD SOLN: 2.0000 | Freq: Two times a day (BID) | TRANSDERMAL | 2 refills | Status: DC
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
## 2016-03-25 ENCOUNTER — Telehealth (INDEPENDENT_AMBULATORY_CARE_PROVIDER_SITE_OTHER): Payer: Self-pay | Admitting: Orthopaedic Surgery

## 2016-03-25 NOTE — Telephone Encounter (Signed)
Samon, Utilization nurse from [facility] left a message in regards to Vanessa Berg's MRI of her left knee that Aaron Edelman put in for. It is authorized and the reference #[id number].  Samon's number is [phone].  Thank you.

## 2016-03-26 NOTE — Telephone Encounter (Signed)
Called and received correct auth number.

## 2016-03-31 ENCOUNTER — Ambulatory Visit
Admission: RE | Admit: 2016-03-31 | Discharge: 2016-03-31 | Disposition: A | Discharge status: Home / Self Care | Payer: PRIVATE HEALTH INSURANCE | Source: Ambulatory Visit | Attending: Orthopedic Surgery | Admitting: Orthopedic Surgery

## 2016-04-08 ENCOUNTER — Ambulatory Visit (INDEPENDENT_AMBULATORY_CARE_PROVIDER_SITE_OTHER): Payer: PRIVATE HEALTH INSURANCE | Admitting: Orthopaedic Surgery

## 2016-04-11 ENCOUNTER — Telehealth (INDEPENDENT_AMBULATORY_CARE_PROVIDER_SITE_OTHER): Payer: Self-pay | Admitting: Orthopaedic Surgery

## 2016-04-11 NOTE — Telephone Encounter (Signed)
Patient called wanting to know the results of her MRI.  She has an appointment on March 26, but would like to know before than.  CB#[id number] (work).  Thank you.

## 2016-04-11 NOTE — Telephone Encounter (Signed)
See note below

## 2016-04-12 NOTE — Telephone Encounter (Signed)
Patient returned your call.

## 2016-04-12 NOTE — Telephone Encounter (Signed)
PW not in office Friday pm

## 2016-04-15 NOTE — Telephone Encounter (Signed)
called

## 2016-04-15 NOTE — Telephone Encounter (Signed)
Called and left message.

## 2016-04-17 ENCOUNTER — Encounter: Payer: Self-pay | Admitting: Family Medicine

## 2016-04-17 ENCOUNTER — Ambulatory Visit (INDEPENDENT_AMBULATORY_CARE_PROVIDER_SITE_OTHER): Payer: PRIVATE HEALTH INSURANCE | Admitting: Family Medicine

## 2016-04-17 ENCOUNTER — Ambulatory Visit
Admission: RE | Admit: 2016-04-17 | Discharge: 2016-04-17 | Disposition: A | Discharge status: Home / Self Care | Payer: PRIVATE HEALTH INSURANCE | Source: Ambulatory Visit | Attending: Family Medicine | Admitting: Family Medicine

## 2016-04-17 ENCOUNTER — Telehealth: Payer: Self-pay

## 2016-04-17 VITALS — BP 110/70 | HR 64 | Temp 97.6°F | Resp 16 | Wt 244.8 lb

## 2016-04-17 DIAGNOSIS — R350 Frequency of micturition: Secondary | ICD-10-CM

## 2016-04-17 DIAGNOSIS — R109 Unspecified abdominal pain: Secondary | ICD-10-CM

## 2016-04-17 DIAGNOSIS — R10821 Right upper quadrant rebound abdominal tenderness: Secondary | ICD-10-CM | POA: Diagnosis not present

## 2016-04-17 DIAGNOSIS — R10823 Right lower quadrant rebound abdominal tenderness: Secondary | ICD-10-CM | POA: Diagnosis not present

## 2016-04-17 LAB — CBC WITH DIFFERENTIAL/PLATELET
BASOS PCT: 1 %
Basophils Absolute: 78 cells/uL (ref 0–200)
Eosinophils Absolute: 156 cells/uL (ref 15–500)
Eosinophils Relative: 2 %
HEMATOCRIT: 40.4 % (ref 35.0–45.0)
HEMOGLOBIN: 13.5 g/dL (ref 11.7–15.5)
LYMPHS ABS: 2964 {cells}/uL (ref 850–3900)
Lymphocytes Relative: 38 %
MCH: 29.4 pg (ref 27.0–33.0)
MCHC: 33.4 g/dL (ref 32.0–36.0)
MCV: 88 fL (ref 80.0–100.0)
MONO ABS: 624 {cells}/uL (ref 200–950)
MPV: 10.6 fL (ref 7.5–12.5)
Monocytes Relative: 8 %
NEUTROS ABS: 3978 {cells}/uL (ref 1500–7800)
Neutrophils Relative %: 51 %
Platelets: 311 10*3/uL (ref 140–400)
RBC: 4.59 MIL/uL (ref 3.80–5.10)
RDW: 14.1 % (ref 11.0–15.0)
WBC: 7.8 10*3/uL (ref 4.0–10.5)

## 2016-04-17 LAB — POCT URINALYSIS DIPSTICK
Bilirubin, UA: NEGATIVE
GLUCOSE UA: NEGATIVE
Ketones, UA: NEGATIVE
LEUKOCYTES UA: NEGATIVE
NITRITE UA: NEGATIVE
Protein, UA: NEGATIVE
Spec Grav, UA: 1.03 (ref 1.030–1.035)
UROBILINOGEN UA: NEGATIVE (ref ?–2.0)
pH, UA: 6 (ref 5.0–8.0)

## 2016-04-17 LAB — COMPREHENSIVE METABOLIC PANEL
ALBUMIN: 4.3 g/dL (ref 3.6–5.1)
ALK PHOS: 128 U/L (ref 33–130)
ALT: 14 U/L (ref 6–29)
AST: 17 U/L (ref 10–35)
BILIRUBIN TOTAL: 0.3 mg/dL (ref 0.2–1.2)
BUN: 14 mg/dL (ref 7–25)
CALCIUM: 9.2 mg/dL (ref 8.6–10.4)
CO2: 29 mmol/L (ref 20–31)
Chloride: 104 mmol/L (ref 98–110)
Creat: 0.83 mg/dL (ref 0.50–1.05)
GLUCOSE: 85 mg/dL (ref 65–99)
Potassium: 4.2 mmol/L (ref 3.5–5.3)
SODIUM: 141 mmol/L (ref 135–146)
Total Protein: 7.2 g/dL (ref 6.1–8.1)

## 2016-04-17 MED ORDER — ONDANSETRON 4 MG PO TBDP: 4.0000 mg | ORAL_TABLET | Freq: Three times a day (TID) | ORAL | 0 refills | Status: DC | PRN

## 2016-04-17 MED ORDER — TRAMADOL HCL 50 MG PO TABS: 50.0000 mg | ORAL_TABLET | Freq: Three times a day (TID) | ORAL | 0 refills | Status: DC | PRN

## 2016-04-17 MED ORDER — TRAMADOL HCL 50 MG PO TABS
50.0000 mg | ORAL_TABLET | Freq: Three times a day (TID) | ORAL | 0 refills | Status: DC | PRN
Start: 2016-04-17 — End: 2016-04-17

## 2016-04-17 MED ORDER — IOPAMIDOL (ISOVUE-300) INJECTION 61%
125.0000 mL | Freq: Once | INTRAVENOUS | Status: AC | PRN
  Administered 2016-04-17: 125 mL via INTRAVENOUS

## 2016-04-17 MED ORDER — CIPROFLOXACIN HCL 500 MG PO TABS: 500.0000 mg | ORAL_TABLET | Freq: Two times a day (BID) | ORAL | 0 refills | Status: DC

## 2016-04-17 MED ORDER — KETOROLAC TROMETHAMINE 60 MG/2ML IM SOLN
60.0000 mg | Freq: Once | INTRAMUSCULAR | Status: AC
  Administered 2016-04-17: 60 mg via INTRAMUSCULAR
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
## 2016-04-17 NOTE — Telephone Encounter (Signed)
Per [facility]- no acute processes in ABD/PEL. Per Vickie- offer Toradol injection and vickie will treat for UTI. Pt made aware. She will stop by the office to get injection. Victorino December

## 2016-04-19 LAB — URINE CULTURE: Organism ID, Bacteria: NO GROWTH

## 2016-04-22 ENCOUNTER — Ambulatory Visit (INDEPENDENT_AMBULATORY_CARE_PROVIDER_SITE_OTHER): Payer: PRIVATE HEALTH INSURANCE | Admitting: Orthopaedic Surgery

## 2016-04-22 DIAGNOSIS — G8929 Other chronic pain: Secondary | ICD-10-CM | POA: Diagnosis not present

## 2016-04-22 DIAGNOSIS — M25561 Pain in right knee: Secondary | ICD-10-CM
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
## 2016-05-08 ENCOUNTER — Telehealth (INDEPENDENT_AMBULATORY_CARE_PROVIDER_SITE_OTHER): Payer: Self-pay | Admitting: *Deleted

## 2016-05-08 NOTE — Telephone Encounter (Signed)
Pt called checking on status of precertification for her Pennsaid, wants to know has it been done, and also wants to know what the hold up is on visco supplementation for the precertification. Pt would like a call on status.

## 2016-05-08 NOTE — Telephone Encounter (Signed)
I will call about the Pennsaid -not approved. If you can let me know about the visco I can explain that to her at the same time.Will call on THursday

## 2016-06-05 NOTE — Telephone Encounter (Signed)
new

## 2016-06-18 NOTE — Telephone Encounter (Signed)
Authorized through [employer], [place] # [id number], valid 06/18/16 through 07/09/16, this HAS to go through [facility], Euflexxa will send to [facility].  Will followup.

## 2016-06-18 NOTE — Telephone Encounter (Signed)
Submitted online Euflexxa now.

## 2016-06-18 NOTE — Telephone Encounter (Signed)
Faxed to Euflexxa for VOB, will followup.

## 2016-06-20 ENCOUNTER — Telehealth (INDEPENDENT_AMBULATORY_CARE_PROVIDER_SITE_OTHER): Payer: Self-pay | Admitting: Orthopaedic Surgery

## 2016-06-20 NOTE — Telephone Encounter (Signed)
Sent in error

## 2016-06-20 NOTE — Telephone Encounter (Signed)
Patient is inquiring about her Euflexxa injections.  I see the authorization number but was not sure if we could go ahead and schedule this or not.  XB#[phone].

## 2016-06-20 NOTE — Telephone Encounter (Signed)
Patient called inquiring about her Pennsaid.  She wants to know if the authorization from the Pharmacy has been signed.  YO#[phone].  Thank you.

## 2016-06-20 NOTE — Telephone Encounter (Signed)
lvmom for pt to call pharmacy to resend to correct fax

## 2016-06-20 NOTE — Telephone Encounter (Signed)
It has to go through specialty pharmacy, we cannot buy and bill, so I am waiting for [facility] to advise. If you will call her and advise for me?  [facility] should call her to discuss copay and then call us to arrange shipment.  Thanks-

## 2016-06-21 NOTE — Telephone Encounter (Signed)
Patient called this morning stating that the medication has to have authorization before [facility] will fill it and that she can not get the authorization that we have to get the authorization.  [phone].  Thank you.

## 2016-06-21 NOTE — Telephone Encounter (Signed)
I called the patient and let her know about the Euflexxa and that it has to go through the specialty pharmacy.

## 2016-06-26 ENCOUNTER — Telehealth (INDEPENDENT_AMBULATORY_CARE_PROVIDER_SITE_OTHER): Payer: Self-pay | Admitting: Orthopaedic Surgery

## 2016-06-26 NOTE — Telephone Encounter (Signed)
Tarmara from [employer] called to scheduled the delivery of Euflexxa for this patient.  CB#[phone].  Thank you.

## 2016-06-26 NOTE — Telephone Encounter (Signed)
Patient called back in regards to needing an update for her Euflexxa injections. She knows that these are supposed to be covered for three injections. The authorization runs out at June 12th and she still hasn't even had her first injection yet so she will need another authorization?  Her CB # [phone]. Thank you

## 2016-06-26 NOTE — Telephone Encounter (Signed)
Patient called this morning inquiring about her injections.  She states that she has not heard from the [facility].  She is concerned that her authorization for this injections will run out before she gets her injections. CB#[phone] before 5p.m.  She wants to know if she has to call them herself.

## 2016-06-27 NOTE — Telephone Encounter (Signed)
Patient calling again concerning an appointment for her injections. Stated she called Dr. Durward Fortes office and was told that you were working on Injections. CB# is [phone]) or [phone](cell).  Thank You.

## 2016-06-27 NOTE — Telephone Encounter (Signed)
This patient's Euflexxa will be delivered from [facility] June 5th, Tuesday by end of the day.  She says you all mentioned doing the first appt on 6/6, can you call her to schedule?  Please tell her that they did confirm that she paid for the medication. Thanks.

## 2016-06-27 NOTE — Telephone Encounter (Signed)
Please advise 

## 2016-06-27 NOTE — Telephone Encounter (Signed)
Please advise. Thank you

## 2016-06-28 NOTE — Telephone Encounter (Signed)
Left a message for patient to call the office back to schedule her Euflexxa injections.

## 2016-07-02 NOTE — Telephone Encounter (Signed)
Euflexxa right delivered to office 07/02/16, patient purchased, appts. W/Hans.

## 2016-07-10 ENCOUNTER — Ambulatory Visit (INDEPENDENT_AMBULATORY_CARE_PROVIDER_SITE_OTHER): Payer: PRIVATE HEALTH INSURANCE | Admitting: Orthopedic Surgery

## 2016-07-10 DIAGNOSIS — M1712 Unilateral primary osteoarthritis, left knee: Secondary | ICD-10-CM

## 2016-07-10 MED ORDER — LIDOCAINE HCL 1 % IJ SOLN
3.0000 mL | INTRAMUSCULAR | Status: AC | PRN
  Administered 2016-07-10: 3 mL

## 2016-07-10 MED ORDER — SODIUM HYALURONATE (VISCOSUP) 20 MG/2ML IX SOSY
20.0000 mg | PREFILLED_SYRINGE | INTRA_ARTICULAR | Status: AC | PRN
  Administered 2016-07-10: 20 mg via INTRA_ARTICULAR
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
## 2016-07-17 ENCOUNTER — Ambulatory Visit (INDEPENDENT_AMBULATORY_CARE_PROVIDER_SITE_OTHER): Payer: PRIVATE HEALTH INSURANCE | Admitting: Orthopedic Surgery

## 2016-07-17 DIAGNOSIS — M1712 Unilateral primary osteoarthritis, left knee: Secondary | ICD-10-CM | POA: Diagnosis not present

## 2016-07-17 MED ORDER — SODIUM HYALURONATE (VISCOSUP) 20 MG/2ML IX SOSY
20.0000 mg | PREFILLED_SYRINGE | INTRA_ARTICULAR | Status: AC | PRN
  Administered 2016-07-17: 20 mg via INTRA_ARTICULAR

## 2016-07-17 MED ORDER — LIDOCAINE HCL 1 % IJ SOLN
3.0000 mL | INTRAMUSCULAR | Status: AC | PRN
  Administered 2016-07-17: 3 mL
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
## 2016-07-24 ENCOUNTER — Ambulatory Visit (INDEPENDENT_AMBULATORY_CARE_PROVIDER_SITE_OTHER): Payer: PRIVATE HEALTH INSURANCE | Admitting: Orthopedic Surgery

## 2016-07-24 DIAGNOSIS — M1712 Unilateral primary osteoarthritis, left knee: Secondary | ICD-10-CM | POA: Diagnosis not present

## 2016-07-24 MED ORDER — DICLOFENAC SODIUM 1 % TD GEL: 2.0000 g | Freq: Four times a day (QID) | TRANSDERMAL | 2 refills | Status: DC

## 2016-07-24 MED ORDER — LIDOCAINE HCL 1 % IJ SOLN
3.0000 mL | INTRAMUSCULAR | Status: AC | PRN
  Administered 2016-07-24: 3 mL

## 2016-07-24 MED ORDER — SODIUM HYALURONATE (VISCOSUP) 20 MG/2ML IX SOSY
20.0000 mg | PREFILLED_SYRINGE | INTRA_ARTICULAR | Status: AC | PRN
  Administered 2016-07-24: 20 mg via INTRA_ARTICULAR
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
## 2016-08-14 ENCOUNTER — Other Ambulatory Visit: Payer: Self-pay | Admitting: Medical

## 2016-08-14 DIAGNOSIS — R51 Headache: Principal | ICD-10-CM

## 2016-08-14 DIAGNOSIS — G8929 Other chronic pain: Secondary | ICD-10-CM

## 2016-08-15 ENCOUNTER — Other Ambulatory Visit: Payer: Self-pay

## 2016-08-15 MED ORDER — ALPRAZOLAM 0.5 MG PO TABS: 0.5000 mg | ORAL_TABLET | Freq: Every evening | ORAL | 0 refills | Status: AC | PRN

## 2016-08-15 NOTE — Telephone Encounter (Signed)
Can she have a refill on this 

## 2016-08-15 NOTE — Telephone Encounter (Signed)
Call out/send 30 day of alprazolam and the 90 day supply of Topiramate and schedule for 50mo f/u visit/med check

## 2016-08-15 NOTE — Telephone Encounter (Signed)
Fax request recvd on Alprazolam as well to [email or web address]

## 2016-11-19 ENCOUNTER — Emergency Department (HOSPITAL_COMMUNITY): Payer: PRIVATE HEALTH INSURANCE

## 2016-11-19 ENCOUNTER — Inpatient Hospital Stay (HOSPITAL_COMMUNITY)
Admission: EM | Admit: 2016-11-19 | Discharge: 2016-11-22 | DRG: 313 | Disposition: A | Discharge status: Home / Self Care | Payer: PRIVATE HEALTH INSURANCE | Attending: Internal Medicine | Admitting: Internal Medicine

## 2016-11-19 ENCOUNTER — Other Ambulatory Visit: Payer: Self-pay | Admitting: Cardiology

## 2016-11-19 ENCOUNTER — Encounter (HOSPITAL_COMMUNITY): Payer: Self-pay | Admitting: *Deleted

## 2016-11-19 DIAGNOSIS — Z6841 Body Mass Index (BMI) 40.0 and over, adult: Secondary | ICD-10-CM | POA: Diagnosis not present

## 2016-11-19 DIAGNOSIS — Z886 Allergy status to analgesic agent status: Secondary | ICD-10-CM

## 2016-11-19 DIAGNOSIS — K580 Irritable bowel syndrome with diarrhea: Secondary | ICD-10-CM | POA: Diagnosis present

## 2016-11-19 DIAGNOSIS — K2270 Barrett's esophagus without dysplasia: Secondary | ICD-10-CM | POA: Diagnosis present

## 2016-11-19 DIAGNOSIS — R51 Headache: Secondary | ICD-10-CM | POA: Diagnosis not present

## 2016-11-19 DIAGNOSIS — E785 Hyperlipidemia, unspecified: Secondary | ICD-10-CM | POA: Diagnosis present

## 2016-11-19 DIAGNOSIS — R0789 Other chest pain: Principal | ICD-10-CM | POA: Diagnosis present

## 2016-11-19 DIAGNOSIS — Z79890 Hormone replacement therapy (postmenopausal): Secondary | ICD-10-CM | POA: Diagnosis not present

## 2016-11-19 DIAGNOSIS — Z79891 Long term (current) use of opiate analgesic: Secondary | ICD-10-CM

## 2016-11-19 DIAGNOSIS — R112 Nausea with vomiting, unspecified: Secondary | ICD-10-CM

## 2016-11-19 DIAGNOSIS — Z885 Allergy status to narcotic agent status: Secondary | ICD-10-CM

## 2016-11-19 DIAGNOSIS — E7800 Pure hypercholesterolemia, unspecified: Secondary | ICD-10-CM

## 2016-11-19 DIAGNOSIS — E039 Hypothyroidism, unspecified: Secondary | ICD-10-CM | POA: Diagnosis present

## 2016-11-19 DIAGNOSIS — R911 Solitary pulmonary nodule: Secondary | ICD-10-CM | POA: Diagnosis present

## 2016-11-19 DIAGNOSIS — Z87891 Personal history of nicotine dependence: Secondary | ICD-10-CM | POA: Diagnosis not present

## 2016-11-19 DIAGNOSIS — K219 Gastro-esophageal reflux disease without esophagitis: Secondary | ICD-10-CM | POA: Diagnosis present

## 2016-11-19 DIAGNOSIS — J4540 Moderate persistent asthma, uncomplicated: Secondary | ICD-10-CM | POA: Diagnosis present

## 2016-11-19 DIAGNOSIS — R1114 Bilious vomiting: Secondary | ICD-10-CM | POA: Diagnosis present

## 2016-11-19 DIAGNOSIS — Z9049 Acquired absence of other specified parts of digestive tract: Secondary | ICD-10-CM | POA: Diagnosis not present

## 2016-11-19 DIAGNOSIS — Z8249 Family history of ischemic heart disease and other diseases of the circulatory system: Secondary | ICD-10-CM

## 2016-11-19 DIAGNOSIS — M797 Fibromyalgia: Secondary | ICD-10-CM | POA: Diagnosis present

## 2016-11-19 DIAGNOSIS — I252 Old myocardial infarction: Secondary | ICD-10-CM

## 2016-11-19 DIAGNOSIS — Z881 Allergy status to other antibiotic agents status: Secondary | ICD-10-CM

## 2016-11-19 DIAGNOSIS — R1013 Epigastric pain: Secondary | ICD-10-CM

## 2016-11-19 DIAGNOSIS — R079 Chest pain, unspecified: Secondary | ICD-10-CM

## 2016-11-19 DIAGNOSIS — Z91040 Latex allergy status: Secondary | ICD-10-CM

## 2016-11-19 DIAGNOSIS — K449 Diaphragmatic hernia without obstruction or gangrene: Secondary | ICD-10-CM | POA: Diagnosis present

## 2016-11-19 DIAGNOSIS — E559 Vitamin D deficiency, unspecified: Secondary | ICD-10-CM | POA: Diagnosis present

## 2016-11-19 DIAGNOSIS — Z79899 Other long term (current) drug therapy: Secondary | ICD-10-CM

## 2016-11-19 DIAGNOSIS — I200 Unstable angina: Secondary | ICD-10-CM

## 2016-11-19 DIAGNOSIS — Z91030 Bee allergy status: Secondary | ICD-10-CM

## 2016-11-19 DIAGNOSIS — Z7982 Long term (current) use of aspirin: Secondary | ICD-10-CM

## 2016-11-19 DIAGNOSIS — Z973 Presence of spectacles and contact lenses: Secondary | ICD-10-CM

## 2016-11-19 DIAGNOSIS — Z888 Allergy status to other drugs, medicaments and biological substances status: Secondary | ICD-10-CM

## 2016-11-19 DIAGNOSIS — M1712 Unilateral primary osteoarthritis, left knee: Secondary | ICD-10-CM | POA: Diagnosis present

## 2016-11-19 DIAGNOSIS — I10 Essential (primary) hypertension: Secondary | ICD-10-CM | POA: Diagnosis present

## 2016-11-19 DIAGNOSIS — Z90710 Acquired absence of both cervix and uterus: Secondary | ICD-10-CM

## 2016-11-19 LAB — LIPASE, BLOOD: LIPASE: 23 U/L (ref 11–51)

## 2016-11-19 LAB — COMPREHENSIVE METABOLIC PANEL
ALT: 50 U/L (ref 14–54)
AST: 93 U/L — ABNORMAL HIGH (ref 15–41)
Albumin: 4 g/dL (ref 3.5–5.0)
Alkaline Phosphatase: 146 U/L — ABNORMAL HIGH (ref 38–126)
Anion gap: 10 (ref 5–15)
BUN: 12 mg/dL (ref 6–20)
CHLORIDE: 105 mmol/L (ref 101–111)
CO2: 25 mmol/L (ref 22–32)
CREATININE: 0.81 mg/dL (ref 0.44–1.00)
Calcium: 9.3 mg/dL (ref 8.9–10.3)
GFR calc non Af Amer: >60 mL/min (ref >60)
Glucose, Bld: 98 mg/dL (ref 65–99)
POTASSIUM: 4 mmol/L (ref 3.5–5.1)
Sodium: 140 mmol/L (ref 135–145)
Total Bilirubin: 1 mg/dL (ref 0.3–1.2)
Total Protein: 7.4 g/dL (ref 6.5–8.1)

## 2016-11-19 LAB — LIPID PANEL
CHOL/HDL RATIO: 3.4 ratio
Cholesterol: 193 mg/dL (ref 0–200)
HDL: 57 mg/dL (ref >40)
LDL Cholesterol: 118 mg/dL — ABNORMAL HIGH (ref 0–99)
Triglycerides: 88 mg/dL (ref <150)
VLDL: 18 mg/dL (ref 0–40)

## 2016-11-19 LAB — CBC WITH DIFFERENTIAL/PLATELET
Basophils Absolute: 0 10*3/uL (ref 0.0–0.1)
Basophils Relative: 0 %
EOS PCT: 1 %
Eosinophils Absolute: 0.1 10*3/uL (ref 0.0–0.7)
HCT: 40.3 % (ref 36.0–46.0)
Hemoglobin: 13.7 g/dL (ref 12.0–15.0)
LYMPHS ABS: 2.4 10*3/uL (ref 0.7–4.0)
LYMPHS PCT: 26 %
MCH: 29.2 pg (ref 26.0–34.0)
MCHC: 34 g/dL (ref 30.0–36.0)
MCV: 85.9 fL (ref 78.0–100.0)
MONO ABS: 0.5 10*3/uL (ref 0.1–1.0)
Monocytes Relative: 6 %
Neutro Abs: 6.2 10*3/uL (ref 1.7–7.7)
Neutrophils Relative %: 67 %
PLATELETS: 276 10*3/uL (ref 150–400)
RBC: 4.69 MIL/uL (ref 3.87–5.11)
RDW: 14.8 % (ref 11.5–15.5)
WBC: 9.3 10*3/uL (ref 4.0–10.5)

## 2016-11-19 LAB — TROPONIN I

## 2016-11-19 LAB — I-STAT TROPONIN, ED: TROPONIN I, POC: 0 ng/mL (ref 0.00–0.08)

## 2016-11-19 LAB — MRSA PCR SCREENING: MRSA by PCR: NEGATIVE

## 2016-11-19 MED ORDER — IOPAMIDOL (ISOVUE-370) INJECTION 76%
INTRAVENOUS | Status: AC
  Administered 2016-11-19: 12:00:00
  Filled 2016-11-19: qty 100

## 2016-11-19 MED ORDER — ONDANSETRON HCL 4 MG/2ML IJ SOLN
4.0000 mg | Freq: Four times a day (QID) | INTRAMUSCULAR | Status: DC | PRN
  Administered 2016-11-21: 4 mg via INTRAVENOUS
  Filled 2016-11-19 (×2): qty 2

## 2016-11-19 MED ORDER — MOMETASONE FURO-FORMOTEROL FUM 100-5 MCG/ACT IN AERO
2.0000 | INHALATION_SPRAY | Freq: Two times a day (BID) | RESPIRATORY_TRACT | Status: DC
  Administered 2016-11-19 – 2016-11-22 (×6): 2 via RESPIRATORY_TRACT
  Filled 2016-11-19: qty 8.8

## 2016-11-19 MED ORDER — HYDROMORPHONE HCL 1 MG/ML IJ SOLN
2.0000 mg | Freq: Once | INTRAMUSCULAR | Status: AC
  Administered 2016-11-19: 2 mg via INTRAVENOUS
  Filled 2016-11-19: qty 2

## 2016-11-19 MED ORDER — NITROGLYCERIN 2 % TD OINT
1.0000 [in_us] | TOPICAL_OINTMENT | Freq: Once | TRANSDERMAL | Status: AC
  Administered 2016-11-19: 1 [in_us] via TOPICAL
  Filled 2016-11-19: qty 1

## 2016-11-19 MED ORDER — IOPAMIDOL (ISOVUE-370) INJECTION 76%
100.0000 mL | Freq: Once | INTRAVENOUS | Status: AC | PRN
  Administered 2016-11-19: 100 mL via INTRAVENOUS

## 2016-11-19 MED ORDER — ACETAMINOPHEN 325 MG PO TABS: 650.0000 mg | ORAL_TABLET | ORAL | Status: DC | PRN

## 2016-11-19 MED ORDER — FAMOTIDINE IN NACL 20-0.9 MG/50ML-% IV SOLN
20.0000 mg | Freq: Two times a day (BID) | INTRAVENOUS | Status: DC
  Administered 2016-11-19 – 2016-11-22 (×7): 20 mg via INTRAVENOUS
  Filled 2016-11-19 (×8): qty 50

## 2016-11-19 MED ORDER — FLUTICASONE PROPIONATE 50 MCG/ACT NA SUSP: 1.0000 | Freq: Two times a day (BID) | NASAL | Status: DC | PRN

## 2016-11-19 MED ORDER — PANTOPRAZOLE SODIUM 40 MG IV SOLR
40.0000 mg | Freq: Two times a day (BID) | INTRAVENOUS | Status: DC
  Administered 2016-11-19 – 2016-11-22 (×6): 40 mg via INTRAVENOUS
  Filled 2016-11-19 (×6): qty 40

## 2016-11-19 MED ORDER — TRAMADOL HCL 50 MG PO TABS
50.0000 mg | ORAL_TABLET | Freq: Three times a day (TID) | ORAL | Status: DC | PRN
  Administered 2016-11-20 – 2016-11-21 (×3): 50 mg via ORAL
  Filled 2016-11-19 (×3): qty 1

## 2016-11-19 MED ORDER — ONDANSETRON HCL 4 MG/2ML IJ SOLN
4.0000 mg | Freq: Four times a day (QID) | INTRAMUSCULAR | Status: DC | PRN
  Administered 2016-11-19 (×2): 4 mg via INTRAVENOUS

## 2016-11-19 MED ORDER — FENTANYL CITRATE (PF) 100 MCG/2ML IJ SOLN
INTRAMUSCULAR | Status: AC
  Administered 2016-11-19: 100 ug
  Filled 2016-11-19: qty 2

## 2016-11-19 MED ORDER — ALBUTEROL SULFATE (2.5 MG/3ML) 0.083% IN NEBU: 2.5000 mg | INHALATION_SOLUTION | Freq: Four times a day (QID) | RESPIRATORY_TRACT | Status: DC | PRN

## 2016-11-19 MED ORDER — ONDANSETRON HCL 4 MG/2ML IJ SOLN
INTRAMUSCULAR | Status: AC
  Filled 2016-11-19: qty 2

## 2016-11-19 MED ORDER — GABAPENTIN 100 MG PO CAPS
100.0000 mg | ORAL_CAPSULE | Freq: Two times a day (BID) | ORAL | Status: DC
  Administered 2016-11-19 – 2016-11-22 (×6): 100 mg via ORAL
  Filled 2016-11-19 (×6): qty 1

## 2016-11-19 MED ORDER — ONDANSETRON HCL 4 MG/2ML IJ SOLN
4.0000 mg | Freq: Once | INTRAMUSCULAR | Status: AC
  Administered 2016-11-19: 4 mg via INTRAVENOUS
  Filled 2016-11-19: qty 2

## 2016-11-19 MED ORDER — HYDROMORPHONE HCL 1 MG/ML IJ SOLN
0.5000 mg | Freq: Once | INTRAMUSCULAR | Status: AC
  Administered 2016-11-19: 0.5 mg via INTRAVENOUS
  Filled 2016-11-19: qty 1

## 2016-11-19 MED ORDER — AZELASTINE-FLUTICASONE 137-50 MCG/ACT NA SUSP: 1.0000 | Freq: Two times a day (BID) | NASAL | Status: DC | PRN

## 2016-11-19 MED ORDER — ALBUTEROL SULFATE (2.5 MG/3ML) 0.083% IN NEBU: 3.0000 mL | INHALATION_SOLUTION | Freq: Four times a day (QID) | RESPIRATORY_TRACT | Status: DC | PRN

## 2016-11-19 MED ORDER — ROSUVASTATIN CALCIUM 10 MG PO TABS
40.0000 mg | ORAL_TABLET | Freq: Every day | ORAL | Status: DC
  Administered 2016-11-20 – 2016-11-21 (×2): 40 mg via ORAL
  Filled 2016-11-19 (×2): qty 4

## 2016-11-19 MED ORDER — LEVOTHYROXINE SODIUM 125 MCG PO TABS
125.0000 ug | ORAL_TABLET | Freq: Two times a day (BID) | ORAL | Status: DC
  Administered 2016-11-19 – 2016-11-22 (×6): 125 ug via ORAL
  Filled 2016-11-19 (×7): qty 1

## 2016-11-19 MED ORDER — NITROGLYCERIN IN D5W 200-5 MCG/ML-% IV SOLN
0.0000 ug/min | INTRAVENOUS | Status: DC
  Administered 2016-11-19: 5 ug/min via INTRAVENOUS

## 2016-11-19 MED ORDER — HEPARIN BOLUS VIA INFUSION
4000.0000 [IU] | Freq: Once | INTRAVENOUS | Status: AC
Start: 2016-11-19 — End: 2016-11-19
  Administered 2016-11-19: 4000 [IU] via INTRAVENOUS
  Filled 2016-11-19: qty 4000

## 2016-11-19 MED ORDER — PROMETHAZINE HCL 25 MG/ML IJ SOLN
25.0000 mg | Freq: Four times a day (QID) | INTRAMUSCULAR | Status: DC | PRN
  Administered 2016-11-19 – 2016-11-21 (×3): 25 mg via INTRAVENOUS
  Filled 2016-11-19 (×3): qty 1

## 2016-11-19 MED ORDER — NITROGLYCERIN IN D5W 200-5 MCG/ML-% IV SOLN
INTRAVENOUS | Status: AC
  Filled 2016-11-19: qty 250

## 2016-11-19 MED ORDER — HEPARIN (PORCINE) IN NACL 100-0.45 UNIT/ML-% IJ SOLN
1000.0000 [IU]/h | INTRAMUSCULAR | Status: DC
  Administered 2016-11-19: 1000 [IU]/h via INTRAVENOUS
  Filled 2016-11-19: qty 250

## 2016-11-19 MED ORDER — AZELASTINE HCL 0.1 % NA SOLN: 1.0000 | Freq: Two times a day (BID) | NASAL | Status: DC | PRN

## 2016-11-19 MED ORDER — METOCLOPRAMIDE HCL 5 MG/ML IJ SOLN
10.0000 mg | INTRAMUSCULAR | Status: AC
  Administered 2016-11-19: 10 mg via INTRAVENOUS
  Filled 2016-11-19: qty 2

## 2016-11-19 MED ORDER — NITROGLYCERIN 0.4 MG SL SUBL: 0.4000 mg | SUBLINGUAL_TABLET | SUBLINGUAL | Status: DC | PRN

## 2016-11-19 MED ORDER — ASPIRIN EC 81 MG PO TBEC
81.0000 mg | DELAYED_RELEASE_TABLET | Freq: Every day | ORAL | Status: DC
  Administered 2016-11-20 – 2016-11-22 (×3): 81 mg via ORAL
  Filled 2016-11-19 (×3): qty 1

## 2016-11-19 MED ORDER — ROSUVASTATIN CALCIUM 40 MG PO TABS
40.0000 mg | ORAL_TABLET | Freq: Every day | ORAL | Status: DC
  Filled 2016-11-19: qty 1

## 2016-11-19 MED ORDER — PROMETHAZINE HCL 25 MG/ML IJ SOLN: 25.0000 mg | Freq: Four times a day (QID) | INTRAMUSCULAR | Status: DC | PRN

## 2016-11-19 MED ORDER — GI COCKTAIL ~~LOC~~
30.0000 mL | Freq: Once | ORAL | Status: AC
  Administered 2016-11-19: 30 mL via ORAL
  Filled 2016-11-19: qty 30

## 2016-11-19 MED ORDER — SODIUM CHLORIDE 0.9 % IV SOLN
INTRAVENOUS | Status: DC
  Administered 2016-11-19 – 2016-11-21 (×5): via INTRAVENOUS

## 2016-11-19 MED ORDER — ALPRAZOLAM 0.5 MG PO TABS: 0.5000 mg | ORAL_TABLET | Freq: Every evening | ORAL | Status: DC | PRN

## 2016-11-19 MED ORDER — TOPIRAMATE 25 MG PO TABS
25.0000 mg | ORAL_TABLET | Freq: Two times a day (BID) | ORAL | Status: DC
  Administered 2016-11-19 – 2016-11-22 (×6): 25 mg via ORAL
  Filled 2016-11-19 (×6): qty 1

## 2016-11-19 NOTE — ED Notes (Signed)
Bed: WA10 Expected date:  Expected time:  Means of arrival:  Comments: EMS-abdominal pain 
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
## 2016-11-19 NOTE — ED Triage Notes (Signed)
Patient is alert and oriented x4.  She is coming from home with complaints of epigatric pain that started today.  Patient adds that she feels like an elephant is sitting on her chest.  Currently she rates her pain 10 of 10.
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
## 2016-11-20 ENCOUNTER — Inpatient Hospital Stay (HOSPITAL_COMMUNITY): Payer: PRIVATE HEALTH INSURANCE

## 2016-11-20 DIAGNOSIS — I200 Unstable angina: Secondary | ICD-10-CM

## 2016-11-20 LAB — CBC
HCT: 36.4 % (ref 36.0–46.0)
Hemoglobin: 12 g/dL (ref 12.0–15.0)
MCH: 28.8 pg (ref 26.0–34.0)
MCHC: 33 g/dL (ref 30.0–36.0)
MCV: 87.3 fL (ref 78.0–100.0)
PLATELETS: 237 10*3/uL (ref 150–400)
RBC: 4.17 MIL/uL (ref 3.87–5.11)
RDW: 15.2 % (ref 11.5–15.5)
WBC: 6.3 10*3/uL (ref 4.0–10.5)

## 2016-11-20 LAB — ECHOCARDIOGRAM COMPLETE
HEIGHTINCHES: 61 in
WEIGHTICAEL: 3840 [oz_av]

## 2016-11-20 LAB — TROPONIN I

## 2016-11-20 NOTE — Progress Notes (Signed)
Patient arrived on unit via bed from 2W.  No family at bedside.

## 2016-11-20 NOTE — Care Management Note (Signed)
Case Management Note  Patient Details  Name: Vanessa Berg MRN: [id number] Date of Birth: 1960-02-24  Subjective/Objective:                  Unstable chest pain,iv ntg drip  Action/Plan: Date:  November 20 2016 Chart reviewed for concurrent status and case management needs.  Will continue to follow patient progress.  Discharge Planning: following for needs  Expected discharge date: November 23, 2016  Velva Harman, BSN, [place], [place]   Expected Discharge Date:   (unknown)               Expected Discharge Plan:  Home/Self Care  In-House Referral:     Discharge planning Services  CM Consult  Post Acute Care Choice:    Choice offered to:     DME Arranged:    DME Agency:     HH Arranged:    HH Agency:     Status of Service:  In process, will continue to follow  If discussed at Long Length of Stay Meetings, dates discussed:    Additional Comments:  Leeroy Cha, RN 11/20/2016, 9:48 AM

## 2016-11-20 NOTE — Progress Notes (Signed)
  Echocardiogram 2D Echocardiogram has been performed.  Vanessa Berg L Androw 11/20/2016, 3:46 PM

## 2016-11-20 NOTE — Progress Notes (Signed)
PROGRESS NOTE    Vanessa Berg  EXB:[id number] DOB: May 31, 1960 DOA: 11/19/2016 PCP: Carlena Hurl, PA-C    Brief Narrative: 56 y/o morbidly obese female with HTN, HL with premature CAD in family (sister heart attack at age 55) presented to the ED with acute onset of substernal chest pain, spread around her chest, back and epigastric area. Admitted for evaluation of chest pain.   Assessment & Plan:   Principal Problem:   Chest pain Active Problems:   Asthma   Barrett's esophagus   Family history of premature coronary artery disease   Hypertension   Morbid obesity with BMI of 45.0-49.9, adult (HCC)   HLD (hyperlipidemia)   Hypothyroidism   Epigastric pain   Epigastric pain:  She was initially started on IV heparin and NTG , for suspected unstable angina. As her ekg does not show any ischemic changes, this is probably severe GERD vs upper gastroenteritis VS PUD.  IV PPI.  Her symptoms have improved and she is started on clear liquid diet.  Dr Benson Norway  Will be consulted in am.  Currently she denies any chest pain.  Cardiology consulted and recommendations given.  Echocardiogram reviewed.    Hypertension: well controlled.   Hypothyroidism:  Resume synthroid.        DVT prophylaxis: scd's Code Status: full code.  Family Communication: none at bedside.  Disposition Plan: pending further eval   Consultants:   cardiology  Procedures: none.    Antimicrobials: none.    Subjective: No chest pain.  Epigastric pain is resolved.   Objective: Vitals:   11/20/16 0900 11/20/16 1048 11/20/16 1100 11/20/16 1159  BP: (!) 103/53  131/79 105/64  Pulse: 64   65  Resp: 12  (!) 30   Temp:    98 F (36.7 C)  TempSrc:    Oral  SpO2: 98% 96% 98% 100%  Weight:      Height:        Intake/Output Summary (Last 24 hours) at 11/20/16 1746 Last data filed at 11/20/16 1400  Gross per 24 hour  Intake             2176 ml  Output              175 ml    Net             2001 ml   Filed Weights   11/19/16 0817  Weight: 108.9 kg (240 lb)    Examination:  General exam: Appears calm and comfortable  Respiratory system: Clear to auscultation. Respiratory effort normal. Cardiovascular system: S1 & S2 heard, RRR. No JVD, murmurs, rubs, gallops or clicks. No pedal edema. Gastrointestinal system: Abdomen is nondistended, soft and nontender. No organomegaly or masses felt. Normal bowel sounds heard. Central nervous system: Alert and oriented. No focal neurological deficits. Extremities: Symmetric 5 x 5 power. Skin: No rashes, lesions or ulcers Psychiatry: Judgement and insight appear normal. Mood & affect appropriate.     Data Reviewed: I have personally reviewed following labs and imaging studies  CBC:  Recent Labs Lab 11/19/16 0850 11/20/16 0018  WBC 9.3 6.3  NEUTROABS 6.2  --   HGB 13.7 12.0  HCT 40.3 36.4  MCV 85.9 87.3  PLT 276 102   Basic Metabolic Panel:  Recent Labs Lab 11/19/16 0850  NA 140  K 4.0  CL 105  CO2 25  GLUCOSE 98  BUN 12  CREATININE 0.81  CALCIUM 9.3   GFR: Estimated Creatinine Clearance: 88.4 mL/min (  by C-G formula based on SCr of 0.81 mg/dL). Liver Function Tests:  Recent Labs Lab 11/19/16 0850  AST 93*  ALT 50  ALKPHOS 146*  BILITOT 1.0  PROT 7.4  ALBUMIN 4.0    Recent Labs Lab 11/19/16 0850  LIPASE 23   No results for input(s): AMMONIA in the last 168 hours. Coagulation Profile: No results for input(s): INR, PROTIME in the last 168 hours. Cardiac Enzymes:  Recent Labs Lab 11/19/16 1230 11/19/16 1841 11/20/16 0018  TROPONINI <0.03 <0.03 <0.03   BNP (last 3 results) No results for input(s): PROBNP in the last 8760 hours. HbA1C: No results for input(s): HGBA1C in the last 72 hours. CBG: No results for input(s): GLUCAP in the last 168 hours. Lipid Profile:  Recent Labs  11/19/16 1230  CHOL 193  HDL 57  LDLCALC 118*  TRIG 88  CHOLHDL 3.4   Thyroid Function  Tests: No results for input(s): TSH, T4TOTAL, FREET4, T3FREE, THYROIDAB in the last 72 hours. Anemia Panel: No results for input(s): VITAMINB12, FOLATE, FERRITIN, TIBC, IRON, RETICCTPCT in the last 72 hours. Sepsis Labs: No results for input(s): PROCALCITON, LATICACIDVEN in the last 168 hours.  Recent Results (from the past 240 hour(s))  MRSA PCR Screening     Status: None   Collection Time: 11/19/16  2:59 PM  Result Value Ref Range Status   MRSA by PCR NEGATIVE NEGATIVE Final    Comment:        The GeneXpert MRSA Assay (FDA approved for NASAL specimens only), is one component of a comprehensive MRSA colonization surveillance program. It is not intended to diagnose MRSA infection nor to guide or monitor treatment for MRSA infections.          Radiology Studies: Dg Chest 2 View  Result Date: 11/19/2016 CLINICAL DATA:  Mid chest pain, shortness of Breath EXAM: CHEST  2 VIEW COMPARISON:  07/26/2012 FINDINGS: Low lung volumes. Heart is borderline in size, likely accentuated by the low volumes and AP nature of the study. No confluent opacities, effusions or edema. No acute bony abnormality. IMPRESSION: Low lung volumes.  Borderline heart size. Electronically Signed   By: Rolm Baptise M.D.   On: 11/19/2016 10:08   Ct Angio Chest/abd/pel For Dissection W And/or W/wo  Result Date: 11/19/2016 CLINICAL DATA:  Chest pain, epigastric pain EXAM: CT ANGIOGRAPHY CHEST, ABDOMEN AND PELVIS TECHNIQUE: Multidetector CT imaging through the chest, abdomen and pelvis was performed using the standard protocol during bolus administration of intravenous contrast. Multiplanar reconstructed images and MIPs were obtained and reviewed to evaluate the vascular anatomy. CONTRAST:  100 cc Isovue 370 IV COMPARISON:  CT abdomen and pelvis 04/17/2016.  CT chest 02/16/2013. FINDINGS: CTA CHEST FINDINGS Cardiovascular: Heart is borderline in size. Aorta is normal caliber. No dissection. No filling defects in the  pulmonary artery is to suggest pulmonary emboli. Mediastinum/Nodes: No mediastinal, hilar, or axillary adenopathy. Small to moderate-sized hiatal hernia. Trachea unremarkable. Lungs/Pleura: Low lung volumes with bibasilar atelectasis. No effusions. Musculoskeletal: No acute bony abnormality. Review of the MIP images confirms the above findings. CTA ABDOMEN AND PELVIS FINDINGS VASCULAR Aorta: Normal caliber.  No dissection. Celiac: Widely patent SMA: Widely patent Renals: Single bilaterally, widely patent IMA: Widely patent Inflow: Widely patent Veins: Grossly unremarkable. Review of the MIP images confirms the above findings. NON-VASCULAR Hepatobiliary: No focal liver abnormality is seen. Status post cholecystectomy. No biliary dilatation. Pancreas: No focal abnormality or ductal dilatation. Spleen: No focal abnormality.  Normal size. Adrenals/Urinary Tract: No adrenal abnormality. No  focal renal abnormality. No stones or hydronephrosis. Urinary bladder is unremarkable. Stomach/Bowel: Stomach, large and small bowel grossly unremarkable. Appendix normal. Lymphatic: No adenopathy. Reproductive: Prior hysterectomy.  No adnexal masses. Other: No free fluid or free air. Musculoskeletal: No acute bony abnormality. Review of the MIP images confirms the above findings. IMPRESSION: No evidence of aortic aneurysm or dissection. No evidence of pulmonary embolus. Small to moderate-sized hiatal hernia. Low lung volumes, bibasilar atelectasis. No acute findings in the chest, abdomen or pelvis. Electronically Signed   By: Rolm Baptise M.D.   On: 11/19/2016 11:39        Scheduled Meds: . aspirin EC  81 mg Oral Daily  . gabapentin  100 mg Oral BID  . levothyroxine  125 mcg Oral BID  . mometasone-formoterol  2 puff Inhalation BID  . pantoprazole (PROTONIX) IV  40 mg Intravenous Q12H  . rosuvastatin  40 mg Oral q1800  . topiramate  25 mg Oral BID   Continuous Infusions: . sodium chloride 100 mL/hr at 11/20/16 1232  .  famotidine (PEPCID) IV Stopped (11/20/16 1003)     LOS: 1 day    Time spent: 70 min    Langley Ingalls, MD [facility] Pager [phone]  If 7PM-7AM, please contact night-coverage [email or web address] Password [email or web address] 11/20/2016, 5:46 PM

## 2016-11-21 LAB — CBC
HEMATOCRIT: 37.7 % (ref 36.0–46.0)
HEMOGLOBIN: 12.3 g/dL (ref 12.0–15.0)
MCH: 28.8 pg (ref 26.0–34.0)
MCHC: 32.6 g/dL (ref 30.0–36.0)
MCV: 88.3 fL (ref 78.0–100.0)
Platelets: 250 10*3/uL (ref 150–400)
RBC: 4.27 MIL/uL (ref 3.87–5.11)
RDW: 15.4 % (ref 11.5–15.5)
WBC: 6.3 10*3/uL (ref 4.0–10.5)

## 2016-11-21 MED ORDER — METHYLPREDNISOLONE 4 MG PO TBPK
8.0000 mg | ORAL_TABLET | Freq: Every evening | ORAL | Status: AC
  Administered 2016-11-21: 8 mg via ORAL

## 2016-11-21 MED ORDER — TRAMADOL HCL 50 MG PO TABS
100.0000 mg | ORAL_TABLET | Freq: Three times a day (TID) | ORAL | Status: DC | PRN
  Administered 2016-11-21 – 2016-11-22 (×2): 100 mg via ORAL
  Filled 2016-11-21 (×2): qty 2

## 2016-11-21 MED ORDER — METHYLPREDNISOLONE 4 MG PO TBPK
4.0000 mg | ORAL_TABLET | ORAL | Status: AC
  Administered 2016-11-21: 4 mg via ORAL

## 2016-11-21 MED ORDER — METHYLPREDNISOLONE 4 MG PO TBPK: 8.0000 mg | ORAL_TABLET | Freq: Every evening | ORAL | Status: DC

## 2016-11-21 MED ORDER — METHYLPREDNISOLONE 4 MG PO TBPK
4.0000 mg | ORAL_TABLET | Freq: Three times a day (TID) | ORAL | Status: DC
  Administered 2016-11-22: 4 mg via ORAL

## 2016-11-21 MED ORDER — METHYLPREDNISOLONE 4 MG PO TBPK
8.0000 mg | ORAL_TABLET | Freq: Every morning | ORAL | Status: AC
  Administered 2016-11-21: 8 mg via ORAL
  Filled 2016-11-21: qty 21

## 2016-11-21 MED ORDER — METHYLPREDNISOLONE 4 MG PO TBPK: 4.0000 mg | ORAL_TABLET | Freq: Four times a day (QID) | ORAL | Status: DC

## 2016-11-21 NOTE — Progress Notes (Signed)
   Coronary CTA has been ordered to be done outpatient. The office is working on per certification with insurance company. The office will call patient in a week or 2 to schedule the test. She will follow up with our office after. I have explained this to the patient and she understands.   Vanessa Berg, AGNP-C [facility] 11/21/2016  10:26 AM Pager: [phone]

## 2016-11-21 NOTE — Progress Notes (Signed)
PROGRESS NOTE    Vanessa Berg  EXB:[id number] DOB: Jul 22, 1960 DOA: 11/19/2016 PCP: Carlena Hurl, PA-C    Brief Narrative: 56 y/o morbidly obese female with HTN, HL with premature CAD in family (sister heart attack at age 66) presented to the ED with acute onset of substernal chest pain, spread around her chest, back and epigastric area. Admitted for evaluation of chest pain.   Assessment & Plan:   Principal Problem:   Chest pain Active Problems:   Asthma   Barrett's esophagus   Family history of premature coronary artery disease   Hypertension   Morbid obesity with BMI of 45.0-49.9, adult (HCC)   HLD (hyperlipidemia)   Hypothyroidism   Epigastric pain   Epigastric pain:  She was initially started on IV heparin and NTG , for suspected unstable angina. As her ekg does not show any ischemic changes, this is probably severe GERD vs upper gastroenteritis VS PUD.  IV PPI. Transition to po PPI.  Her symptoms have improved and she is started on clear liquid diet. Advance as tolerated.  GI consulted for recommendations.  Currently she denies any chest pain.  Cardiology consulted and recommendations given.  Echocardiogram reviewed.    Hypertension: well controlled.   Hypothyroidism:  Resume synthroid.    Headache:  Increase tramadol.     DVT prophylaxis: scd's Code Status: full code.  Family Communication: none at bedside.  Disposition Plan: home in am if able to tolerate soft diet.    Consultants:   cardiology  Procedures: none.    Antimicrobials: none.    Subjective: Still reports some pain in the epigastric area.  Objective: Vitals:   11/21/16 0458 11/21/16 0809 11/21/16 0929 11/21/16 1300  BP: (!) 116/57 (!) 121/44  (!) 120/56  Pulse: 66 64  72  Resp: 18 16  16   Temp: 97.8 F (36.6 C) 98.3 F (36.8 C)  98.4 F (36.9 C)  TempSrc: Oral Oral  Oral  SpO2: 95% 96% 97% 94%  Weight:      Height:        Intake/Output Summary  (Last 24 hours) at 11/21/16 1642 Last data filed at 11/21/16 1329  Gross per 24 hour  Intake          2463.33 ml  Output                0 ml  Net          2463.33 ml   Filed Weights   11/19/16 0817  Weight: 108.9 kg (240 lb)    Examination: no change in exam.  General exam: Appears calm and comfortable  Respiratory system: Clear to auscultation. Respiratory effort normal. Cardiovascular system: S1 & S2 heard, RRR. No JVD, murmurs, rubs, gallops or clicks. No pedal edema. Gastrointestinal system: Abdomen is nondistended, soft and nontender. No organomegaly or masses felt. Normal bowel sounds heard. Central nervous system: Alert and oriented. No focal neurological deficits. Extremities: Symmetric 5 x 5 power. Skin: No rashes, lesions or ulcers Psychiatry: Judgement and insight appear normal. Mood & affect appropriate.     Data Reviewed: I have personally reviewed following labs and imaging studies  CBC:  Recent Labs Lab 11/19/16 0850 11/20/16 0018 11/21/16 0541  WBC 9.3 6.3 6.3  NEUTROABS 6.2  --   --   HGB 13.7 12.0 12.3  HCT 40.3 36.4 37.7  MCV 85.9 87.3 88.3  PLT 276 237 102   Basic Metabolic Panel:  Recent Labs Lab 11/19/16 0850  NA 140  K 4.0  CL 105  CO2 25  GLUCOSE 98  BUN 12  CREATININE 0.81  CALCIUM 9.3   GFR: Estimated Creatinine Clearance: 88.4 mL/min (by C-G formula based on SCr of 0.81 mg/dL). Liver Function Tests:  Recent Labs Lab 11/19/16 0850  AST 93*  ALT 50  ALKPHOS 146*  BILITOT 1.0  PROT 7.4  ALBUMIN 4.0    Recent Labs Lab 11/19/16 0850  LIPASE 23   No results for input(s): AMMONIA in the last 168 hours. Coagulation Profile: No results for input(s): INR, PROTIME in the last 168 hours. Cardiac Enzymes:  Recent Labs Lab 11/19/16 1230 11/19/16 1841 11/20/16 0018  TROPONINI <0.03 <0.03 <0.03   BNP (last 3 results) No results for input(s): PROBNP in the last 8760 hours. HbA1C: No results for input(s): HGBA1C in the  last 72 hours. CBG: No results for input(s): GLUCAP in the last 168 hours. Lipid Profile:  Recent Labs  11/19/16 1230  CHOL 193  HDL 57  LDLCALC 118*  TRIG 88  CHOLHDL 3.4   Thyroid Function Tests: No results for input(s): TSH, T4TOTAL, FREET4, T3FREE, THYROIDAB in the last 72 hours. Anemia Panel: No results for input(s): VITAMINB12, FOLATE, FERRITIN, TIBC, IRON, RETICCTPCT in the last 72 hours. Sepsis Labs: No results for input(s): PROCALCITON, LATICACIDVEN in the last 168 hours.  Recent Results (from the past 240 hour(s))  MRSA PCR Screening     Status: None   Collection Time: 11/19/16  2:59 PM  Result Value Ref Range Status   MRSA by PCR NEGATIVE NEGATIVE Final    Comment:        The GeneXpert MRSA Assay (FDA approved for NASAL specimens only), is one component of a comprehensive MRSA colonization surveillance program. It is not intended to diagnose MRSA infection nor to guide or monitor treatment for MRSA infections.          Radiology Studies: No results found.      Scheduled Meds: . aspirin EC  81 mg Oral Daily  . gabapentin  100 mg Oral BID  . levothyroxine  125 mcg Oral BID  . methylPREDNISolone  4 mg Oral PC supper  . [START ON 11/22/2016] methylPREDNISolone  4 mg Oral 3 x daily with food  . [START ON 11/23/2016] methylPREDNISolone  4 mg Oral 4X daily taper  . methylPREDNISolone  8 mg Oral AC breakfast  . methylPREDNISolone  8 mg Oral Nightly  . [START ON 11/22/2016] methylPREDNISolone  8 mg Oral Nightly  . mometasone-formoterol  2 puff Inhalation BID  . pantoprazole (PROTONIX) IV  40 mg Intravenous Q12H  . rosuvastatin  40 mg Oral q1800  . topiramate  25 mg Oral BID   Continuous Infusions: . sodium chloride 100 mL/hr at 11/21/16 0014  . famotidine (PEPCID) IV Stopped (11/21/16 1027)     LOS: 2 days    Time spent: 29 min    Dahna Hattabaugh, MD [facility] Pager [phone]  If 7PM-7AM, please contact  night-coverage [email or web address] Password [facility] 11/21/2016, 4:42 PM
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
## 2016-11-22 DIAGNOSIS — R079 Chest pain, unspecified: Secondary | ICD-10-CM

## 2016-11-22 LAB — CBC
HEMATOCRIT: 38.7 % (ref 36.0–46.0)
HEMOGLOBIN: 12.6 g/dL (ref 12.0–15.0)
MCH: 28.8 pg (ref 26.0–34.0)
MCHC: 32.6 g/dL (ref 30.0–36.0)
MCV: 88.4 fL (ref 78.0–100.0)
Platelets: 256 10*3/uL (ref 150–400)
RBC: 4.38 MIL/uL (ref 3.87–5.11)
RDW: 14.9 % (ref 11.5–15.5)
WBC: 8.4 10*3/uL (ref 4.0–10.5)

## 2016-11-22 MED ORDER — SUMATRIPTAN SUCCINATE 50 MG PO TABS
50.0000 mg | ORAL_TABLET | ORAL | Status: DC | PRN
  Administered 2016-11-22: 50 mg via ORAL
  Filled 2016-11-22: qty 1

## 2016-11-22 MED ORDER — METHYLPREDNISOLONE 4 MG PO TBPK: ORAL_TABLET | ORAL | 0 refills | Status: DC

## 2016-11-22 MED ORDER — SUMATRIPTAN SUCCINATE 50 MG PO TABS: 50.0000 mg | ORAL_TABLET | ORAL | 0 refills | Status: DC | PRN

## 2016-11-22 NOTE — Progress Notes (Signed)
Discharge instructions and medications discussed with patient.  AVS given to patient.  Prescription, prednisone taper pack, and letter given to patient.  All questions answered.

## 2016-11-22 NOTE — Progress Notes (Signed)
Subjective: Chest pain and upper abdominal pain better.  Still with some nausea.  Objective: Vital signs in last 24 hours: Temp:  [97.9 F (36.6 C)-98.5 F (36.9 C)] 97.9 F (36.6 C) (10/26 0552) Pulse Rate:  [75-79] 79 (10/26 0552) Resp:  [18] 18 (10/26 0552) BP: (105-135)/(53-68) 135/68 (10/26 0552) SpO2:  [93 %-96 %] 96 % (10/26 0851) Last BM Date: 11/18/16  Intake/Output from previous day: 10/25 0701 - 10/26 0700 In: 960 [P.O.:960] Out: -  Intake/Output this shift: Total I/O In: 993.3 [P.O.:240; I.V.:703.3; IV Piggyback:50] Out: -   General appearance: alert and no distress GI: improved costal margin pain  Lab Results:  Recent Labs  11/20/16 0018 11/21/16 0541 11/22/16 0542  WBC 6.3 6.3 8.4  HGB 12.0 12.3 12.6  HCT 36.4 37.7 38.7  PLT 237 250 256   BMET No results for input(s): NA, K, CL, CO2, GLUCOSE, BUN, CREATININE, CALCIUM in the last 72 hours. LFT No results for input(s): PROT, ALBUMIN, AST, ALT, ALKPHOS, BILITOT, BILIDIR, IBILI in the last 72 hours. PT/INR No results for input(s): LABPROT, INR in the last 72 hours. Hepatitis Panel No results for input(s): HEPBSAG, HCVAB, HEPAIGM, HEPBIGM in the last 72 hours. C-Diff No results for input(s): CDIFFTOX in the last 72 hours. Fecal Lactopherrin No results for input(s): FECLLACTOFRN in the last 72 hours.  Studies/Results: No results found.  Medications:  Scheduled: . aspirin EC  81 mg Oral Daily  . gabapentin  100 mg Oral BID  . levothyroxine  125 mcg Oral BID  . methylPREDNISolone  4 mg Oral 3 x daily with food  . [START ON 11/23/2016] methylPREDNISolone  4 mg Oral 4X daily taper  . methylPREDNISolone  8 mg Oral Nightly  . mometasone-formoterol  2 puff Inhalation BID  . pantoprazole (PROTONIX) IV  40 mg Intravenous Q12H  . rosuvastatin  40 mg Oral q1800  . topiramate  25 mg Oral BID   Continuous: . sodium chloride Stopped (11/22/16 1302)  . famotidine (PEPCID) IV Stopped (11/22/16 1002)     Assessment/Plan: 1) Musculoskeletal chest pain. 2) Costochondritis. 3) Diarrheal-predominant IBS.   The patient is less tender to palpation in the sternum and the costal margins.  No side effects with the use of the Medrol Dose Pack.  Plan: 1) Continue with Medrol Dose Pack. 2) Follow up in the office in 4 weeks. 3) Okay to Berg/C home from the GI standpoint.  LOS: 3 days   Vanessa Berg 11/22/2016, 1:52 PM

## 2016-11-25 ENCOUNTER — Telehealth: Payer: Self-pay | Admitting: Medical

## 2016-11-25 NOTE — Telephone Encounter (Signed)
Left message for pt to call concerning recent hospital stay. Needs a appt.

## 2016-12-02 NOTE — Discharge Summary (Addendum)
Physician Discharge Summary  Vanessa Berg IOE:[id number] DOB: [birth year] DOA: 11/19/2016  PCP: Carlena Hurl, PA-C  Admit date: 11/19/2016 Discharge date: 11/22/2016  Admitted From: Home Disposition: Home.   Recommendations for Outpatient Follow-up:  1. Follow up with PCP in 1-2 weeks 2. Please obtain BMP/CBC in one week Please follow up with cardiology as recommended.  Please follow up with gastroenterology as needed.   Discharge Condition: stable.  CODE STATUS: full code.  Diet recommendation: Heart Healthy  Brief/Interim Summary: 56 y/o morbidly obese female with HTN, HL with premature CAD in family (sister heart attack at age 72) presented to the ED with acute onset of substernal chest pain, spread around her chest, back and epigastric area. Admitted for evaluation of chest pain.     Discharge Diagnoses:  Principal Problem:   Chest pain Active Problems:   Asthma   Barrett's esophagus   Family history of premature coronary artery disease   Hypertension   Morbid obesity with BMI of 45.0-49.9, adult (HCC)   HLD (hyperlipidemia)   Hypothyroidism   Epigastric pain  Epigastric pain/ Chest pain:  She was initially started on IV heparin and NTG , for suspected unstable angina. As her ekg does not show any ischemic changes, this is probably severe GERD vs upper gastroenteritis VS PUD.  IV PPI. Transition to po PPI.  Her symptoms have improved and she is started on clear liquid diet. Advanced as tolerated.  GI consulted for recommendations. medrol dose pack ordered by GI for possible muscle pain. Currently she denies any chest pain.  Cardiology consulted and recommendations given.  Echocardiogram reviewed.  Recommended outpatient follow up with cardiology.    Hypertension: well controlled.   Hypothyroidism:  Resume synthroid.    Headache:  Increased  Tramadol. added sumatriptan. Headache resolved.       Discharge  Instructions  Discharge Instructions    Diet - low sodium heart healthy   Complete by:  As directed    Discharge instructions   Complete by:  As directed    Please follow up with dr Benson Norway as recommended.     Allergies as of 11/22/2016      Reactions   Bee Venom Anaphylaxis, Hives, Swelling   Swelling of throat and whole body    Morphine And Related Shortness Of Breath   Nsaids Anaphylaxis, Hives, Shortness Of Breath, Swelling   Swelling of eyes and throat    Tylenol [acetaminophen] Anaphylaxis, Hives, Shortness Of Breath   Difficulty breathing, hives, swelling   Ibuprofen Other (See Comments)   seizure   Latex Hives, Itching   Amoxicillin Other (See Comments)   Cymbalta [duloxetine Hcl]    nausea      Medication List    STOP taking these medications   ciprofloxacin 500 MG tablet Commonly known as:  CIPRO     TAKE these medications   albuterol 108 (90 Base) MCG/ACT inhaler Commonly known as:  PROVENTIL HFA Inhale 1 puff into the lungs every 6 (six) hours as needed for wheezing or shortness of breath. What changed:  Another medication with the same name was removed. Continue taking this medication, and follow the directions you see here.   ALPRAZolam 0.5 MG tablet Commonly known as:  XANAX Take 1 tablet (0.5 mg total) by mouth at bedtime as needed for anxiety.   aspirin 81 MG tablet Take 1 tablet (81 mg total) by mouth daily.   Azelastine-Fluticasone 137-50 MCG/ACT Susp Commonly known as:  DYMISTA Place 1 spray into the  nose 2 (two) times daily as needed (for runny nose).   BENADRYL ALLERGY PO Take 1 tablet by mouth daily as needed (hives/allergies).   betamethasone valerate ointment 0.1 % Commonly known as:  VALISONE Apply 1 application topically 2 (two) times daily.   clobetasol cream 0.05 % Commonly known as:  TEMOVATE Apply 1 application topically 2 (two) times daily.   DEXILANT 60 MG capsule Generic drug:  dexlansoprazole TK 1 C PO QD   diclofenac  sodium 1 % Gel Commonly known as:  VOLTAREN Apply 2-4 g topically 4 (four) times daily. What changed:    when to take this  reasons to take this  Another medication with the same name was removed. Continue taking this medication, and follow the directions you see here.   ergocalciferol 50000 units capsule Commonly known as:  VITAMIN D2 Take 50,000 Units by mouth every Monday.   gabapentin 100 MG capsule Commonly known as:  NEURONTIN Take 1 capsule (100 mg total) by mouth 3 (three) times daily. What changed:  when to take this   losartan-hydrochlorothiazide 50-12.5 MG tablet Commonly known as:  HYZAAR Take 1 tablet by mouth daily.   methylPREDNISolone 4 MG Tbpk tablet Commonly known as:  MEDROL DOSEPAK Use as its written on the label.   mometasone-formoterol 100-5 MCG/ACT Aero Commonly known as:  DULERA Inhale 2 puffs into the lungs 2 (two) times daily.   ondansetron 4 MG disintegrating tablet Commonly known as:  ZOFRAN ODT Take 1 tablet (4 mg total) by mouth every 8 (eight) hours as needed for nausea or vomiting.   rosuvastatin 40 MG tablet Commonly known as:  CRESTOR Take 1 tablet (40 mg total) by mouth daily.   SUMAtriptan 50 MG tablet Commonly known as:  IMITREX Take 1 tablet (50 mg total) by mouth every 2 (two) hours as needed for migraine or headache. May repeat in 2 hours if headache persists or recurs.   SYNTHROID 125 MCG tablet Generic drug:  levothyroxine Take 1 tablet (125 mcg total) by mouth 2 (two) times daily.   topiramate 50 MG tablet Commonly known as:  TOPAMAX TAKE 1 TABLET BY MOUTH IN THE MORNING AND 2 TABLETS IN THE EVENING   traMADol 50 MG tablet Commonly known as:  ULTRAM Take 1 tablet (50 mg total) by mouth every 8 (eight) hours as needed. What changed:  reasons to take this   VIBERZI 75 MG Tabs Generic drug:  Eluxadoline TK 1 T PO BID WF   VITAMIN C GUMMIE PO Take 1 capsule by mouth daily.      Follow-up Information    Tysinger,  Camelia Eng, PA-C. Schedule an appointment as soon as possible for a visit in 1 week(s).   Specialty:  Family Medicine Why:  follow up. Contact information: [address] [place] [address] [phone]        Carol Ada, MD Follow up.   Specialty:  Gastroenterology Why:  as recommended.  Contact information: [place], [place] [address] [phone]          Allergies  Allergen Reactions  . Bee Venom Anaphylaxis, Hives and Swelling    Swelling of throat and whole body   . Morphine And Related Shortness Of Breath  . Nsaids Anaphylaxis, Hives, Shortness Of Breath and Swelling    Swelling of eyes and throat   . Tylenol [Acetaminophen] Anaphylaxis, Hives and Shortness Of Breath    Difficulty breathing, hives, swelling  . Ibuprofen Other (See Comments)  seizure  . Latex Hives and Itching  . Amoxicillin Other (See Comments)  . Cymbalta [Duloxetine Hcl]     nausea    Consultations:  Gastroenterology.    Procedures/Studies: Dg Chest 2 View  Result Date: 11/19/2016 CLINICAL DATA:  Mid chest pain, shortness of Breath EXAM: CHEST  2 VIEW COMPARISON:  07/26/2012 FINDINGS: Low lung volumes. Heart is borderline in size, likely accentuated by the low volumes and AP nature of the study. No confluent opacities, effusions or edema. No acute bony abnormality. IMPRESSION: Low lung volumes.  Borderline heart size. Electronically Signed   By: Rolm Baptise M.D.   On: 11/19/2016 10:08   Ct Angio Chest/abd/pel For Dissection W And/or W/wo  Result Date: 11/19/2016 CLINICAL DATA:  Chest pain, epigastric pain EXAM: CT ANGIOGRAPHY CHEST, ABDOMEN AND PELVIS TECHNIQUE: Multidetector CT imaging through the chest, abdomen and pelvis was performed using the standard protocol during bolus administration of intravenous contrast. Multiplanar reconstructed images and MIPs were obtained and reviewed to evaluate the vascular anatomy. CONTRAST:  100 cc Isovue 370 IV  COMPARISON:  CT abdomen and pelvis 04/17/2016.  CT chest 02/16/2013. FINDINGS: CTA CHEST FINDINGS Cardiovascular: Heart is borderline in size. Aorta is normal caliber. No dissection. No filling defects in the pulmonary artery is to suggest pulmonary emboli. Mediastinum/Nodes: No mediastinal, hilar, or axillary adenopathy. Small to moderate-sized hiatal hernia. Trachea unremarkable. Lungs/Pleura: Low lung volumes with bibasilar atelectasis. No effusions. Musculoskeletal: No acute bony abnormality. Review of the MIP images confirms the above findings. CTA ABDOMEN AND PELVIS FINDINGS VASCULAR Aorta: Normal caliber.  No dissection. Celiac: Widely patent SMA: Widely patent Renals: Single bilaterally, widely patent IMA: Widely patent Inflow: Widely patent Veins: Grossly unremarkable. Review of the MIP images confirms the above findings. NON-VASCULAR Hepatobiliary: No focal liver abnormality is seen. Status post cholecystectomy. No biliary dilatation. Pancreas: No focal abnormality or ductal dilatation. Spleen: No focal abnormality.  Normal size. Adrenals/Urinary Tract: No adrenal abnormality. No focal renal abnormality. No stones or hydronephrosis. Urinary bladder is unremarkable. Stomach/Bowel: Stomach, large and small bowel grossly unremarkable. Appendix normal. Lymphatic: No adenopathy. Reproductive: Prior hysterectomy.  No adnexal masses. Other: No free fluid or free air. Musculoskeletal: No acute bony abnormality. Review of the MIP images confirms the above findings. IMPRESSION: No evidence of aortic aneurysm or dissection. No evidence of pulmonary embolus. Small to moderate-sized hiatal hernia. Low lung volumes, bibasilar atelectasis. No acute findings in the chest, abdomen or pelvis. Electronically Signed   By: Rolm Baptise M.D.   On: 11/19/2016 11:39       Subjective: No new complaints.   Discharge Exam: Vitals:   11/22/16 0552 11/22/16 0851  BP: 135/68   Pulse: 79   Resp: 18   Temp: 97.9 F (36.6  C)   SpO2: 93% 96%   Vitals:   11/21/16 1949 11/21/16 2101 11/22/16 0552 11/22/16 0851  BP:  (!) 105/53 135/68   Pulse:  75 79   Resp:  18 18   Temp:  98.5 F (36.9 C) 97.9 F (36.6 C)   TempSrc:  Oral Oral   SpO2: 95% 93% 93% 96%  Weight:      Height:        General: Pt is alert, awake, not in acute distress Cardiovascular: RRR, S1/S2 +, no rubs, no gallops Respiratory: CTA bilaterally, no wheezing, no rhonchi Abdominal: Soft, NT, ND, bowel sounds + Extremities: no edema, no cyanosis    The results of significant diagnostics from this hospitalization (including imaging, microbiology, ancillary and  laboratory) are listed below for reference.     Microbiology: No results found for this or any previous visit (from the past 240 hour(s)).   Labs: BNP (last 3 results) No results for input(s): BNP in the last 8760 hours. Basic Metabolic Panel: No results for input(s): NA, K, CL, CO2, GLUCOSE, BUN, CREATININE, CALCIUM, MG, PHOS in the last 168 hours. Liver Function Tests: No results for input(s): AST, ALT, ALKPHOS, BILITOT, PROT, ALBUMIN in the last 168 hours. No results for input(s): LIPASE, AMYLASE in the last 168 hours. No results for input(s): AMMONIA in the last 168 hours. CBC: No results for input(s): WBC, NEUTROABS, HGB, HCT, MCV, PLT in the last 168 hours. Cardiac Enzymes: No results for input(s): CKTOTAL, CKMB, CKMBINDEX, TROPONINI in the last 168 hours. BNP: Invalid input(s): POCBNP CBG: No results for input(s): GLUCAP in the last 168 hours. D-Dimer No results for input(s): DDIMER in the last 72 hours. Hgb A1c No results for input(s): HGBA1C in the last 72 hours. Lipid Profile No results for input(s): CHOL, HDL, LDLCALC, TRIG, CHOLHDL, LDLDIRECT in the last 72 hours. Thyroid function studies No results for input(s): TSH, T4TOTAL, T3FREE, THYROIDAB in the last 72 hours.  Invalid input(s): FREET3 Anemia work up No results for input(s): VITAMINB12, FOLATE,  FERRITIN, TIBC, IRON, RETICCTPCT in the last 72 hours. Urinalysis    Component Value Date/Time   BILIRUBINUR n 04/17/2016 1119   PROTEINUR n 04/17/2016 1119   UROBILINOGEN negative 04/17/2016 1119   NITRITE n 04/17/2016 1119   LEUKOCYTESUR Negative 04/17/2016 1119   Sepsis Labs Invalid input(s): PROCALCITONIN,  WBC,  LACTICIDVEN Microbiology No results found for this or any previous visit (from the past 240 hour(s)).   Time coordinating discharge: Over 30 minutes  SIGNED:   Hosie Poisson, MD  [facility] 12/02/2016, 11:13 PM Pager   If 7PM-7AM, please contact night-coverage [email or web address] Password [email or web address]

## 2016-12-11 ENCOUNTER — Telehealth: Payer: Self-pay | Admitting: *Deleted

## 2016-12-13 NOTE — Telephone Encounter (Signed)
Patients Cardiac CTA denied by insurance and Dr Oval Linsey is ok with changing to stress test. Left message to call back for patient to call back to see if stress or lexiscan myoview appropriate

## 2016-12-16 ENCOUNTER — Other Ambulatory Visit: Payer: Self-pay | Admitting: Medical

## 2016-12-16 DIAGNOSIS — R519 Headache, unspecified: Secondary | ICD-10-CM

## 2016-12-16 DIAGNOSIS — R51 Headache: Secondary | ICD-10-CM

## 2016-12-16 DIAGNOSIS — J4540 Moderate persistent asthma, uncomplicated: Secondary | ICD-10-CM

## 2016-12-17 ENCOUNTER — Telehealth: Payer: Self-pay | Admitting: Medical

## 2016-12-17 ENCOUNTER — Other Ambulatory Visit: Payer: Self-pay | Admitting: Medical

## 2016-12-17 NOTE — Telephone Encounter (Signed)
Rcvd refill request for Alprazolam 0.5 mg #30

## 2016-12-17 NOTE — Telephone Encounter (Signed)
Called pt l/m for pt call us back to set up and appt.

## 2016-12-17 NOTE — Telephone Encounter (Signed)
Call and get her in for either hospital follow up OR physical.

## 2016-12-17 NOTE — Telephone Encounter (Signed)
Called and l/m for pt to call us back to set up an appt

## 2016-12-17 NOTE — Telephone Encounter (Signed)
Called and l/m for pt call us back to set up an appt. For refill on meds.

## 2016-12-20 ENCOUNTER — Other Ambulatory Visit: Payer: Self-pay | Admitting: Medical

## 2016-12-20 DIAGNOSIS — J4540 Moderate persistent asthma, uncomplicated: Secondary | ICD-10-CM

## 2016-12-23 NOTE — Telephone Encounter (Signed)
Called and l/m for pt call us back to set up an appt.

## 2016-12-24 ENCOUNTER — Encounter: Payer: Self-pay | Admitting: Physician Assistant

## 2016-12-24 ENCOUNTER — Ambulatory Visit (INDEPENDENT_AMBULATORY_CARE_PROVIDER_SITE_OTHER): Payer: PRIVATE HEALTH INSURANCE | Admitting: Physician Assistant

## 2016-12-24 ENCOUNTER — Other Ambulatory Visit: Payer: Self-pay | Admitting: Physician Assistant

## 2016-12-24 VITALS — BP 126/78 | HR 72 | Ht 61.0 in | Wt 239.2 lb

## 2016-12-24 DIAGNOSIS — R079 Chest pain, unspecified: Secondary | ICD-10-CM | POA: Diagnosis not present

## 2016-12-24 DIAGNOSIS — R002 Palpitations: Secondary | ICD-10-CM

## 2016-12-24 NOTE — Patient Instructions (Addendum)
Your physician recommends that you continue on your current medications as directed. Please refer to the Current Medication list given to you today.  Your physician has recommended that you wear a holter monitor. Holter monitors are medical devices that record the heart's electrical activity. Doctors most often use these monitors to diagnose arrhythmias. Arrhythmias are problems with the speed or rhythm of the heartbeat. The monitor is a small, portable device. You can wear one while you do your normal daily activities. This is usually used to diagnose what is causing palpitations/syncope (passing out).   [place] has requested that you have a lexiscan myoview. For further information please visit [email or web address]. Please follow instruction sheet, as given.   Your physician recommends that you schedule a follow-up appointment in: [facility]
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
## 2016-12-26 NOTE — Telephone Encounter (Signed)
Patient seen in the office by Donnie Aho PA and myoview scheduled

## 2017-01-03 ENCOUNTER — Encounter (HOSPITAL_COMMUNITY): Payer: PRIVATE HEALTH INSURANCE

## 2017-01-16 ENCOUNTER — Inpatient Hospital Stay (HOSPITAL_COMMUNITY)
Admission: RE | Admit: 2017-01-16 | Payer: PRIVATE HEALTH INSURANCE | Source: Ambulatory Visit | Attending: Physician Assistant | Admitting: Physician Assistant

## 2017-01-17 ENCOUNTER — Inpatient Hospital Stay (HOSPITAL_COMMUNITY)
Admission: RE | Admit: 2017-01-17 | Payer: PRIVATE HEALTH INSURANCE | Source: Ambulatory Visit | Attending: Physician Assistant | Admitting: Physician Assistant

## 2017-01-24 ENCOUNTER — Ambulatory Visit: Payer: PRIVATE HEALTH INSURANCE | Admitting: Cardiovascular Disease

## 2017-01-29 DIAGNOSIS — I219 Acute myocardial infarction, unspecified: Secondary | ICD-10-CM | POA: Insufficient documentation

## 2017-01-29 DIAGNOSIS — K219 Gastro-esophageal reflux disease without esophagitis: Secondary | ICD-10-CM | POA: Insufficient documentation

## 2017-02-04 ENCOUNTER — Other Ambulatory Visit: Payer: Self-pay | Admitting: Medical

## 2017-02-04 DIAGNOSIS — J4540 Moderate persistent asthma, uncomplicated: Secondary | ICD-10-CM

## 2017-02-05 ENCOUNTER — Other Ambulatory Visit: Payer: Self-pay | Admitting: Internal Medicine

## 2017-02-05 DIAGNOSIS — M542 Cervicalgia: Secondary | ICD-10-CM

## 2017-02-06 ENCOUNTER — Ambulatory Visit
Admission: RE | Admit: 2017-02-06 | Discharge: 2017-02-06 | Disposition: A | Discharge status: Home / Self Care | Payer: PRIVATE HEALTH INSURANCE | Source: Ambulatory Visit | Attending: Internal Medicine | Admitting: Internal Medicine

## 2017-02-06 DIAGNOSIS — M542 Cervicalgia: Secondary | ICD-10-CM

## 2017-02-10 ENCOUNTER — Ambulatory Visit (INDEPENDENT_AMBULATORY_CARE_PROVIDER_SITE_OTHER): Payer: PRIVATE HEALTH INSURANCE

## 2017-02-10 DIAGNOSIS — R002 Palpitations: Secondary | ICD-10-CM | POA: Diagnosis not present

## 2017-02-18 ENCOUNTER — Telehealth (HOSPITAL_COMMUNITY): Payer: Self-pay

## 2017-02-18 NOTE — Telephone Encounter (Signed)
Encounter complete. 

## 2017-02-20 ENCOUNTER — Ambulatory Visit (HOSPITAL_COMMUNITY)
Admission: RE | Admit: 2017-02-20 | Discharge: 2017-02-20 | Disposition: A | Discharge status: Home / Self Care | Payer: PRIVATE HEALTH INSURANCE | Source: Ambulatory Visit | Attending: Cardiovascular Disease | Admitting: Cardiovascular Disease

## 2017-02-20 DIAGNOSIS — R079 Chest pain, unspecified: Secondary | ICD-10-CM | POA: Diagnosis not present

## 2017-02-20 MED ORDER — TECHNETIUM TC 99M TETROFOSMIN IV KIT
28.7000 | PACK | Freq: Once | INTRAVENOUS | Status: AC | PRN
  Administered 2017-02-20: 28.7 via INTRAVENOUS
  Filled 2017-02-20: qty 29

## 2017-02-20 MED ORDER — AMINOPHYLLINE 25 MG/ML IV SOLN
100.0000 mg | Freq: Once | INTRAVENOUS | Status: AC
  Administered 2017-02-20: 100 mg via INTRAVENOUS

## 2017-02-20 MED ORDER — REGADENOSON 0.4 MG/5ML IV SOLN
0.4000 mg | Freq: Once | INTRAVENOUS | Status: AC
  Administered 2017-02-20: 0.4 mg via INTRAVENOUS

## 2017-02-21 ENCOUNTER — Ambulatory Visit (HOSPITAL_COMMUNITY)
Admission: RE | Admit: 2017-02-21 | Discharge: 2017-02-21 | Disposition: A | Discharge status: Home / Self Care | Payer: PRIVATE HEALTH INSURANCE | Source: Ambulatory Visit | Attending: Cardiology | Admitting: Cardiology

## 2017-02-21 LAB — MYOCARDIAL PERFUSION IMAGING
CHL CUP NUCLEAR SDS: 4
CHL CUP NUCLEAR SRS: 0
CHL CUP NUCLEAR SSS: 4
CSEPPHR: 126 {beats}/min
LV sys vol: 30 mL
LVDIAVOL: 76 mL (ref 46–106)
Rest HR: 80 {beats}/min
TID: 1.11

## 2017-02-21 MED ORDER — TECHNETIUM TC 99M TETROFOSMIN IV KIT
31.4000 | PACK | Freq: Once | INTRAVENOUS | Status: AC | PRN
  Administered 2017-02-21: 31.4 via INTRAVENOUS

## 2017-02-24 ENCOUNTER — Encounter: Payer: Self-pay | Admitting: Physician Assistant

## 2017-02-24 ENCOUNTER — Ambulatory Visit (INDEPENDENT_AMBULATORY_CARE_PROVIDER_SITE_OTHER): Payer: PRIVATE HEALTH INSURANCE | Admitting: Physician Assistant

## 2017-02-24 VITALS — BP 128/80 | HR 79 | Ht 61.0 in | Wt 238.4 lb

## 2017-02-24 DIAGNOSIS — R0781 Pleurodynia: Secondary | ICD-10-CM | POA: Diagnosis not present

## 2017-02-24 DIAGNOSIS — R002 Palpitations: Secondary | ICD-10-CM | POA: Diagnosis not present

## 2017-02-24 DIAGNOSIS — R600 Localized edema: Secondary | ICD-10-CM

## 2017-02-24 DIAGNOSIS — E785 Hyperlipidemia, unspecified: Secondary | ICD-10-CM | POA: Diagnosis not present

## 2017-02-24 DIAGNOSIS — I10 Essential (primary) hypertension: Secondary | ICD-10-CM | POA: Diagnosis not present
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
## 2017-02-24 NOTE — Patient Instructions (Signed)
Medication Instructions:  Continue current medications  If you need a refill on your cardiac medications before your next appointment, please call your pharmacy.  Labwork: None Ordered  Testing/Procedures: None Ordered  Special Instructions: Trying walking periodically to keep fluid out of legs Keep legs elevated 2000 mg Sodium a day  Follow-Up: Your physician wants you to follow-up in: 1 Year with Dr Oval Linsey. You should receive a reminder letter in the mail two months in advance. If you do not receive a letter, please call our office [phone].    Thank you for choosing [facility] at [facility]!!

## 2017-02-27 ENCOUNTER — Ambulatory Visit
Admission: RE | Admit: 2017-02-27 | Discharge: 2017-02-27 | Disposition: A | Discharge status: Home / Self Care | Payer: PRIVATE HEALTH INSURANCE | Source: Ambulatory Visit | Attending: Internal Medicine | Admitting: Internal Medicine

## 2017-02-27 ENCOUNTER — Other Ambulatory Visit: Payer: Self-pay | Admitting: Internal Medicine

## 2017-02-27 DIAGNOSIS — R109 Unspecified abdominal pain: Secondary | ICD-10-CM

## 2017-03-26 ENCOUNTER — Ambulatory Visit: Payer: PRIVATE HEALTH INSURANCE | Admitting: Neurology

## 2017-03-26 ENCOUNTER — Other Ambulatory Visit: Payer: Self-pay | Admitting: Internal Medicine

## 2017-03-26 ENCOUNTER — Encounter: Payer: Self-pay | Admitting: Neurology

## 2017-03-26 VITALS — BP 142/92 | Ht 61.0 in | Wt 232.0 lb

## 2017-03-26 DIAGNOSIS — R351 Nocturia: Secondary | ICD-10-CM

## 2017-03-26 DIAGNOSIS — N644 Mastodynia: Secondary | ICD-10-CM

## 2017-03-26 DIAGNOSIS — R0683 Snoring: Secondary | ICD-10-CM | POA: Diagnosis not present

## 2017-03-26 DIAGNOSIS — R51 Headache: Secondary | ICD-10-CM | POA: Diagnosis not present

## 2017-03-26 DIAGNOSIS — G4489 Other headache syndrome: Secondary | ICD-10-CM | POA: Diagnosis not present

## 2017-03-26 DIAGNOSIS — Z8669 Personal history of other diseases of the nervous system and sense organs: Secondary | ICD-10-CM | POA: Diagnosis not present

## 2017-03-26 DIAGNOSIS — Z6841 Body Mass Index (BMI) 40.0 and over, adult: Secondary | ICD-10-CM

## 2017-03-26 DIAGNOSIS — R519 Headache, unspecified: Secondary | ICD-10-CM

## 2017-03-26 NOTE — Patient Instructions (Addendum)
Thank you for choosing [facility] for your sleep related care! It was nice to meet you today! I appreciate that you entrust me with your sleep related healthcare concerns. I hope, I was able to address at least some of your concerns today, and that I can help you feel reassured and also get better.    Here is what we discussed today and what we came up with as our plan for you:   For your history of migraine and recurrent headaches which have increased since October 2018, we will order a brain MRI with and without contrast, you can have this done at [place].   Based on your symptoms and your exam I believe you are at risk for obstructive sleep apnea or OSA, and I think we should proceed with a sleep study to determine whether you do or do not have OSA and how severe it is. If you have more than mild OSA, I want you to consider treatment with CPAP. Please remember, the risks and ramifications of moderate to severe obstructive sleep apnea or OSA are: Cardiovascular disease, including congestive heart failure, stroke, difficult to control hypertension, arrhythmias, and even type 2 diabetes has been linked to untreated OSA. Sleep apnea causes disruption of sleep and sleep deprivation in most cases, which, in turn, can cause recurrent headaches, problems with memory, mood, concentration, focus, and vigilance. Most people with untreated sleep apnea report excessive daytime sleepiness, which can affect their ability to drive. Please do not drive if you feel sleepy.   I will likely see you back after your sleep study to go over the test results and where to go from there. We will call you after your sleep study to advise about the results (most likely, you will hear from [place], my nurse) and to set up an appointment at the time, as necessary.    Our sleep lab administrative assistant will call you to schedule your sleep study. If you don't hear back from her by about 2 weeks from  now, please feel free to call her at [phone]. You can leave a message with your phone number and concerns, if you get the voicemail box. She will call back as soon as possible.
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
## 2017-03-31 ENCOUNTER — Ambulatory Visit
Admission: RE | Admit: 2017-03-31 | Discharge: 2017-03-31 | Disposition: A | Discharge status: Home / Self Care | Payer: PRIVATE HEALTH INSURANCE | Source: Ambulatory Visit | Attending: Internal Medicine | Admitting: Internal Medicine

## 2017-03-31 DIAGNOSIS — N644 Mastodynia: Secondary | ICD-10-CM

## 2017-04-03 ENCOUNTER — Other Ambulatory Visit: Payer: Self-pay | Admitting: Internal Medicine

## 2017-04-03 DIAGNOSIS — N644 Mastodynia: Secondary | ICD-10-CM

## 2017-04-06 ENCOUNTER — Ambulatory Visit
Admission: RE | Admit: 2017-04-06 | Discharge: 2017-04-06 | Disposition: A | Discharge status: Home / Self Care | Payer: PRIVATE HEALTH INSURANCE | Source: Ambulatory Visit | Attending: Neurology | Admitting: Neurology

## 2017-04-06 DIAGNOSIS — G4489 Other headache syndrome: Secondary | ICD-10-CM

## 2017-04-06 MED ORDER — GADOBENATE DIMEGLUMINE 529 MG/ML IV SOLN
20.0000 mL | Freq: Once | INTRAVENOUS | Status: AC | PRN
  Administered 2017-04-06: 20 mL via INTRAVENOUS

## 2017-04-07 ENCOUNTER — Telehealth: Payer: Self-pay | Admitting: *Deleted

## 2017-04-07 NOTE — Telephone Encounter (Signed)
-----   Message from Britt Bottom, MD sent at 04/07/2017  3:08 PM EDT ----- Please let her know that the MRI of the brain just shows some mild age-related change.   No significant change compared to her previous MRI.

## 2017-04-07 NOTE — Telephone Encounter (Signed)
Called and LVM for pt that MRI brain showed mild age-related changes. No significant change compared to previous MRI per Dr. Rexene Alberts. Gave [facility] phone number if she has further questions/concerns.

## 2017-04-09 ENCOUNTER — Ambulatory Visit (INDEPENDENT_AMBULATORY_CARE_PROVIDER_SITE_OTHER): Payer: PRIVATE HEALTH INSURANCE

## 2017-04-09 ENCOUNTER — Encounter (INDEPENDENT_AMBULATORY_CARE_PROVIDER_SITE_OTHER): Payer: Self-pay | Admitting: Orthopedic Surgery

## 2017-04-09 ENCOUNTER — Ambulatory Visit
Admission: RE | Admit: 2017-04-09 | Discharge: 2017-04-09 | Disposition: A | Discharge status: Home / Self Care | Payer: PRIVATE HEALTH INSURANCE | Source: Ambulatory Visit | Attending: Orthopedic Surgery | Admitting: Orthopedic Surgery

## 2017-04-09 ENCOUNTER — Ambulatory Visit (INDEPENDENT_AMBULATORY_CARE_PROVIDER_SITE_OTHER): Payer: Self-pay | Admitting: Orthopedic Surgery

## 2017-04-09 ENCOUNTER — Ambulatory Visit (INDEPENDENT_AMBULATORY_CARE_PROVIDER_SITE_OTHER): Payer: PRIVATE HEALTH INSURANCE | Admitting: Orthopedic Surgery

## 2017-04-09 VITALS — BP 134/90 | HR 88 | Resp 16 | Ht 61.0 in | Wt 230.0 lb

## 2017-04-09 DIAGNOSIS — M7989 Other specified soft tissue disorders: Secondary | ICD-10-CM | POA: Diagnosis not present

## 2017-04-09 DIAGNOSIS — M79661 Pain in right lower leg: Secondary | ICD-10-CM

## 2017-04-09 DIAGNOSIS — R202 Paresthesia of skin: Secondary | ICD-10-CM

## 2017-04-09 DIAGNOSIS — M25561 Pain in right knee: Secondary | ICD-10-CM

## 2017-04-09 DIAGNOSIS — R200 Anesthesia of skin: Secondary | ICD-10-CM | POA: Diagnosis not present

## 2017-04-09 MED ORDER — HYDROMORPHONE HCL 2 MG PO TABS: 2.0000 mg | ORAL_TABLET | ORAL | 0 refills | Status: DC | PRN
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
## 2017-04-10 ENCOUNTER — Ambulatory Visit (INDEPENDENT_AMBULATORY_CARE_PROVIDER_SITE_OTHER): Payer: PRIVATE HEALTH INSURANCE | Admitting: Orthopaedic Surgery

## 2017-04-10 ENCOUNTER — Encounter (INDEPENDENT_AMBULATORY_CARE_PROVIDER_SITE_OTHER): Payer: Self-pay | Admitting: Orthopaedic Surgery

## 2017-04-10 ENCOUNTER — Other Ambulatory Visit (INDEPENDENT_AMBULATORY_CARE_PROVIDER_SITE_OTHER): Payer: Self-pay | Admitting: Orthopedic Surgery

## 2017-04-10 VITALS — BP 131/85 | HR 84 | Resp 18 | Ht 61.0 in | Wt 230.0 lb

## 2017-04-10 DIAGNOSIS — D499 Neoplasm of unspecified behavior of unspecified site: Secondary | ICD-10-CM

## 2017-04-10 DIAGNOSIS — M1711 Unilateral primary osteoarthritis, right knee: Secondary | ICD-10-CM | POA: Diagnosis not present
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
## 2017-04-11 ENCOUNTER — Ambulatory Visit
Admission: RE | Admit: 2017-04-11 | Discharge: 2017-04-11 | Disposition: A | Discharge status: Home / Self Care | Payer: PRIVATE HEALTH INSURANCE | Source: Ambulatory Visit | Attending: Orthopaedic Surgery | Admitting: Orthopaedic Surgery

## 2017-04-11 DIAGNOSIS — D499 Neoplasm of unspecified behavior of unspecified site: Secondary | ICD-10-CM

## 2017-04-11 MED ORDER — GADOBENATE DIMEGLUMINE 529 MG/ML IV SOLN
20.0000 mL | Freq: Once | INTRAVENOUS | Status: AC | PRN
  Administered 2017-04-11: 20 mL via INTRAVENOUS

## 2017-04-12 ENCOUNTER — Other Ambulatory Visit: Payer: PRIVATE HEALTH INSURANCE

## 2017-04-14 ENCOUNTER — Ambulatory Visit
Admission: RE | Admit: 2017-04-14 | Discharge: 2017-04-14 | Disposition: A | Discharge status: Home / Self Care | Payer: PRIVATE HEALTH INSURANCE | Source: Ambulatory Visit | Attending: Internal Medicine | Admitting: Internal Medicine

## 2017-04-14 ENCOUNTER — Other Ambulatory Visit (INDEPENDENT_AMBULATORY_CARE_PROVIDER_SITE_OTHER): Payer: Self-pay | Admitting: *Deleted

## 2017-04-14 ENCOUNTER — Telehealth (INDEPENDENT_AMBULATORY_CARE_PROVIDER_SITE_OTHER): Payer: Self-pay | Admitting: Orthopaedic Surgery

## 2017-04-14 DIAGNOSIS — N644 Mastodynia: Secondary | ICD-10-CM

## 2017-04-14 MED ORDER — GADOBENATE DIMEGLUMINE 529 MG/ML IV SOLN
20.0000 mL | Freq: Once | INTRAVENOUS | Status: AC | PRN
  Administered 2017-04-14: 20 mL via INTRAVENOUS

## 2017-04-14 MED ORDER — METHYLPREDNISOLONE 4 MG PO TBPK: ORAL_TABLET | ORAL | 0 refills | Status: DC

## 2017-04-14 NOTE — Telephone Encounter (Signed)
Patient calling in reference to MRI with contrast. Per patient, she knows results. Would like to know what plan is from here. Please call to inform.

## 2017-04-14 NOTE — Telephone Encounter (Signed)
called

## 2017-04-14 NOTE — Telephone Encounter (Signed)
Please advise 

## 2017-04-16 ENCOUNTER — Other Ambulatory Visit: Payer: Self-pay | Admitting: Internal Medicine

## 2017-04-16 DIAGNOSIS — R928 Other abnormal and inconclusive findings on diagnostic imaging of breast: Secondary | ICD-10-CM

## 2017-04-23 ENCOUNTER — Other Ambulatory Visit: Payer: Self-pay | Admitting: Internal Medicine

## 2017-04-23 ENCOUNTER — Ambulatory Visit
Admission: RE | Admit: 2017-04-23 | Discharge: 2017-04-23 | Disposition: A | Discharge status: Home / Self Care | Payer: PRIVATE HEALTH INSURANCE | Source: Ambulatory Visit | Attending: Internal Medicine | Admitting: Internal Medicine

## 2017-04-23 DIAGNOSIS — R928 Other abnormal and inconclusive findings on diagnostic imaging of breast: Secondary | ICD-10-CM

## 2017-04-23 MED ORDER — GADOBENATE DIMEGLUMINE 529 MG/ML IV SOLN
20.0000 mL | Freq: Once | INTRAVENOUS | Status: AC | PRN
  Administered 2017-04-23: 20 mL via INTRAVENOUS

## 2017-04-29 ENCOUNTER — Encounter: Payer: Self-pay | Admitting: *Deleted

## 2017-05-09 ENCOUNTER — Ambulatory Visit (INDEPENDENT_AMBULATORY_CARE_PROVIDER_SITE_OTHER): Payer: PRIVATE HEALTH INSURANCE | Admitting: Neurology

## 2017-05-09 DIAGNOSIS — R519 Headache, unspecified: Secondary | ICD-10-CM

## 2017-05-09 DIAGNOSIS — R0683 Snoring: Secondary | ICD-10-CM

## 2017-05-09 DIAGNOSIS — R51 Headache: Secondary | ICD-10-CM

## 2017-05-09 DIAGNOSIS — G4733 Obstructive sleep apnea (adult) (pediatric): Secondary | ICD-10-CM | POA: Diagnosis not present

## 2017-05-09 DIAGNOSIS — Z6841 Body Mass Index (BMI) 40.0 and over, adult: Secondary | ICD-10-CM

## 2017-05-09 DIAGNOSIS — R351 Nocturia: Secondary | ICD-10-CM

## 2017-05-09 DIAGNOSIS — G4720 Circadian rhythm sleep disorder, unspecified type: Secondary | ICD-10-CM

## 2017-05-09 DIAGNOSIS — Z8669 Personal history of other diseases of the nervous system and sense organs: Secondary | ICD-10-CM

## 2017-05-19 ENCOUNTER — Telehealth: Payer: Self-pay

## 2017-05-19 NOTE — Telephone Encounter (Signed)
-----   Message from Star Age, MD sent at 05/19/2017  4:26 PM EDT ----- Patient referred by Dr. Philip Aspen, seen by me on 03/26/17, diagnostic PSG on 05/09/17.   Please call and notify the patient that the recent sleep study showed moderate to severe obstructive sleep apnea, with a total AHI of 22.6/hour, REM AHI of 30.8/hour, supine AHI of 28.2/hour and O2 nadir of 71%. I recommend treatment for this in the form of CPAP. This will require a repeat sleep study for proper titration and mask fitting and correct monitoring of the oxygen saturations. Please explain to patient. I have placed an order in the chart. Thanks.  Star Age, MD, PhD [facility])

## 2017-05-19 NOTE — Telephone Encounter (Signed)
I called pt. I advised pt that Dr. Rexene Alberts reviewed their sleep study results and found that does have moderate to severe osa with an O2 nadir of 71% and recommends that pt be treated with a cpap. Dr. Rexene Alberts recommends that pt return for a repeat sleep study in order to properly titrate the cpap and ensure a good mask fit. Pt is agreeable to returning for a titration study. I advised pt that our sleep lab will file with pt's insurance and call pt to schedule the sleep study when we hear back from the pt's insurance regarding coverage of this sleep study. Pt verbalized understanding of results. Pt had no questions at this time but was encouraged to call back if questions arise.

## 2017-05-19 NOTE — Procedures (Signed)
PATIENT'S NAME:  Vanessa Berg, Vanessa Berg DOB:      March 28, 1960      MR#:    [id number]     DATE OF RECORDING: 05/09/2017 REFERRING M.D.:  Leanna Battles MD Study Performed:   Baseline Polysomnogram HISTORY: 57 year old woman with a history of reflux disease, history of Barrett's esophagus, asthma, hypertension, hyperlipidemia, palpitations, irritable bowel syndrome, allergic rhinitis, vitamin D deficiency, and morbid obesity, who reports non-restorative sleep, sleep disruption, recurrent headaches, and daytime tiredness. The patient endorsed the Epworth Sleepiness Scale at 3/24 points. The patient's weight 232 pounds with a height of 61 (inches), resulting in a BMI of 43.7 kg/m2. The patient's neck circumference measured 15.5 inches.  CURRENT MEDICATIONS: Proventil, Lotronex, Xanax, Vitamin C, Dymista, Valisone, Temovate, Flexeril, Dexilant, Voltaren, Bendryl, Vitamin D, Neurontin, Hyzaar, Toprol, Dulera, Zofran, Crestor, Synthroid, Topamax, Ultram.   PROCEDURE:  This is a multichannel digital polysomnogram utilizing the Somnostar 11.2 system.  Electrodes and sensors were applied and monitored per AASM Specifications.   EEG, EOG, Chin and Limb EMG, were sampled at 200 Hz.  ECG, Snore and Nasal Pressure, Thermal Airflow, Respiratory Effort, CPAP Flow and Pressure, Oximetry was sampled at 50 Hz. Digital video and audio were recorded.      BASELINE STUDY  Lights Out was at 21:29 and Lights On at 04:58.  Total recording time (TRT) was 449 minutes, with a total sleep time (TST) of  409 minutes.   The patient's sleep latency was 22.5 minutes.  REM latency was 78 minutes.  The sleep efficiency was 91.1 %.     SLEEP ARCHITECTURE: WASO (Wake after sleep onset) was 22.5 minutes with mild sleep fragmentation noted.  There were 13 minutes in Stage N1, 154 minutes Stage N2, 148.5 minutes Stage N3 and 93.5 minutes in Stage REM.  The percentage of Stage N1 was 3.2%, Stage N2 was 37.7%, Stage N3 was 36.3%,  which is increased, and Stage R (REM sleep) was 22.9%, which is normal. The arousals were noted as: 23 were spontaneous, 2 were associated with PLMs, 22 were associated with respiratory events.  Audio and video analysis did not show any abnormal or unusual movements, behaviors, phonations or vocalizations. The patient took no bathroom breaks. Mild to moderate snoring was noted. The EKG was in keeping with normal sinus rhythm (NSR).  RESPIRATORY ANALYSIS:  There were a total of 154 respiratory events:  5 obstructive apneas, 3 central apneas and 0 mixed apneas with a total of 8 apneas and an apnea index (AI) of 1.2 /hour. There were 146 hypopneas with a hypopnea index of 21.4 /hour. The patient also had 0 respiratory event related arousals (RERAs).      The total APNEA/HYPOPNEA INDEX (AHI) was 22.6/hour and the total RESPIRATORY DISTURBANCE INDEX was 22.6 /hour.  48 events occurred in REM sleep and 201 events in NREM. The REM AHI was 30.8 /hour, versus a non-REM AHI of 20.2. The patient spent 174.5 minutes of total sleep time in the supine position and 235 minutes in non-supine.. The supine AHI was 28.2 versus a non-supine AHI of 18.5.  OXYGEN SATURATION & C02:  The Wake baseline 02 saturation was 96%, with the lowest being 71%. Time spent below 89% saturation equaled 11 minutes.  PERIODIC LIMB MOVEMENTS: The patient had a total of 17 Periodic Limb Movements.  The Periodic Limb Movement (PLM) index was 2.5 and the PLM Arousal index was .3/hour.  IMPRESSION:  1. Obstructive Sleep Apnea (OSA) 2. Dysfunctions associated with sleep stages or arousal from  sleep  RECOMMENDATIONS:  1. This study demonstrates moderate to severe obstructive sleep apnea, with a total AHI of 22.6/hour, REM AHI of 30.8/hour, supine AHI of 28.2/hour and O2 nadir of 71%. Treatment with positive airway pressure in the form of CPAP is recommended. This will require a full night titration study to optimize therapy. Other treatment  options may include avoidance of supine sleep position along with weight loss, upper airway or jaw surgery in selected patients or the use of an oral appliance in certain patients. ENT evaluation and/or consultation with a maxillofacial surgeon or dentist may be feasible in some instances.    2. Please note that untreated obstructive sleep apnea carries additional perioperative morbidity. Patients with significant obstructive sleep apnea should receive perioperative PAP therapy and the surgeons and particularly the anesthesiologist should be informed of the diagnosis and the severity of the sleep disordered breathing. 3. This study shows sleep fragmentation and abnormal sleep stage percentages; these are nonspecific findings and per se do not signify an intrinsic sleep disorder or a cause for the patient's sleep-related symptoms. Causes include (but are not limited to) the first night effect of the sleep study, circadian rhythm disturbances, medication effect or an underlying mood disorder or medical problem.  4. The patient should be cautioned not to drive, work at heights, or operate dangerous or heavy equipment when tired or sleepy. Review and reiteration of good sleep hygiene measures should be pursued with any patient. 5. The patient will be seen in follow-up by Dr. Rexene Alberts at [facility] for discussion of the test results and further management strategies. The referring provider will be notified of the test results.  I certify that I have reviewed the entire raw data recording prior to the issuance of this report in accordance with the Standards of Accreditation of the American Academy of Sleep Medicine (AASM)   Star Age, MD, PhD Diplomat, American Board of Psychiatry and Neurology (Neurology and Sleep Medicine)

## 2017-05-19 NOTE — Addendum Note (Signed)
Addended by: Star Age on: 05/19/2017 04:26 PM   Modules accepted: Orders

## 2017-05-19 NOTE — Progress Notes (Signed)
Patient referred by Dr. Philip Aspen, seen by me on 03/26/17, diagnostic PSG on 05/09/17.   Please call and notify the patient that the recent sleep study showed moderate to severe obstructive sleep apnea, with a total AHI of 22.6/hour, REM AHI of 30.8/hour, supine AHI of 28.2/hour and O2 nadir of 71%. I recommend treatment for this in the form of CPAP. This will require a repeat sleep study for proper titration and mask fitting and correct monitoring of the oxygen saturations. Please explain to patient. I have placed an order in the chart. Thanks.  Star Age, MD, PhD [facility])

## 2017-05-19 NOTE — Telephone Encounter (Signed)
I called pt to discuss. No answer, left a message asking her to call me back. 

## 2017-06-20 ENCOUNTER — Ambulatory Visit (INDEPENDENT_AMBULATORY_CARE_PROVIDER_SITE_OTHER): Payer: PRIVATE HEALTH INSURANCE | Admitting: Neurology

## 2017-06-20 DIAGNOSIS — R51 Headache: Secondary | ICD-10-CM

## 2017-06-20 DIAGNOSIS — G4720 Circadian rhythm sleep disorder, unspecified type: Secondary | ICD-10-CM

## 2017-06-20 DIAGNOSIS — G4733 Obstructive sleep apnea (adult) (pediatric): Secondary | ICD-10-CM

## 2017-06-20 DIAGNOSIS — R519 Headache, unspecified: Secondary | ICD-10-CM

## 2017-06-20 DIAGNOSIS — Z6841 Body Mass Index (BMI) 40.0 and over, adult: Secondary | ICD-10-CM

## 2017-06-20 DIAGNOSIS — Z8669 Personal history of other diseases of the nervous system and sense organs: Secondary | ICD-10-CM

## 2017-06-20 DIAGNOSIS — R351 Nocturia: Secondary | ICD-10-CM

## 2017-06-24 ENCOUNTER — Telehealth: Payer: Self-pay

## 2017-06-24 NOTE — Telephone Encounter (Signed)
I called pt. I advised pt that Dr. Rexene Alberts reviewed their sleep study results and found that pt did well during the latest sleep study with the cpap. Dr. Rexene Alberts recommends that pt start a cpap at home. I reviewed PAP compliance expectations with the pt. Pt is agreeable to starting a CPAP. I advised pt that an order will be sent to a DME, [facility], and [facility] will call the pt within about one week after they file with the pt's insurance. [facility] will show the pt how to use the machine, fit for masks, and troubleshoot the CPAP if needed. A follow up appt was made for insurance purposes with Dr. Rexene Alberts on 09/22/17 at 8:30am. Pt verbalized understanding to arrive 15 minutes early and bring their CPAP. A letter with all of this information in it will be mailed to the pt as a reminder. I verified with the pt that the address we have on file is correct. Pt verbalized understanding of results. Pt had no questions at this time but was encouraged to call back if questions arise.

## 2017-06-24 NOTE — Telephone Encounter (Signed)
-----   Message from Star Age, MD sent at 06/24/2017  8:31 AM EDT ----- Patient referred by Dr. Philip Aspen, seen by me on 03/26/17, diagnostic PSG on 05/09/17. Patient had a CPAP titration study on 06/20/17.  Please call and inform patient that I have entered an order for treatment with positive airway pressure (PAP) treatment for obstructive sleep apnea (OSA). She did well during the latest sleep study with CPAP. We will, therefore, arrange for a machine for home use through a DME (durable medical equipment) company of Her choice; and I will see the patient back in follow-up in about 10 weeks. Please also explain to the patient that I will be looking out for compliance data, which can be downloaded from the machine (stored on an SD card, that is inserted in the machine) or via remote access through a modem, that is built into the machine. At the time of the followup appointment we will discuss sleep study results and how it is going with PAP treatment at home. Please advise patient to bring Her machine at the time of the first FU visit, even though this is cumbersome. Bringing the machine for every visit after that will likely not be needed, but often helps for the first visit to troubleshoot if needed. Please re-enforce the importance of compliance with treatment and the need for [place] to monitor compliance data - often an insurance requirement and actually good feedback for the patient as far as how they are doing.  Also remind patient, that any interim PAP machine or mask issues should be first addressed with the DME company, as they can often help better with technical and mask fit issues. Please ask if patient has a preference regarding DME company.  Please also make sure, the patient has a follow-up appointment with me in about 10 weeks from the setup date, thanks. May see one of our nurse practitioners if needed for proper timing of the FU appointment.  Please fax or rout report to the referring provider.  Thanks,   Star Age, MD, PhD [facility])

## 2017-06-24 NOTE — Telephone Encounter (Signed)
I called pt to discuss, no answer, left a message asking her to call me back. 

## 2017-06-24 NOTE — Addendum Note (Signed)
Addended by: Star Age on: 06/24/2017 08:31 AM   Modules accepted: Orders

## 2017-06-24 NOTE — Progress Notes (Signed)
Patient referred by Dr. Philip Aspen, seen by me on 03/26/17, diagnostic PSG on 05/09/17. Patient had a CPAP titration study on 06/20/17.  Please call and inform patient that I have entered an order for treatment with positive airway pressure (PAP) treatment for obstructive sleep apnea (OSA). She did well during the latest sleep study with CPAP. We will, therefore, arrange for a machine for home use through a DME (durable medical equipment) company of Her choice; and I will see the patient back in follow-up in about 10 weeks. Please also explain to the patient that I will be looking out for compliance data, which can be downloaded from the machine (stored on an SD card, that is inserted in the machine) or via remote access through a modem, that is built into the machine. At the time of the followup appointment we will discuss sleep study results and how it is going with PAP treatment at home. Please advise patient to bring Her machine at the time of the first FU visit, even though this is cumbersome. Bringing the machine for every visit after that will likely not be needed, but often helps for the first visit to troubleshoot if needed. Please re-enforce the importance of compliance with treatment and the need for [place] to monitor compliance data - often an insurance requirement and actually good feedback for the patient as far as how they are doing.  Also remind patient, that any interim PAP machine or mask issues should be first addressed with the DME company, as they can often help better with technical and mask fit issues. Please ask if patient has a preference regarding DME company.  Please also make sure, the patient has a follow-up appointment with me in about 10 weeks from the setup date, thanks. May see one of our nurse practitioners if needed for proper timing of the FU appointment.  Please fax or rout report to the referring provider. Thanks,   Star Age, MD, PhD [facility])

## 2017-06-24 NOTE — Procedures (Signed)
PATIENT'S NAME:  Vanessa Berg, Vanessa Berg DOB:      Mar 21, 1960      MR#:    [id number]     DATE OF RECORDING: 06/20/2017 REFERRING M.D.:  Leanna Battles MD Study Performed:   CPAP  Titration HISTORY: 57 year old woman with a history of reflux disease, history of Barrett's esophagus, asthma, hypertension, hyperlipidemia, palpitations, irritable bowel syndrome, allergic rhinitis, vitamin D deficiency, and morbid obesity, who presents for a CPAP titration study. Her baseline PSG on 05/09/17 showed moderate to severe obstructive sleep apnea, with a total AHI of 22.6/hour, REM AHI of 30.8/hour, supine AHI of 28.2/hour and O2 nadir of 71%. The patient endorsed the Epworth Sleepiness Scale at 3/24. BMI of 43.7 kg/m2. The patient's neck circumference measured 15 inches.  CURRENT MEDICATIONS: Proventil, Lotronex, Xanax, Vitamin C, Dymista, Valisone, Temovate, Flexeril, Dexilant, Voltaren, Bendryl, Vitamin D, Neurontin, Hyzaar, Toprol, Dulera, Zofran, Crestor, Synthroid, Topamax, Ultram.   PROCEDURE:  This is a multichannel digital polysomnogram utilizing the SomnoStar 11.2 system.  Electrodes and sensors were applied and monitored per AASM Specifications.   EEG, EOG, Chin and Limb EMG, were sampled at 200 Hz.  ECG, Snore and Nasal Pressure, Thermal Airflow, Respiratory Effort, CPAP Flow and Pressure, Oximetry was sampled at 50 Hz. Digital video and audio were recorded.      The patient was fitted with a small Simplus FFM. CPAP was initiated at 5 cmH20 with heated humidity per AASM split night standards and pressure was advanced to 14 cmH20 because of hypopneas, apneas and desaturations.  At a PAP pressure of 14 cmH20, there was a reduction of the AHI to 2.9/hour with supine REM sleep achieved and O2 nadir of 93%.   Lights Out was at 22:37 and Lights On at 04:55. Total recording time (TRT) was 378.5 minutes, with a total sleep time (TST) of 357 minutes. The patient's sleep latency was 9.5 minutes. REM  latency was 68.5 minutes.  The sleep efficiency was 94.3 %.    SLEEP ARCHITECTURE: WASO (Wake after sleep onset) was 11 minutes.  There were 10.5 minutes in Stage N1, 113.5 minutes Stage N2, 102.5 minutes Stage N3 and 130.5 minutes in Stage REM.  The percentage of Stage N1 was 2.9%, Stage N2 was 31.8%, Stage N3 was 28.7% and Stage R (REM sleep) was 36.6%. The arousals were noted as: 13 were spontaneous, 14 were associated with PLMs, 6 were associated with respiratory events.   RESPIRATORY ANALYSIS: There was a total of 36 respiratory events: 5 obstructive apneas, 8 central apneas and 0 mixed apneas with a total of 13 apneas and an apnea index (AI) of 2.2 /hour. There were 23 hypopneas with a hypopnea index of 3.9/hour. The patient also had 0 respiratory event related arousals (RERAs).      The total APNEA/HYPOPNEA INDEX  (AHI) was 6.1/hour and the total RESPIRATORY DISTURBANCE INDEX was 6.1 0./hour  13 events occurred in REM sleep and 23 events in NREM. The REM AHI was 6. /hour versus a non-REM AHI of 6.1 0./hour.  The patient spent 258.5 minutes of total sleep time in the supine position and 99 minutes in non-supine. The supine AHI was 7.0, versus a non-supine AHI of 3.6.   OXYGEN SATURATION & C02:  The baseline 02 saturation was 98%, with the lowest being 85%. Time spent below 89% saturation equaled 5 minutes.  PERIODIC LIMB MOVEMENTS:  The patient had a total of 49 Periodic Limb Movements. The Periodic Limb Movement (PLM) index was 8.2 and the PLM Arousal  index was 2.4 /hour.  Audio and video analysis did not show any abnormal or unusual movements, behaviors, phonations or vocalizations. The patient took no bathroom breaks. The EKG was in keeping with normal sinus rhythm (NSR).  IMPRESSION:   1. Obstructive Sleep Apnea (OSA) 2. Dysfunctions associated with sleep stages or arousal from sleep   RECOMMENDATIONS:   1. This study demonstrates near-complete resolution of the patient's obstructive  sleep apnea with CPAP therapy. I will, therefore, start the patient on home CPAP treatment at a pressure of 14 cm via small FFM with heated humidity. The patient should be reminded to be fully compliant with PAP therapy to improve sleep related symptoms and decrease long term cardiovascular risks. The patient should be reminded, that it may take up to 3 months to get fully used to using PAP with all planned sleep. The earlier full compliance is achieved, the better long term compliance tends to be. Please note that untreated obstructive sleep apnea carries additional perioperative morbidity. Patients with significant obstructive sleep apnea should receive perioperative PAP therapy and the surgeons and particularly the anesthesiologist should be informed of the diagnosis and the severity of the sleep disordered breathing. 2. This study shows sleep fragmentation and abnormal sleep stage percentages; these are nonspecific findings and per se do not signify an intrinsic sleep disorder or a cause for the patient's sleep-related symptoms. Causes include (but are not limited to) the first night effect of the sleep study, circadian rhythm disturbances, medication effect or an underlying mood disorder or medical problem.  3. The patient should be cautioned not to drive, work at heights, or operate dangerous or heavy equipment when tired or sleepy. Review and reiteration of good sleep hygiene measures should be pursued with any patient. 4. The patient will be seen in follow-up by Dr. Rexene Alberts at [facility] for discussion of the test results and further management strategies. The referring provider will be notified of the test results.   I certify that I have reviewed the entire raw data recording prior to the issuance of this report in accordance with the Standards of Accreditation of the American Academy of Sleep Medicine (AASM)   Star Age, MD, PhD Diplomat, American Board of Psychiatry and Neurology (Neurology and Sleep  Medicine)

## 2017-06-30 ENCOUNTER — Ambulatory Visit: Payer: PRIVATE HEALTH INSURANCE | Admitting: Allergy and Immunology

## 2017-06-30 ENCOUNTER — Encounter: Payer: Self-pay | Admitting: Allergy and Immunology

## 2017-06-30 VITALS — BP 132/90 | HR 85 | Temp 98.0°F | Resp 16 | Ht 61.58 in | Wt 224.0 lb

## 2017-06-30 DIAGNOSIS — J4540 Moderate persistent asthma, uncomplicated: Secondary | ICD-10-CM | POA: Diagnosis not present

## 2017-06-30 DIAGNOSIS — L500 Allergic urticaria: Secondary | ICD-10-CM | POA: Diagnosis not present

## 2017-06-30 DIAGNOSIS — J3089 Other allergic rhinitis: Secondary | ICD-10-CM | POA: Diagnosis not present

## 2017-06-30 MED ORDER — BUDESONIDE-FORMOTEROL FUMARATE 160-4.5 MCG/ACT IN AERO: 2.0000 | INHALATION_SPRAY | Freq: Two times a day (BID) | RESPIRATORY_TRACT | 5 refills | Status: DC

## 2017-06-30 MED ORDER — AZELASTINE HCL 0.1 % NA SOLN: NASAL | 5 refills | Status: DC

## 2017-06-30 MED ORDER — CARBINOXAMINE MALEATE 4 MG PO TABS: ORAL_TABLET | ORAL | 5 refills | Status: DC

## 2017-06-30 NOTE — Patient Instructions (Addendum)
Perennial allergic rhinitis with a probable nonallergic component  Aeroallergen avoidance measures have been discussed and provided in written form.  A prescription has been provided for carbinoxamine 4 mg every 6-8 hours if needed.  A prescription has been provided for azelastine nasal spray, 1-2 sprays per nostril 2 times daily as needed. Proper nasal spray technique has been discussed and demonstrated.   If needed, use fluticasone nasal spray along with azelastine nasal spray.  Nasal saline spray (i.e., Simply Saline) or nasal saline lavage (i.e., NeilMed) is recommended as needed and prior to medicated nasal sprays.  Moderate persistent asthma Currently well controlled, we will stepdown therapy at this time.  A prescription has been provided for Symbicort (budesonide/formoterol) 80/4.5 g,  2 inhalations via spacer device twice a day.   Discontinue Dulera.  I have recommended using albuterol every 6 hours if needed, rather than on a scheduled daily basis.  Subjective and objective measures of pulmonary function will be followed and the treatment plan will be adjusted accordingly.  Recurrent urticaria Given the pain associated with the lesions, as well as the persistence of the lesions, dermatology evaluation with biopsy is warranted.   A referral has been made to [facility] dermatology.   Return in about 3 months (around 09/30/2017), or if symptoms worsen or fail to improve.  Control of Mold Allergen  Mold and fungi can grow on a variety of surfaces provided certain temperature and moisture conditions exist.  Outdoor molds grow on plants, decaying vegetation and soil.  The major outdoor mold, Alternaria and Cladosporium, are found in very high numbers during hot and dry conditions.  Generally, a late Summer - Fall peak is seen for common outdoor fungal spores.  Rain will temporarily lower outdoor mold spore count, but counts rise rapidly when the rainy period ends.  The most  important indoor molds are Aspergillus and Penicillium.  Dark, humid and poorly ventilated basements are ideal sites for mold growth.  The next most common sites of mold growth are the bathroom and the kitchen.  Outdoor [employer] 1. Use air conditioning and keep windows closed 2. Avoid exposure to decaying vegetation. 3. Avoid leaf raking. 4. Avoid grain handling. 5. Consider wearing a face mask if working in moldy areas.  Indoor Mold Control 1. Maintain humidity below 50%. 2. Clean washable surfaces with 5% bleach solution. 3. Remove sources e.g. Contaminated carpets.

## 2017-06-30 NOTE — Assessment & Plan Note (Signed)
   Aeroallergen avoidance measures have been discussed and provided in written form.  A prescription has been provided for carbinoxamine 4 mg every 6-8 hours if needed.  A prescription has been provided for azelastine nasal spray, 1-2 sprays per nostril 2 times daily as needed. Proper nasal spray technique has been discussed and demonstrated.   If needed, use fluticasone nasal spray along with azelastine nasal spray.  Nasal saline spray (i.e., Simply Saline) or nasal saline lavage (i.e., NeilMed) is recommended as needed and prior to medicated nasal sprays.

## 2017-06-30 NOTE — Assessment & Plan Note (Signed)
Currently well controlled, we will stepdown therapy at this time.  A prescription has been provided for Symbicort (budesonide/formoterol) 80/4.5 g, 2 inhalations via spacer device twice a day.   Discontinue Dulera.  I have recommended using albuterol every 6 hours if needed, rather than on a scheduled daily basis.  Subjective and objective measures of pulmonary function will be followed and the treatment plan will be adjusted accordingly.
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
## 2017-06-30 NOTE — Assessment & Plan Note (Signed)
Given the pain associated with the lesions, as well as the persistence of the lesions, dermatology evaluation with biopsy is warranted.   A referral has been made to [facility] dermatology.

## 2017-07-03 ENCOUNTER — Telehealth: Payer: Self-pay

## 2017-07-03 NOTE — Telephone Encounter (Signed)
Patients referral has been faxed to [facility]atology

## 2017-07-03 NOTE — Telephone Encounter (Signed)
-----   Message from [place], LPN sent at 07/05/1273 10:54 AM EDT ----- Regarding: referral Pt need referral to [facility] Dermatology for skin lesions and biopsy. Thank you

## 2017-07-22 NOTE — Telephone Encounter (Signed)
Received this notice from [facility]: "I have finally spoke to this patient and she is scheduled to come in on Friday July 5th at 11am."

## 2017-07-22 NOTE — Telephone Encounter (Signed)
Pt called to discuss her DME. I was skyped and took the call. Pt has not heard from [facility] despite leaving multiple messages. I offered to switch her to another DME but pt declined, asking for me to call [facility] and ask them to call her ASAP, and if they don't, we can switch DMEs. I have reached out to [facility] to resolve this issue. Pt will let me know if she has not spoken with [facility] in the next few days.

## 2017-08-04 ENCOUNTER — Encounter (INDEPENDENT_AMBULATORY_CARE_PROVIDER_SITE_OTHER): Payer: Self-pay | Admitting: Orthopaedic Surgery

## 2017-08-04 ENCOUNTER — Ambulatory Visit (INDEPENDENT_AMBULATORY_CARE_PROVIDER_SITE_OTHER): Payer: PRIVATE HEALTH INSURANCE | Admitting: Orthopaedic Surgery

## 2017-08-04 VITALS — BP 122/77 | HR 77 | Ht 61.0 in | Wt 215.0 lb

## 2017-08-04 DIAGNOSIS — M79661 Pain in right lower leg: Secondary | ICD-10-CM
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
## 2017-08-07 ENCOUNTER — Ambulatory Visit
Admission: RE | Admit: 2017-08-07 | Discharge: 2017-08-07 | Disposition: A | Discharge status: Home / Self Care | Payer: PRIVATE HEALTH INSURANCE | Source: Ambulatory Visit | Attending: Orthopaedic Surgery | Admitting: Orthopaedic Surgery

## 2017-08-07 DIAGNOSIS — M79661 Pain in right lower leg: Secondary | ICD-10-CM

## 2017-08-07 MED ORDER — GADOBENATE DIMEGLUMINE 529 MG/ML IV SOLN
20.0000 mL | Freq: Once | INTRAVENOUS | Status: AC | PRN
  Administered 2017-08-07: 20 mL via INTRAVENOUS

## 2017-08-15 ENCOUNTER — Ambulatory Visit (INDEPENDENT_AMBULATORY_CARE_PROVIDER_SITE_OTHER): Payer: PRIVATE HEALTH INSURANCE | Admitting: Orthopaedic Surgery

## 2017-08-15 ENCOUNTER — Encounter (INDEPENDENT_AMBULATORY_CARE_PROVIDER_SITE_OTHER): Payer: Self-pay | Admitting: Orthopaedic Surgery

## 2017-08-15 VITALS — BP 132/77 | HR 82 | Ht 60.0 in | Wt 215.0 lb

## 2017-08-15 DIAGNOSIS — M25561 Pain in right knee: Secondary | ICD-10-CM

## 2017-08-15 DIAGNOSIS — G8929 Other chronic pain: Secondary | ICD-10-CM | POA: Diagnosis not present
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
## 2017-08-22 ENCOUNTER — Other Ambulatory Visit: Payer: Self-pay | Admitting: Internal Medicine

## 2017-08-22 DIAGNOSIS — R600 Localized edema: Secondary | ICD-10-CM

## 2017-09-03 ENCOUNTER — Telehealth (INDEPENDENT_AMBULATORY_CARE_PROVIDER_SITE_OTHER): Payer: Self-pay

## 2017-09-03 ENCOUNTER — Ambulatory Visit
Admission: RE | Admit: 2017-09-03 | Discharge: 2017-09-03 | Disposition: A | Discharge status: Home / Self Care | Payer: PRIVATE HEALTH INSURANCE | Source: Ambulatory Visit | Attending: Internal Medicine | Admitting: Internal Medicine

## 2017-09-03 DIAGNOSIS — R600 Localized edema: Secondary | ICD-10-CM

## 2017-09-03 MED ORDER — IOPAMIDOL (ISOVUE-300) INJECTION 61%
125.0000 mL | Freq: Once | INTRAVENOUS | Status: AC | PRN
  Administered 2017-09-03: 125 mL via INTRAVENOUS

## 2017-09-03 NOTE — Telephone Encounter (Signed)
Submitted application for SynviscOne, right knee due to patient not being covered for Monovisc through her insurance.

## 2017-09-17 ENCOUNTER — Encounter: Payer: Self-pay | Admitting: Neurology

## 2017-09-22 ENCOUNTER — Encounter: Payer: Self-pay | Admitting: Neurology

## 2017-09-22 ENCOUNTER — Ambulatory Visit: Payer: PRIVATE HEALTH INSURANCE | Admitting: Neurology

## 2017-09-22 VITALS — BP 108/72 | HR 84 | Ht 60.0 in | Wt 216.0 lb

## 2017-09-22 DIAGNOSIS — Z9989 Dependence on other enabling machines and devices: Secondary | ICD-10-CM | POA: Diagnosis not present

## 2017-09-22 DIAGNOSIS — G4733 Obstructive sleep apnea (adult) (pediatric): Secondary | ICD-10-CM

## 2017-09-22 DIAGNOSIS — R519 Headache, unspecified: Secondary | ICD-10-CM

## 2017-09-22 DIAGNOSIS — F39 Unspecified mood [affective] disorder: Secondary | ICD-10-CM

## 2017-09-22 DIAGNOSIS — R51 Headache: Secondary | ICD-10-CM | POA: Diagnosis not present

## 2017-09-22 NOTE — Patient Instructions (Addendum)
Please continue using your CPAP regularly. While your insurance requires that you use CPAP at least 4 hours each night on 70% of the nights, I recommend, that you not skip any nights and use it throughout the night if you can. Getting used to CPAP and staying with the treatment long term does take time and patience and discipline. Untreated obstructive sleep apnea when it is moderate to severe can have an adverse impact on cardiovascular health and raise her risk for heart disease, arrhythmias, hypertension, congestive heart failure, stroke and diabetes. Untreated obstructive sleep apnea causes sleep disruption, nonrestorative sleep, and sleep deprivation. This can have an impact on your day to day functioning and cause daytime sleepiness and impairment of cognitive function, memory loss, mood disturbance, and problems focussing. Using CPAP regularly can improve these symptoms.   Your headaches may get better with time, as your sleep apnea is treated.  You have had recent changes to your medications for mood. Talk to him about potentially seeing a psychiatrist.   Please try to make enough time for sleep, 7 to 8 hours are recommended.
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
## 2017-09-22 NOTE — Progress Notes (Signed)
Order for cpap mask refit sent to [facility].

## 2017-09-30 ENCOUNTER — Ambulatory Visit: Payer: PRIVATE HEALTH INSURANCE | Admitting: Allergy and Immunology

## 2017-09-30 DIAGNOSIS — J309 Allergic rhinitis, unspecified: Secondary | ICD-10-CM

## 2017-10-03 ENCOUNTER — Telehealth (INDEPENDENT_AMBULATORY_CARE_PROVIDER_SITE_OTHER): Payer: Self-pay

## 2017-10-03 ENCOUNTER — Telehealth: Payer: Self-pay | Admitting: Rheumatology

## 2017-10-03 NOTE — Telephone Encounter (Signed)
Talked with patient concerning gel injection, will check to see if patient will be covered for the Synvisc series, instead of the SynviscOne injection.

## 2017-10-03 NOTE — Telephone Encounter (Signed)
Patient called checking on the status of her SynviscOne injection.

## 2017-10-03 NOTE — Telephone Encounter (Signed)
Called and left a VM advisng patient that I spoke with Crystal with [employer] and she stated that benefits would have to be reverified to check and see if medication can be obtained through the [facility].  Will give patient a call once a response has been received.

## 2017-10-09 ENCOUNTER — Other Ambulatory Visit: Payer: Self-pay

## 2017-10-09 ENCOUNTER — Encounter (HOSPITAL_COMMUNITY): Payer: Self-pay | Admitting: Emergency Medicine

## 2017-10-09 ENCOUNTER — Emergency Department (HOSPITAL_COMMUNITY): Payer: PRIVATE HEALTH INSURANCE

## 2017-10-09 ENCOUNTER — Observation Stay (HOSPITAL_COMMUNITY)
Admission: EM | Admit: 2017-10-09 | Discharge: 2017-10-10 | Disposition: A | Discharge status: Home / Self Care | Payer: PRIVATE HEALTH INSURANCE | Attending: Internal Medicine | Admitting: Internal Medicine

## 2017-10-09 DIAGNOSIS — F4323 Adjustment disorder with mixed anxiety and depressed mood: Secondary | ICD-10-CM | POA: Diagnosis not present

## 2017-10-09 DIAGNOSIS — Z87891 Personal history of nicotine dependence: Secondary | ICD-10-CM | POA: Diagnosis not present

## 2017-10-09 DIAGNOSIS — K2270 Barrett's esophagus without dysplasia: Secondary | ICD-10-CM | POA: Diagnosis not present

## 2017-10-09 DIAGNOSIS — Z79899 Other long term (current) drug therapy: Secondary | ICD-10-CM | POA: Insufficient documentation

## 2017-10-09 DIAGNOSIS — M7122 Synovial cyst of popliteal space [Baker], left knee: Secondary | ICD-10-CM | POA: Diagnosis not present

## 2017-10-09 DIAGNOSIS — R072 Precordial pain: Secondary | ICD-10-CM | POA: Diagnosis present

## 2017-10-09 DIAGNOSIS — Z6841 Body Mass Index (BMI) 40.0 and over, adult: Secondary | ICD-10-CM | POA: Diagnosis not present

## 2017-10-09 DIAGNOSIS — R202 Paresthesia of skin: Secondary | ICD-10-CM | POA: Diagnosis present

## 2017-10-09 DIAGNOSIS — I10 Essential (primary) hypertension: Secondary | ICD-10-CM | POA: Diagnosis present

## 2017-10-09 DIAGNOSIS — I2510 Atherosclerotic heart disease of native coronary artery without angina pectoris: Secondary | ICD-10-CM | POA: Diagnosis not present

## 2017-10-09 DIAGNOSIS — I252 Old myocardial infarction: Secondary | ICD-10-CM | POA: Insufficient documentation

## 2017-10-09 DIAGNOSIS — Z87440 Personal history of urinary (tract) infections: Secondary | ICD-10-CM | POA: Insufficient documentation

## 2017-10-09 DIAGNOSIS — Z885 Allergy status to narcotic agent status: Secondary | ICD-10-CM | POA: Insufficient documentation

## 2017-10-09 DIAGNOSIS — Z880 Allergy status to penicillin: Secondary | ICD-10-CM | POA: Diagnosis not present

## 2017-10-09 DIAGNOSIS — R112 Nausea with vomiting, unspecified: Secondary | ICD-10-CM | POA: Diagnosis present

## 2017-10-09 DIAGNOSIS — G4730 Sleep apnea, unspecified: Secondary | ICD-10-CM | POA: Diagnosis not present

## 2017-10-09 DIAGNOSIS — K219 Gastro-esophageal reflux disease without esophagitis: Secondary | ICD-10-CM | POA: Diagnosis present

## 2017-10-09 DIAGNOSIS — R600 Localized edema: Secondary | ICD-10-CM | POA: Diagnosis not present

## 2017-10-09 DIAGNOSIS — Z888 Allergy status to other drugs, medicaments and biological substances status: Secondary | ICD-10-CM | POA: Diagnosis not present

## 2017-10-09 DIAGNOSIS — Z886 Allergy status to analgesic agent status: Secondary | ICD-10-CM | POA: Insufficient documentation

## 2017-10-09 DIAGNOSIS — R0789 Other chest pain: Secondary | ICD-10-CM | POA: Diagnosis not present

## 2017-10-09 DIAGNOSIS — J45909 Unspecified asthma, uncomplicated: Secondary | ICD-10-CM | POA: Insufficient documentation

## 2017-10-09 DIAGNOSIS — F419 Anxiety disorder, unspecified: Secondary | ICD-10-CM

## 2017-10-09 DIAGNOSIS — E559 Vitamin D deficiency, unspecified: Secondary | ICD-10-CM | POA: Diagnosis not present

## 2017-10-09 DIAGNOSIS — E039 Hypothyroidism, unspecified: Secondary | ICD-10-CM | POA: Insufficient documentation

## 2017-10-09 DIAGNOSIS — Z7982 Long term (current) use of aspirin: Secondary | ICD-10-CM | POA: Insufficient documentation

## 2017-10-09 DIAGNOSIS — M797 Fibromyalgia: Secondary | ICD-10-CM | POA: Diagnosis not present

## 2017-10-09 LAB — CBC
HEMATOCRIT: 37.9 % (ref 36.0–46.0)
HEMOGLOBIN: 12.3 g/dL (ref 12.0–15.0)
MCH: 27.5 pg (ref 26.0–34.0)
MCHC: 32.5 g/dL (ref 30.0–36.0)
MCV: 84.6 fL (ref 78.0–100.0)
Platelets: 278 10*3/uL (ref 150–400)
RBC: 4.48 MIL/uL (ref 3.87–5.11)
RDW: 13.1 % (ref 11.5–15.5)
WBC: 6.7 10*3/uL (ref 4.0–10.5)

## 2017-10-09 LAB — BASIC METABOLIC PANEL
ANION GAP: 8 (ref 5–15)
BUN: 10 mg/dL (ref 6–20)
CHLORIDE: 107 mmol/L (ref 98–111)
CO2: 25 mmol/L (ref 22–32)
Calcium: 9.4 mg/dL (ref 8.9–10.3)
Creatinine, Ser: 0.6 mg/dL (ref 0.44–1.00)
GFR calc Af Amer: >60 mL/min (ref >60)
GFR calc non Af Amer: >60 mL/min (ref >60)
Glucose, Bld: 98 mg/dL (ref 70–99)
POTASSIUM: 3.8 mmol/L (ref 3.5–5.1)
Sodium: 140 mmol/L (ref 135–145)

## 2017-10-09 LAB — I-STAT TROPONIN, ED
Troponin i, poc: 0.02 ng/mL (ref 0.00–0.08)
Troponin i, poc: 0.05 ng/mL (ref 0.00–0.08)

## 2017-10-09 LAB — HEPATIC FUNCTION PANEL
ALBUMIN: 3.2 g/dL — AB (ref 3.5–5.0)
ALT: 19 U/L (ref 0–44)
AST: 22 U/L (ref 15–41)
Alkaline Phosphatase: 133 U/L — ABNORMAL HIGH (ref 38–126)
BILIRUBIN TOTAL: 0.6 mg/dL (ref 0.3–1.2)
Bilirubin, Direct: 0.1 mg/dL (ref 0.0–0.2)
Indirect Bilirubin: 0.5 mg/dL (ref 0.3–0.9)
Total Protein: 6.6 g/dL (ref 6.5–8.1)

## 2017-10-09 LAB — LIPASE, BLOOD: Lipase: 28 U/L (ref 11–51)

## 2017-10-09 LAB — TROPONIN I

## 2017-10-09 MED ORDER — CYCLOBENZAPRINE HCL 10 MG PO TABS: 5.0000 mg | ORAL_TABLET | ORAL | Status: DC | PRN

## 2017-10-09 MED ORDER — FAMOTIDINE IN NACL 20-0.9 MG/50ML-% IV SOLN
20.0000 mg | Freq: Two times a day (BID) | INTRAVENOUS | Status: DC
  Administered 2017-10-09: 20 mg via INTRAVENOUS
  Filled 2017-10-09 (×2): qty 50

## 2017-10-09 MED ORDER — DIPHENHYDRAMINE HCL 50 MG/ML IJ SOLN
25.0000 mg | Freq: Once | INTRAMUSCULAR | Status: AC
  Administered 2017-10-09: 25 mg via INTRAVENOUS
  Filled 2017-10-09: qty 1

## 2017-10-09 MED ORDER — PANTOPRAZOLE SODIUM 40 MG PO TBEC
40.0000 mg | DELAYED_RELEASE_TABLET | Freq: Every day | ORAL | Status: DC
  Administered 2017-10-10: 40 mg via ORAL
  Filled 2017-10-09: qty 1

## 2017-10-09 MED ORDER — SUCRALFATE 1 G PO TABS
1.0000 g | ORAL_TABLET | Freq: Three times a day (TID) | ORAL | Status: DC
  Administered 2017-10-10 (×2): 1 g via ORAL
  Filled 2017-10-09 (×3): qty 1

## 2017-10-09 MED ORDER — LEVOTHYROXINE SODIUM 125 MCG PO TABS
125.0000 ug | ORAL_TABLET | Freq: Two times a day (BID) | ORAL | Status: DC
  Administered 2017-10-10: 125 ug via ORAL
  Filled 2017-10-09 (×2): qty 1

## 2017-10-09 MED ORDER — ALPRAZOLAM 0.5 MG PO TABS
0.5000 mg | ORAL_TABLET | Freq: Every evening | ORAL | Status: DC | PRN
  Administered 2017-10-09: 0.5 mg via ORAL
  Filled 2017-10-09: qty 1

## 2017-10-09 MED ORDER — HYDROMORPHONE HCL 1 MG/ML IJ SOLN
1.0000 mg | Freq: Once | INTRAMUSCULAR | Status: AC
  Administered 2017-10-09: 1 mg via INTRAVENOUS
  Filled 2017-10-09: qty 1

## 2017-10-09 MED ORDER — IOPAMIDOL (ISOVUE-370) INJECTION 76%
100.0000 mL | Freq: Once | INTRAVENOUS | Status: AC
  Administered 2017-10-09: 100 mL via INTRAVENOUS

## 2017-10-09 MED ORDER — IOPAMIDOL (ISOVUE-370) INJECTION 76%
INTRAVENOUS | Status: AC
  Filled 2017-10-09: qty 100

## 2017-10-09 MED ORDER — FAMOTIDINE IN NACL 20-0.9 MG/50ML-% IV SOLN
20.0000 mg | Freq: Once | INTRAVENOUS | Status: AC
  Administered 2017-10-09: 20 mg via INTRAVENOUS
  Filled 2017-10-09: qty 50

## 2017-10-09 MED ORDER — METOCLOPRAMIDE HCL 5 MG/ML IJ SOLN
10.0000 mg | Freq: Once | INTRAMUSCULAR | Status: AC
  Administered 2017-10-09: 10 mg via INTRAVENOUS
  Filled 2017-10-09: qty 2

## 2017-10-09 MED ORDER — ONDANSETRON HCL 4 MG/2ML IJ SOLN
4.0000 mg | Freq: Once | INTRAMUSCULAR | Status: AC | PRN
  Administered 2017-10-09: 4 mg via INTRAVENOUS
  Filled 2017-10-09: qty 2

## 2017-10-09 MED ORDER — ALOSETRON HCL 0.5 MG PO TABS: 0.5000 mg | ORAL_TABLET | Freq: Two times a day (BID) | ORAL | Status: DC

## 2017-10-09 MED ORDER — POLYETHYLENE GLYCOL 3350 17 G PO PACK: 17.0000 g | PACK | Freq: Every day | ORAL | Status: DC | PRN

## 2017-10-09 MED ORDER — ENOXAPARIN SODIUM 40 MG/0.4ML ~~LOC~~ SOLN: 40.0000 mg | Freq: Every day | SUBCUTANEOUS | Status: DC

## 2017-10-09 MED ORDER — ASPIRIN EC 81 MG PO TBEC
81.0000 mg | DELAYED_RELEASE_TABLET | Freq: Every day | ORAL | Status: DC
  Administered 2017-10-10: 81 mg via ORAL
  Filled 2017-10-09: qty 1

## 2017-10-09 MED ORDER — TRAMADOL HCL 50 MG PO TABS
50.0000 mg | ORAL_TABLET | Freq: Four times a day (QID) | ORAL | Status: DC | PRN
  Administered 2017-10-10: 50 mg via ORAL
  Filled 2017-10-09: qty 1

## 2017-10-09 MED ORDER — MOMETASONE FURO-FORMOTEROL FUM 100-5 MCG/ACT IN AERO
2.0000 | INHALATION_SPRAY | Freq: Two times a day (BID) | RESPIRATORY_TRACT | Status: DC
  Administered 2017-10-10: 2 via RESPIRATORY_TRACT
  Filled 2017-10-09 (×2): qty 8.8

## 2017-10-09 MED ORDER — LOSARTAN POTASSIUM 50 MG PO TABS
50.0000 mg | ORAL_TABLET | Freq: Every day | ORAL | Status: DC
  Administered 2017-10-10: 50 mg via ORAL
  Filled 2017-10-09: qty 1

## 2017-10-09 MED ORDER — ONDANSETRON HCL 4 MG/2ML IJ SOLN
4.0000 mg | Freq: Once | INTRAMUSCULAR | Status: AC
  Administered 2017-10-09: 4 mg via INTRAVENOUS
  Filled 2017-10-09: qty 2

## 2017-10-09 MED ORDER — TOPIRAMATE 25 MG PO TABS
50.0000 mg | ORAL_TABLET | Freq: Every morning | ORAL | Status: DC
  Administered 2017-10-10: 50 mg via ORAL
  Filled 2017-10-09: qty 2

## 2017-10-09 MED ORDER — ROSUVASTATIN CALCIUM 40 MG PO TABS
40.0000 mg | ORAL_TABLET | Freq: Every day | ORAL | Status: DC
  Administered 2017-10-10: 40 mg via ORAL
  Filled 2017-10-09: qty 1
  Filled 2017-10-09: qty 2

## 2017-10-09 MED ORDER — PROMETHAZINE HCL 25 MG PO TABS
12.5000 mg | ORAL_TABLET | Freq: Four times a day (QID) | ORAL | Status: DC | PRN
  Administered 2017-10-09: 12.5 mg via ORAL
  Filled 2017-10-09: qty 1

## 2017-10-09 MED ORDER — METOPROLOL SUCCINATE ER 25 MG PO TB24
25.0000 mg | ORAL_TABLET | Freq: Every day | ORAL | Status: DC
  Administered 2017-10-10: 25 mg via ORAL
  Filled 2017-10-09: qty 1

## 2017-10-09 MED ORDER — TOPIRAMATE 100 MG PO TABS
100.0000 mg | ORAL_TABLET | Freq: Every day | ORAL | Status: DC
  Filled 2017-10-09: qty 1

## 2017-10-09 MED ORDER — HYDROCHLOROTHIAZIDE 12.5 MG PO CAPS
12.5000 mg | ORAL_CAPSULE | Freq: Every day | ORAL | Status: DC
  Filled 2017-10-09: qty 1

## 2017-10-09 MED ORDER — FUROSEMIDE 20 MG PO TABS
20.0000 mg | ORAL_TABLET | Freq: Every day | ORAL | Status: DC
  Filled 2017-10-09: qty 1

## 2017-10-09 MED ORDER — HYDROMORPHONE HCL 1 MG/ML IJ SOLN: 1.0000 mg | Freq: Four times a day (QID) | INTRAMUSCULAR | Status: DC | PRN

## 2017-10-09 MED ORDER — PANTOPRAZOLE SODIUM 40 MG IV SOLR
40.0000 mg | INTRAVENOUS | Status: AC
  Administered 2017-10-09: 40 mg via INTRAVENOUS
  Filled 2017-10-09: qty 40

## 2017-10-09 MED ORDER — ALBUTEROL SULFATE (2.5 MG/3ML) 0.083% IN NEBU: 2.5000 mg | INHALATION_SOLUTION | Freq: Four times a day (QID) | RESPIRATORY_TRACT | Status: DC | PRN

## 2017-10-09 MED ORDER — NITROGLYCERIN 2 % TD OINT
1.0000 [in_us] | TOPICAL_OINTMENT | Freq: Four times a day (QID) | TRANSDERMAL | Status: DC
  Administered 2017-10-09 – 2017-10-10 (×3): 1 [in_us] via TOPICAL
  Filled 2017-10-09 (×3): qty 1

## 2017-10-09 MED ORDER — GABAPENTIN 300 MG PO CAPS
300.0000 mg | ORAL_CAPSULE | Freq: Every day | ORAL | Status: DC
  Administered 2017-10-10: 300 mg via ORAL
  Filled 2017-10-09: qty 1

## 2017-10-09 MED ORDER — LORAZEPAM 2 MG/ML IJ SOLN
1.0000 mg | Freq: Once | INTRAMUSCULAR | Status: AC
  Administered 2017-10-09: 1 mg via INTRAVENOUS
  Filled 2017-10-09: qty 1

## 2017-10-09 MED ORDER — LOSARTAN POTASSIUM-HCTZ 50-12.5 MG PO TABS: 1.0000 | ORAL_TABLET | Freq: Every day | ORAL | Status: DC

## 2017-10-09 NOTE — ED Notes (Signed)
IV team at bedside 

## 2017-10-09 NOTE — ED Notes (Signed)
Patient return from radiology.

## 2017-10-09 NOTE — ED Notes (Signed)
MD made aware, pt still vomiting, dry heaving, reports 9/10 CP. Reports will order meds.

## 2017-10-09 NOTE — ED Triage Notes (Signed)
Onset today driving to work developed chest pain headache and shortness of breath. While at work bilateral upper extremity tingling. Prior to arrival took aspirin 324 mg PO, Zantac 150 mg PO, and 2 nitro SL. Pain continues to be 10/10 pressure. Alert answering and following commands appropriate.

## 2017-10-09 NOTE — ED Notes (Signed)
Prior to arrival iv attempted x3. Able to obtain IV x1 22g now needs 18g. Unable to obtain ordered IV team to attempt. Patient verbalized understanding.
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
## 2017-10-09 NOTE — ED Notes (Signed)
Patient transported to CT 

## 2017-10-09 NOTE — ED Notes (Signed)
Admitting at bedside- will transport after MD finishes assessment

## 2017-10-09 NOTE — ED Notes (Signed)
Pt requesting something else for pain. MD unavailable, PA made aware will review chart.
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
## 2017-10-09 NOTE — Progress Notes (Signed)
Placed patient on CPAP.  Patient stated she uses a full face mask at home.  Patient also stated her setting is 14, so patient is resting comfortably with cpap on at home setting.

## 2017-10-10 ENCOUNTER — Observation Stay (HOSPITAL_BASED_OUTPATIENT_CLINIC_OR_DEPARTMENT_OTHER): Payer: PRIVATE HEALTH INSURANCE

## 2017-10-10 DIAGNOSIS — R0789 Other chest pain: Secondary | ICD-10-CM | POA: Diagnosis not present

## 2017-10-10 DIAGNOSIS — R079 Chest pain, unspecified: Secondary | ICD-10-CM

## 2017-10-10 DIAGNOSIS — K2270 Barrett's esophagus without dysplasia: Secondary | ICD-10-CM

## 2017-10-10 DIAGNOSIS — I10 Essential (primary) hypertension: Secondary | ICD-10-CM | POA: Diagnosis not present

## 2017-10-10 LAB — URINALYSIS, ROUTINE W REFLEX MICROSCOPIC
Bilirubin Urine: NEGATIVE
Glucose, UA: NEGATIVE mg/dL
Hgb urine dipstick: NEGATIVE
KETONES UR: NEGATIVE mg/dL
Leukocytes, UA: NEGATIVE
Nitrite: NEGATIVE
PH: 8 (ref 5.0–8.0)
Protein, ur: NEGATIVE mg/dL
Specific Gravity, Urine: 1.009 (ref 1.005–1.030)

## 2017-10-10 LAB — ECHOCARDIOGRAM COMPLETE
Height: 61 in
Weight: 3472 oz

## 2017-10-10 LAB — COMPREHENSIVE METABOLIC PANEL
ALT: 57 U/L — ABNORMAL HIGH (ref 0–44)
ANION GAP: 10 (ref 5–15)
AST: 58 U/L — ABNORMAL HIGH (ref 15–41)
Albumin: 3 g/dL — ABNORMAL LOW (ref 3.5–5.0)
Alkaline Phosphatase: 143 U/L — ABNORMAL HIGH (ref 38–126)
BILIRUBIN TOTAL: 1 mg/dL (ref 0.3–1.2)
BUN: 8 mg/dL (ref 6–20)
CO2: 26 mmol/L (ref 22–32)
Calcium: 9 mg/dL (ref 8.9–10.3)
Chloride: 107 mmol/L (ref 98–111)
Creatinine, Ser: 0.68 mg/dL (ref 0.44–1.00)
GFR calc Af Amer: >60 mL/min (ref >60)
GFR calc non Af Amer: >60 mL/min (ref >60)
Glucose, Bld: 85 mg/dL (ref 70–99)
POTASSIUM: 3.8 mmol/L (ref 3.5–5.1)
Sodium: 143 mmol/L (ref 135–145)
TOTAL PROTEIN: 6.1 g/dL — AB (ref 6.5–8.1)

## 2017-10-10 LAB — TROPONIN I

## 2017-10-10 LAB — HIV ANTIBODY (ROUTINE TESTING W REFLEX): HIV Screen 4th Generation wRfx: NONREACTIVE

## 2017-10-10 MED ORDER — TRAMADOL HCL 50 MG PO TABS: 50.0000 mg | ORAL_TABLET | Freq: Once | ORAL | Status: DC

## 2017-10-10 NOTE — Plan of Care (Signed)
Pt understands plan of care. Ambulatory with steady gait remaining free of falls. Diagnostic tests remaining WDL. Pt verbalized chest pain relief by medications and denies chest pain.  Pt was able to tolerate PO liquid foods. Relieved of nausea and vomiting. Pt progressing towards discharge.

## 2017-10-10 NOTE — Discharge Summary (Addendum)
Physician Discharge Summary  Vanessa Berg TFT:[id number] DOB: [birth year] DOA: 10/09/2017  PCP: Leanna Battles, MD  Admit date: 10/09/2017 Discharge date: 10/10/2017  Time spent: 65 minutes  Recommendations for Outpatient Follow-up:  1. Follow up with Dr. Benson Norway gastroenterology 1-2 weeks for further evaluation if symptoms reoccur  2. LFTs 1 week 3. Follow up for PCP for routine studies for reported weight loss    Discharge Diagnoses:  Principal Problem:   Atypical chest pain Active Problems:   Essential hypertension   Barrett's esophagus   Morbid obesity with BMI of 45.0-49.9, adult (HCC)   GERD (gastroesophageal reflux disease)   Adjustment disorder with mixed anxiety and depressed mood   Hypertension   Discharge Condition: stable and chest pain free at discharge  Diet recommendation: soft  Filed Weights   10/09/17 0918  Weight: 98.4 kg    History of present illness:  Vanessa Berg is a 57 y.o. female with history h/o gastroesophageal reflux disease, Barrett's esophagus who follows Dr. Benson Norway as outpatient, asthma, hypothyroidism, sleep apnea who uses CPAP at home, endometriosis status post hysterectomy, anxiety disorder, ?CAD on aspirin at baseline presented on 10/09/17 with complaints of chest pain.  Patient stated she went to bed the night before feeling well, used her CPAP and woke up in morning with no problems.  She however experienced acute onset of chest pain on her way to work around 7:30 AM, described as left-sided, 9/10, stabbing pleuritic pain associated with tingling in both her upper extremities, neck stiffness, headache, nausea, sweating and palpitations.  She stated at work her blood pressure was checked and was somewhat elevated.  She presented to the ED and received sublingual nitro, aspirin, IV Benadryl, Dilaudid, Ativan, Reglan, Zofran and Protonix after which her pain level is down to 7/10.  She did not respond to sublingual  nitro per ED physician.  She also reported an unintentional weight loss of 30 pounds in 2 months.  She has history of hiatal hernia.  Her last endoscopy was in 2016 which showed Barrett's esophagus and she is due for repeat endoscopy this year.  She follows Dr. Skeet Latch for cardiology and was apparently admitted to [facility] in October 2018, underwent echocardiogram/nuclear stress test as well as 48-hour Holter monitoring at that time.  She stated she was told to take baby aspirin but did not need a a cardiac cath.  She reported compliance with Protonix daily and using CPAP at night.  Hospital Course:  1.  Chest pain:  Atypical. Likely GI source.  Cardiac risk factors include obesity, hypertension, sleep apnea and family history. Troponin negatie x3, echo with EF 55-60%. EKG without acute changes.no further chest pain. Will need followup with Dr Benson Norway in 1-2 weeks. Tolerating soft food at discharge. Discharge with PP  2.  GERD/Barrett's esophagus: Continue PPI.  Plan as described above.   3.?  CAD: See #1  Continue baby aspirin for now, enteric-coated.  4.  Anxiety disorder: Resume home medications.  Likely contributing to paresthesias.  5.  Sleep apnea: Resume CPAP  6.  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 Stable at baseline. Given lasix Lasix while in hospital. .  Procedures:  echo  Consultations:  none  Discharge Exam: Vitals:   10/10/17 0527 10/10/17 1000  BP: (!) 102/51 107/71  Pulse: 87 91  Resp: 18  Temp: 97.9 F (36.6 C)   SpO2: 98% 100%    General: well nourished sitting on side of bed in no acute distress requesting peanutbutter and crackers Cardiovascular: rrr no MGR no LE edema Respiratory: normal effort BS clear bilaterally no wheeze Abdomen: obese soft +BS no guarding or  rebounding.   Discharge Instructions   Discharge Instructions    Diet - low sodium heart healthy   Complete by:  As directed    Discharge instructions   Complete by:  As directed    Schedule appointment with Dr. Benson Norway   Increase activity slowly   Complete by:  As directed       Allergies  Allergen Reactions  . Bee Venom Anaphylaxis, Hives and Swelling    Swelling of throat and whole body   . Ibuprofen Other (See Comments)    seizures  . Morphine Hives and Shortness Of Breath  . Morphine And Related Shortness Of Breath  . Nsaids Anaphylaxis, Hives, Shortness Of Breath and Swelling    Swelling of eyes and throat   . Tylenol [Acetaminophen] Anaphylaxis, Hives and Shortness Of Breath    Difficulty breathing, hives, swelling  . Ibuprofen Other (See Comments)    seizure  . Latex Hives and Itching  . Amoxicillin Other (See Comments)  . Cymbalta [Duloxetine Hcl]     nausea  . Sertraline     Could have been causing her panic attacks.      The results of significant diagnostics from this hospitalization (including imaging, microbiology, ancillary and laboratory) are listed below for reference.    Significant Diagnostic Studies: Dg Chest 2 View  Result Date: 10/09/2017 CLINICAL DATA:  Chest pain radiating to left arm and face beginning this morning. Shortness of breath. Nausea. EXAM: CHEST - 2 VIEW COMPARISON:  11/19/2016 FINDINGS: The heart size and mediastinal contours are within normal limits. Low lung volumes again noted, however both lungs are clear. The visualized skeletal structures are unremarkable. IMPRESSION: Stable exam.  No active cardiopulmonary disease. Electronically Signed   By: Earle Gell M.D.   On: 10/09/2017 09:57   Ct Angio Chest/abd/pel For Dissection W And/or W/wo  Result Date: 10/09/2017 CLINICAL DATA:  Chest and abdominal pain.  Upper extremity tingling EXAM: CT ANGIOGRAPHY CHEST, ABDOMEN AND PELVIS TECHNIQUE: Initially, axial CT images were obtained  through the aortic arch region without intravenous contrast. Multidetector CT imaging through the chest, abdomen and pelvis was performed using the standard protocol during bolus administration of intravenous contrast. Multiplanar reconstructed images and MIPs were obtained and reviewed to evaluate the vascular anatomy. CONTRAST:  100 mL ISOVUE-370 IOPAMIDOL (ISOVUE-370) INJECTION 76% COMPARISON:  Chest CT November 19, 2016; CT abdomen and pelvis September 03, 2017; chest radiograph October 09, 2017 FINDINGS: CTA CHEST FINDINGS Cardiovascular: No intramural hematoma is seen in noncontrast enhanced portions of the visualized thoracic aorta there is no thoracic aortic aneurysm or dissection. Visualized great vessels appear unremarkable. No aneurysm or dissection seen in visualized great vessels. No evident pulmonary embolus. There is no appreciable pericardial effusion or pericardial thickening. Mediastinum/Nodes: Thyroid diminutive. There is no appreciable thoracic adenopathy. There is a stable focal hiatal hernia. Lungs/Pleura: There is patchy atelectatic change bilaterally. There is no evident edema or consolidation. No pleural effusion or pleural thickening evident. Musculoskeletal: No blastic or lytic bone lesions. No chest wall lesions evident. Review of the MIP images confirms the above findings. CTA ABDOMEN AND PELVIS FINDINGS VASCULAR Aorta: There is no demonstrable abdominal aortic aneurysm or dissection. There is no  appreciable narrowing of the aorta. No appreciable atherosclerotic irregularity noted. Celiac: Celiac artery and its major branches appear widely patent without appreciable obstruction. No aneurysm or dissection in the celiac artery or its major branches. SMA: Superior mesenteric artery and its major branches appear widely patent. No aneurysm or dissection evident. Renals: There is a single renal artery on each side. No renal artery aneurysm or dissection. No fibromuscular dysplasia evident. Renal  arteries and their respective branches appear widely patent. IMA: Inferior mesenteric artery and its branches appear patent. No aneurysm or dissection evident. Inflow: Pelvic arterial vessels appear widely patent throughout their courses. No aneurysm or dissection evident. The proximal superficial femoral and profunda femoral arteries are likewise widely patent. Veins: No obvious venous abnormality within the limitations of this arterial phase study. Review of the MIP images confirms the above findings. NON-VASCULAR Hepatobiliary: No focal liver lesions are evident. Gallbladder is absent. There is no biliary duct dilatation. Pancreas: There is no pancreatic mass or inflammatory focus. Spleen: No splenic lesions are evident. Adrenals/Urinary Tract: Adrenals bilaterally appear normal. Kidneys bilaterally show no evident mass or hydronephrosis on either side. There is no renal or ureteral calculus on either side. Urinary bladder is midline with wall thickness within normal limits. Stomach/Bowel: There is no appreciable bowel wall or mesenteric thickening. No evident bowel obstruction. No free air or portal venous air. Lymphatic: There is no demonstrable adenopathy in the abdomen or pelvis. Reproductive: Uterus is absent.  There is no evident pelvic mass. Other: Appendix appears normal. There is no evident abscess or ascites in the abdomen or pelvis. There is a small ventral hernia containing only fat. Musculoskeletal: There are no blastic or lytic bone lesions. No intramuscular or abdominal wall lesions are evident. Review of the MIP images confirms the above findings. IMPRESSION: CT angiogram chest: 1. No thoracic aortic aneurysm or dissection. Visualized great vessels appear unremarkable. No evident pulmonary embolus. 2. Atelectatic change in both lower lung zones. No edema or consolidation. 3.  Stable hiatal hernia. 4.  No demonstrable thoracic adenopathy. CT angiogram abdomen; CT angiogram pelvis: 1. No aneurysm or  dissection involving the aorta, major mesenteric, and major pelvic arterial vessels. No fibromuscular dysplasia. No appreciable obstructive disease noted in the arterial vessels of the abdomen and pelvis. 2. No evident bowel obstruction. No abscess in the abdomen or pelvis. Appendix appears normal. 3.  Gallbladder and uterus absent. 4. No renal or ureteral calculus. No hydronephrosis. Urinary bladder wall thickness normal. 5.  Small ventral hernia containing only fat. Electronically Signed   By: Lowella Grip III M.D.   On: 10/09/2017 12:00    Microbiology: No results found for this or any previous visit (from the past 240 hour(s)).   Labs: Basic Metabolic Panel: Recent Labs  Lab 10/09/17 0929 10/10/17 0526  NA 140 143  K 3.8 3.8  CL 107 107  CO2 25 26  GLUCOSE 98 85  BUN 10 8  CREATININE 0.60 0.68  CALCIUM 9.4 9.0   Liver Function Tests: Recent Labs  Lab 10/09/17 0929 10/10/17 0526  AST 22 58*  ALT 19 57*  ALKPHOS 133* 143*  BILITOT 0.6 1.0  PROT 6.6 6.1*  ALBUMIN 3.2* 3.0*   Recent Labs  Lab 10/09/17 0929  LIPASE 28   No results for input(s): AMMONIA in the last 168 hours. CBC: Recent Labs  Lab 10/09/17 0929  WBC 6.7  HGB 12.3  HCT 37.9  MCV 84.6  PLT 278   Cardiac Enzymes: Recent Labs  Lab  10/09/17 1847 10/09/17 2349 10/10/17 0526  TROPONINI <0.03 <0.03 <0.03   BNP: BNP (last 3 results) No results for input(s): BNP in the last 8760 hours.  ProBNP (last 3 results) No results for input(s): PROBNP in the last 8760 hours.  CBG: No results for input(s): GLUCAP in the last 168 hours.     Signed:  Eulogio Bear DO  [facility] 10/10/2017, 2:07 PM

## 2017-10-10 NOTE — Progress Notes (Signed)
  Echocardiogram 2D Echocardiogram has been performed.  Vanessa Berg 10/10/2017, 9:25 AM

## 2017-10-21 ENCOUNTER — Other Ambulatory Visit: Payer: Self-pay | Admitting: Internal Medicine

## 2017-10-21 DIAGNOSIS — N644 Mastodynia: Secondary | ICD-10-CM

## 2017-10-21 DIAGNOSIS — Z803 Family history of malignant neoplasm of breast: Secondary | ICD-10-CM

## 2017-10-27 ENCOUNTER — Other Ambulatory Visit: Payer: Self-pay | Admitting: Gastroenterology

## 2017-10-28 ENCOUNTER — Telehealth (INDEPENDENT_AMBULATORY_CARE_PROVIDER_SITE_OTHER): Payer: Self-pay

## 2017-10-28 NOTE — Telephone Encounter (Signed)
Called and left a VM advising patient that she would need to call [employer] to see if Monovisc, right knee injection would be covered under her pharmacy benefits.Per [employer], they will not disclose patient information to third party vendors. [employer]# [id number]

## 2017-10-30 ENCOUNTER — Ambulatory Visit
Admission: RE | Admit: 2017-10-30 | Discharge: 2017-10-30 | Disposition: A | Discharge status: Home / Self Care | Payer: PRIVATE HEALTH INSURANCE | Source: Ambulatory Visit | Attending: Internal Medicine | Admitting: Internal Medicine

## 2017-10-30 DIAGNOSIS — N644 Mastodynia: Secondary | ICD-10-CM

## 2017-10-30 DIAGNOSIS — Z803 Family history of malignant neoplasm of breast: Secondary | ICD-10-CM

## 2017-10-30 MED ORDER — GADOBENATE DIMEGLUMINE 529 MG/ML IV SOLN
20.0000 mL | Freq: Once | INTRAVENOUS | Status: AC | PRN
  Administered 2017-10-30: 20 mL via INTRAVENOUS

## 2017-10-31 ENCOUNTER — Other Ambulatory Visit: Payer: Self-pay | Admitting: Internal Medicine

## 2017-10-31 DIAGNOSIS — R9389 Abnormal findings on diagnostic imaging of other specified body structures: Secondary | ICD-10-CM

## 2017-11-03 ENCOUNTER — Ambulatory Visit
Admission: RE | Admit: 2017-11-03 | Discharge: 2017-11-03 | Disposition: A | Discharge status: Home / Self Care | Payer: PRIVATE HEALTH INSURANCE | Source: Ambulatory Visit | Attending: Internal Medicine | Admitting: Internal Medicine

## 2017-11-03 DIAGNOSIS — R9389 Abnormal findings on diagnostic imaging of other specified body structures: Secondary | ICD-10-CM

## 2017-11-03 HISTORY — PX: BREAST BIOPSY: SHX20

## 2017-11-03 MED ORDER — GADOBENATE DIMEGLUMINE 529 MG/ML IV SOLN
20.0000 mL | Freq: Once | INTRAVENOUS | Status: AC | PRN
  Administered 2017-11-03: 20 mL via INTRAVENOUS

## 2017-11-05 ENCOUNTER — Other Ambulatory Visit: Payer: PRIVATE HEALTH INSURANCE

## 2017-11-28 ENCOUNTER — Ambulatory Visit (HOSPITAL_COMMUNITY): Payer: PRIVATE HEALTH INSURANCE | Admitting: Anesthesiology

## 2017-11-28 ENCOUNTER — Ambulatory Visit (HOSPITAL_COMMUNITY)
Admission: RE | Admit: 2017-11-28 | Discharge: 2017-11-28 | Disposition: A | Discharge status: Home / Self Care | Payer: PRIVATE HEALTH INSURANCE | Source: Ambulatory Visit | Attending: Gastroenterology | Admitting: Gastroenterology

## 2017-11-28 ENCOUNTER — Encounter (HOSPITAL_COMMUNITY)
Admission: RE | Disposition: A | Discharge status: Home / Self Care | Payer: Self-pay | Source: Ambulatory Visit | Attending: Gastroenterology

## 2017-11-28 ENCOUNTER — Encounter (HOSPITAL_COMMUNITY): Payer: Self-pay | Admitting: *Deleted

## 2017-11-28 DIAGNOSIS — K210 Gastro-esophageal reflux disease with esophagitis: Secondary | ICD-10-CM | POA: Diagnosis not present

## 2017-11-28 DIAGNOSIS — Z87891 Personal history of nicotine dependence: Secondary | ICD-10-CM | POA: Diagnosis not present

## 2017-11-28 DIAGNOSIS — I252 Old myocardial infarction: Secondary | ICD-10-CM | POA: Insufficient documentation

## 2017-11-28 DIAGNOSIS — Z885 Allergy status to narcotic agent status: Secondary | ICD-10-CM | POA: Diagnosis not present

## 2017-11-28 DIAGNOSIS — E039 Hypothyroidism, unspecified: Secondary | ICD-10-CM | POA: Diagnosis not present

## 2017-11-28 DIAGNOSIS — K2270 Barrett's esophagus without dysplasia: Secondary | ICD-10-CM | POA: Diagnosis not present

## 2017-11-28 DIAGNOSIS — Z6841 Body Mass Index (BMI) 40.0 and over, adult: Secondary | ICD-10-CM | POA: Insufficient documentation

## 2017-11-28 DIAGNOSIS — I10 Essential (primary) hypertension: Secondary | ICD-10-CM | POA: Diagnosis not present

## 2017-11-28 DIAGNOSIS — M797 Fibromyalgia: Secondary | ICD-10-CM | POA: Diagnosis not present

## 2017-11-28 DIAGNOSIS — J45909 Unspecified asthma, uncomplicated: Secondary | ICD-10-CM | POA: Insufficient documentation

## 2017-11-28 DIAGNOSIS — M1990 Unspecified osteoarthritis, unspecified site: Secondary | ICD-10-CM | POA: Insufficient documentation

## 2017-11-28 DIAGNOSIS — R0789 Other chest pain: Secondary | ICD-10-CM | POA: Diagnosis present

## 2017-11-28 HISTORY — PX: BIOPSY: SHX5522

## 2017-11-28 HISTORY — PX: ESOPHAGOGASTRODUODENOSCOPY (EGD) WITH PROPOFOL: SHX5813

## 2017-11-28 SURGERY — ESOPHAGOGASTRODUODENOSCOPY (EGD) WITH PROPOFOL
Anesthesia: General

## 2017-11-28 MED ORDER — LACTATED RINGERS IV SOLN
INTRAVENOUS | Status: DC | PRN
  Administered 2017-11-28: 09:00:00 via INTRAVENOUS

## 2017-11-28 MED ORDER — ONDANSETRON HCL 4 MG/2ML IJ SOLN
INTRAMUSCULAR | Status: DC | PRN
  Administered 2017-11-28: 4 mg via INTRAVENOUS

## 2017-11-28 MED ORDER — SODIUM CHLORIDE 0.9 % IV SOLN: INTRAVENOUS | Status: DC

## 2017-11-28 MED ORDER — FENTANYL CITRATE (PF) 100 MCG/2ML IJ SOLN
INTRAMUSCULAR | Status: DC | PRN
  Administered 2017-11-28: 50 ug via INTRAVENOUS

## 2017-11-28 MED ORDER — FENTANYL CITRATE (PF) 100 MCG/2ML IJ SOLN
INTRAMUSCULAR | Status: AC
  Filled 2017-11-28: qty 2

## 2017-11-28 MED ORDER — DEXAMETHASONE SODIUM PHOSPHATE 10 MG/ML IJ SOLN
INTRAMUSCULAR | Status: DC | PRN
  Administered 2017-11-28: 10 mg via INTRAVENOUS

## 2017-11-28 MED ORDER — PROPOFOL 10 MG/ML IV BOLUS
INTRAVENOUS | Status: AC
  Filled 2017-11-28: qty 40

## 2017-11-28 MED ORDER — SUCCINYLCHOLINE CHLORIDE 200 MG/10ML IV SOSY
PREFILLED_SYRINGE | INTRAVENOUS | Status: DC | PRN
  Administered 2017-11-28: 100 mg via INTRAVENOUS

## 2017-11-28 MED ORDER — PROPOFOL 10 MG/ML IV BOLUS
INTRAVENOUS | Status: DC | PRN
  Administered 2017-11-28: 170 mg via INTRAVENOUS

## 2017-11-28 MED ORDER — LIDOCAINE 2% (20 MG/ML) 5 ML SYRINGE
INTRAMUSCULAR | Status: DC | PRN
  Administered 2017-11-28: 100 mg via INTRAVENOUS

## 2017-11-28 SURGICAL SUPPLY — 14 items
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
## 2017-11-28 NOTE — Anesthesia Preprocedure Evaluation (Addendum)
Anesthesia Evaluation  Patient identified by MRN, date of birth, ID band Patient awake    Reviewed: Allergy & Precautions, NPO status , Patient's Chart, lab work & pertinent test results  History of Anesthesia Complications (+) PONV and history of anesthetic complications  Airway Mallampati: I  TM Distance: >3 FB Neck ROM: Full    Dental  (+) Dental Advisory Given, Partial Upper, Partial Lower   Pulmonary asthma , former smoker,    breath sounds clear to auscultation       Cardiovascular hypertension, Pt. on home beta blockers and Pt. on medications + Past MI   Rhythm:Regular Rate:Normal   '19 TTE - EF 55% to 60%. PASP was mildly increased. PA peak pressure: 35 mm Hg  '19 Myoperfusion - Nuclear stress EF: 60%. The left ventricular ejection fraction is normal (55-65%). Visually the ejection fraction appears to be even higher. The study is normal. No evidence of ischemia or previous infarction. This is a low risk study.   Neuro/Psych  Headaches, PSYCHIATRIC DISORDERS Anxiety    GI/Hepatic Neg liver ROS, hiatal hernia, GERD  Poorly Controlled,  Endo/Other  Hypothyroidism Morbid obesity  Renal/GU negative Renal ROS     Musculoskeletal  (+) Arthritis , Fibromyalgia -  Abdominal (+) + obese,   Peds  Hematology negative hematology ROS (+)   Anesthesia Other Findings   Reproductive/Obstetrics                            Anesthesia Physical Anesthesia Plan  ASA: III  Anesthesia Plan: General   Post-op Pain Management:    Induction: Intravenous  PONV Risk Score and Plan: 4 or greater and Propofol infusion and Treatment may vary due to age or medical condition  Airway Management Planned: Oral ETT  Additional Equipment: None  Intra-op Plan:   Post-operative Plan: Extubation in OR  Informed Consent: I have reviewed the patients History and Physical, chart, labs and discussed the  procedure including the risks, benefits and alternatives for the proposed anesthesia with the patient or authorized representative who has indicated his/her understanding and acceptance.   Dental advisory given  Plan Discussed with: CRNA and Anesthesiologist  Anesthesia Plan Comments:        Anesthesia Quick Evaluation

## 2017-11-28 NOTE — Discharge Instructions (Signed)

## 2017-11-28 NOTE — Anesthesia Postprocedure Evaluation (Signed)
Anesthesia Post Note  Patient: Vanessa Berg  Procedure(s) Performed: ESOPHAGOGASTRODUODENOSCOPY (EGD) WITH PROPOFOL (N/A ) BIOPSY     Patient location during evaluation: PACU Anesthesia Type: General Level of consciousness: awake and alert Pain management: pain level controlled Vital Signs Assessment: post-procedure vital signs reviewed and stable Respiratory status: spontaneous breathing, nonlabored ventilation and respiratory function stable Cardiovascular status: blood pressure returned to baseline and stable Postop Assessment: no apparent nausea or vomiting Anesthetic complications: no    Last Vitals:  Vitals:   11/28/17 0950 11/28/17 1000  BP: 133/76 (!) 147/83  Pulse: 83 73  Resp: 17 17  Temp:    SpO2: 97% 99%    Last Pain:  Vitals:   11/28/17 0940  TempSrc: Oral  PainSc: 0-No pain                 Audry Pili

## 2017-11-28 NOTE — Anesthesia Procedure Notes (Signed)
Procedure Name: Intubation Date/Time: 11/28/2017 9:19 AM Performed by: Sharlette Dense, CRNA Patient Re-evaluated:Patient Re-evaluated prior to induction Oxygen Delivery Method: Circle system utilized Preoxygenation: Pre-oxygenation with 100% oxygen Induction Type: IV induction and Rapid sequence Laryngoscope Size: Miller and 2 Grade View: Grade I Tube type: Oral Tube size: 7.5 mm Number of attempts: 1 Airway Equipment and Method: Stylet Placement Confirmation: ETT inserted through vocal cords under direct vision,  positive ETCO2 and breath sounds checked- equal and bilateral Secured at: 21 cm Tube secured with: Tape Dental Injury: Teeth and Oropharynx as per pre-operative assessment

## 2017-11-28 NOTE — Op Note (Signed)
[facility] Patient Name: Vanessa Berg Procedure Date: 11/28/2017 MRN: [id number] Attending MD: Carol Ada , MD Date of Birth: [birth year] CSN: [id number] Age: 57 Admit Type: Outpatient Procedure:                Upper GI endoscopy Indications:              Heartburn, Chest pain (non cardiac) Providers:                Carol Ada, MD, Cleda Daub, RN, Elspeth Cho                            Tech., Technician, Danley Danker, CRNA Referring MD:              Medicines:                General Anesthesia Complications:            No immediate complications. Estimated Blood Loss:     Estimated blood loss was minimal. Procedure:                Pre-Anesthesia Assessment:                           - Prior to the procedure, a History and Physical                            was performed, and patient medications and                            allergies were reviewed. The patient's tolerance of                            previous anesthesia was also reviewed. The risks                            and benefits of the procedure and the sedation                            options and risks were discussed with the patient.                            All questions were answered, and informed consent                            was obtained. Prior Anticoagulants: The patient has                            taken no previous anticoagulant or antiplatelet                            agents. ASA Grade Assessment: III - A patient with                            severe systemic disease. After reviewing the risks  and benefits, the patient was deemed in                            satisfactory condition to undergo the procedure.                           - Sedation was administered by an anesthesia                            professional. Deep sedation was attained.                           After obtaining informed consent, the endoscope was                  passed under direct vision. Throughout the                            procedure, the patient's blood pressure, pulse, and                            oxygen saturations were monitored continuously. The                            GIF-H190 ([id number] Olympus adult endoscope was                            introduced through the mouth, and advanced to the                            second part of duodenum. The upper GI endoscopy was                            accomplished without difficulty. The patient                            tolerated the procedure well. Scope In: Scope Out: Findings:      There were esophageal mucosal changes classified as Barrett's stage       C0-M1 per Prague criteria present in the lower third of the esophagus.       The maximum longitudinal extent of these mucosal changes was 1 cm in       length. Mucosa was biopsied with a cold forceps for histology in a       targeted manner in the lower third of the esophagus. One specimen bottle       was sent to pathology.      Patchy, white plaques were found in the middle third of the esophagus       and in the lower third of the esophagus. Biopsies were taken with a cold       forceps for histology.      The stomach was normal.      The examined duodenum was normal. Impression:               - Esophageal mucosal changes classified as  Barrett's stage C0-M1 per Prague criteria. Biopsied.                           - Esophageal plaques were found, suspicious for                            candidiasis. Biopsied.                           - Normal stomach.                           - Normal examined duodenum. Moderate Sedation:      Not Applicable - Patient had care per Anesthesia. Recommendation:           - Patient has a contact number available for                            emergencies. The signs and symptoms of potential                            delayed complications were  discussed with the                            patient. Return to normal activities tomorrow.                            Written discharge instructions were provided to the                            patient.                           - Resume previous diet.                           - Continue present medications.                           - Await pathology results.                           - Repeat upper endoscopy in 5 years for                            surveillance.                           - Return to GI clinic in 4 weeks. Procedure Code(s):        --- Professional ---                           [phone], Esophagogastroduodenoscopy, flexible,                            transoral; with biopsy, single or multiple Diagnosis Code(s):        --- Professional ---  K22.70, Barrett's esophagus without dysplasia                           K22.9, Disease of esophagus, unspecified                           R12, Heartburn                           R07.89, Other chest pain CPT copyright 2018 American Medical Association. All rights reserved. The codes documented in this report are preliminary and upon coder review may  be revised to meet current compliance requirements. Carol Ada, MD Carol Ada, MD 11/28/2017 9:40:10 AM This report has been signed electronically. Number of Addenda: 0

## 2017-11-28 NOTE — Transfer of Care (Signed)
Immediate Anesthesia Transfer of Care Note  Patient: Vanessa Berg  Procedure(s) Performed: ESOPHAGOGASTRODUODENOSCOPY (EGD) WITH PROPOFOL (N/A ) BIOPSY  Patient Location: Endoscopy Unit  Anesthesia Type:General  Level of Consciousness: awake and alert   Airway & Oxygen Therapy: Patient Spontanous Breathing and Patient connected to face mask oxygen  Post-op Assessment: Report given to RN and Post -op Vital signs reviewed and stable  Post vital signs: Reviewed and stable  Last Vitals:  Vitals Value Taken Time  BP    Temp    Pulse    Resp    SpO2      Last Pain:  Vitals:   11/28/17 0841  PainSc: 0-No pain         Complications: No apparent anesthesia complications

## 2017-12-01 ENCOUNTER — Encounter (HOSPITAL_COMMUNITY): Payer: Self-pay | Admitting: Gastroenterology

## 2017-12-02 ENCOUNTER — Other Ambulatory Visit: Payer: Self-pay

## 2017-12-02 ENCOUNTER — Encounter (HOSPITAL_COMMUNITY): Payer: Self-pay

## 2017-12-02 ENCOUNTER — Ambulatory Visit (HOSPITAL_COMMUNITY)
Admission: EM | Admit: 2017-12-02 | Discharge: 2017-12-02 | Disposition: A | Discharge status: Home / Self Care | Payer: PRIVATE HEALTH INSURANCE | Attending: Family Medicine | Admitting: Family Medicine

## 2017-12-02 DIAGNOSIS — J040 Acute laryngitis: Secondary | ICD-10-CM | POA: Diagnosis not present

## 2017-12-02 MED ORDER — FLUTICASONE PROPIONATE 50 MCG/ACT NA SUSP: 2.0000 | Freq: Every day | NASAL | 0 refills | Status: DC

## 2017-12-02 MED ORDER — IPRATROPIUM BROMIDE 0.06 % NA SOLN: 2.0000 | Freq: Four times a day (QID) | NASAL | 0 refills | Status: DC

## 2017-12-02 NOTE — ED Triage Notes (Signed)
Pt cc states she had a biopsy on her throat last Friday and now she has lost her voice.

## 2017-12-02 NOTE — Discharge Instructions (Signed)
As discussed, this could also be due to viral illness. Start flonase and atrovent for possible post nasal drainage. Keep hydrated, your urine should be clear to pale yellow in color. Please also call your GI doctor to discuss current symptoms.   For sore throat/cough try using a honey-based tea. Use 3 teaspoons of honey with juice squeezed from half lemon. Place shaved pieces of ginger into 1/2-1 cup of water and warm over stove top. Then mix the ingredients and repeat every 4 hours as needed.
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
## 2017-12-18 ENCOUNTER — Other Ambulatory Visit: Payer: Self-pay | Admitting: Allergy and Immunology

## 2017-12-31 ENCOUNTER — Ambulatory Visit (HOSPITAL_COMMUNITY)
Admission: EM | Admit: 2017-12-31 | Discharge: 2017-12-31 | Disposition: A | Discharge status: Home / Self Care | Payer: PRIVATE HEALTH INSURANCE | Attending: Family Medicine | Admitting: Family Medicine

## 2017-12-31 ENCOUNTER — Other Ambulatory Visit: Payer: Self-pay

## 2017-12-31 ENCOUNTER — Encounter (HOSPITAL_COMMUNITY): Payer: Self-pay | Admitting: Emergency Medicine

## 2017-12-31 DIAGNOSIS — R030 Elevated blood-pressure reading, without diagnosis of hypertension: Secondary | ICD-10-CM

## 2017-12-31 DIAGNOSIS — J0100 Acute maxillary sinusitis, unspecified: Secondary | ICD-10-CM

## 2017-12-31 MED ORDER — DOXYCYCLINE HYCLATE 100 MG PO CAPS: 100.0000 mg | ORAL_CAPSULE | Freq: Two times a day (BID) | ORAL | 0 refills | Status: DC

## 2017-12-31 NOTE — Discharge Instructions (Addendum)
Rest and push fluids Continue with flonase.   Doxycycline prescribed.  Take as directed and to completion Continue with OTC ibuprofen/tylenol as needed for pain Follow up with PCP if symptoms persists Return or go to the ED if you have any new or worsening symptoms such as fever, chills, worsening sinus pain/pressure, cough, sore throat, chest pain, shortness of breath, abdominal pain, changes in bowel or bladder habits, etc...   Blood pressure elevated in office.  Please recheck in 24 hours.  If it continues to be greater than 140/90 please follow up with PCP for further evaluation and management.

## 2017-12-31 NOTE — ED Triage Notes (Signed)
Pt reports facial pressure, right ear pain and cough x4 days.
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
## 2018-01-24 ENCOUNTER — Other Ambulatory Visit: Payer: Self-pay | Admitting: Allergy and Immunology

## 2018-02-02 ENCOUNTER — Ambulatory Visit (INDEPENDENT_AMBULATORY_CARE_PROVIDER_SITE_OTHER): Payer: PRIVATE HEALTH INSURANCE | Admitting: Orthopaedic Surgery

## 2018-02-02 ENCOUNTER — Encounter (INDEPENDENT_AMBULATORY_CARE_PROVIDER_SITE_OTHER): Payer: Self-pay | Admitting: Orthopaedic Surgery

## 2018-02-02 VITALS — BP 141/98 | HR 68 | Ht 61.0 in | Wt 225.0 lb

## 2018-02-02 DIAGNOSIS — M25511 Pain in right shoulder: Secondary | ICD-10-CM

## 2018-02-02 MED ORDER — METHYLPREDNISOLONE ACETATE 40 MG/ML IJ SUSP
40.0000 mg | INTRAMUSCULAR | Status: AC | PRN
  Administered 2018-02-02: 40 mg via INTRAMUSCULAR

## 2018-02-02 MED ORDER — LIDOCAINE HCL 2 % IJ SOLN
2.0000 mL | INTRAMUSCULAR | Status: AC | PRN
  Administered 2018-02-02: 2 mL

## 2018-02-02 MED ORDER — BUPIVACAINE HCL 0.5 % IJ SOLN
2.0000 mL | INTRAMUSCULAR | Status: AC | PRN
  Administered 2018-02-02: 2 mL
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
## 2018-02-02 NOTE — Progress Notes (Deleted)
Rt shoulder blade--no injury, pain , tightness for about 2 months. Tried heat niot helping.
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
## 2018-02-17 ENCOUNTER — Other Ambulatory Visit: Payer: Self-pay | Admitting: Internal Medicine

## 2018-02-17 DIAGNOSIS — Z1231 Encounter for screening mammogram for malignant neoplasm of breast: Secondary | ICD-10-CM

## 2018-03-26 ENCOUNTER — Ambulatory Visit: Payer: PRIVATE HEALTH INSURANCE | Admitting: Neurology

## 2018-03-31 ENCOUNTER — Encounter: Payer: Self-pay | Admitting: Neurology

## 2018-04-02 ENCOUNTER — Ambulatory Visit: Payer: PRIVATE HEALTH INSURANCE

## 2018-04-21 IMAGING — MR MR HEAD WO/W CM
12 series · 48 of 48 positions shown · IV contrast (multihance)
Comparison: Head CT 04/01/2011 and MRI 06/21/2008

CLINICAL DATA: Headaches, slurred speech, and dizziness for 4
months.

Creatinine was obtained on site at [HOSPITAL] at [HOSPITAL].
Results: Creatinine 0.7 mg/dL.
EXAM:
MRI HEAD WITHOUT AND WITH CONTRAST
TECHNIQUE: Multiplanar, multiecho pulse sequences of the brain and surrounding
structures were obtained without and with intravenous contrast.
CONTRAST:  20mL MULTIHANCE GADOBENATE DIMEGLUMINE 529 MG/ML IV SOLN

[Series 3: t1_se_sag · sagittal · 5.0mm · 0.45mm/px · 1 of 21 slices shown]
[im 1/21]
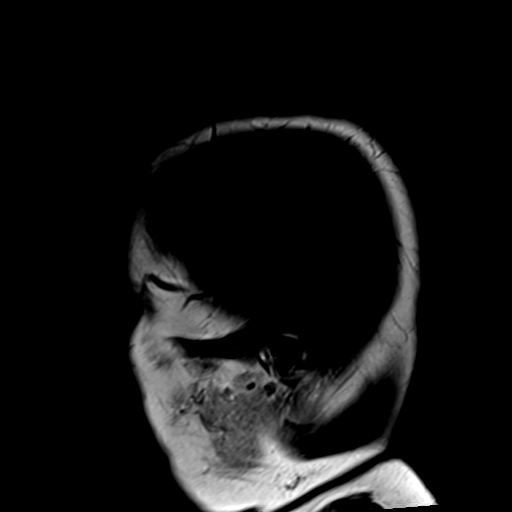

[Series 4: t2_tse_tra_512 · axial · 5.0mm · 0.60mm/px · 1 of 24 slices shown]
[im 1/24]
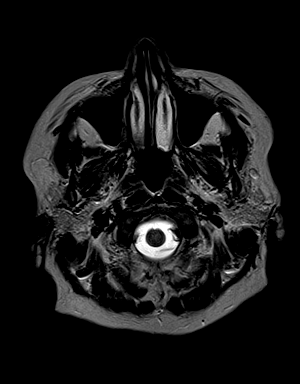

[Series 5: ep2d_diff_(id)_trace · axial · 3.0mm · 1.80mm/px · z∈[-41,+99]mm · 7 of 96 slices shown]
[im 1/96]
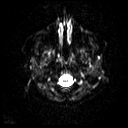
[im 16/96]
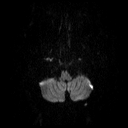
[im 32/96]
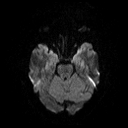
[im 48/96]
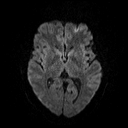
[im 64/96]
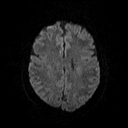
[im 80/96]
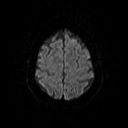
[im 96/96]
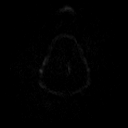

[Series 6: ep2d_diff_(id)_trace_adc · axial · 3.0mm · 1.80mm/px · z∈[-41,+99]mm · 3 of 48 slices shown]
[im 1/48]
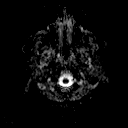
[im 24/48]
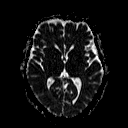
[im 48/48]
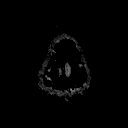

[Series 7: ep2d_diff_cor · coronal · 5.0mm · 1.77mm/px · 3 of 51 slices shown]
[im 1/51]
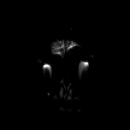
[im 26/51]
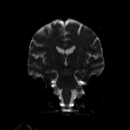
[im 51/51]
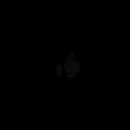

[Series 8: ep2d_diff_cor_adc · coronal · 5.0mm · 1.77mm/px · 2 of 26 slices shown]
[im 1/26]
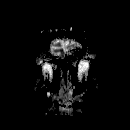
[im 26/26]
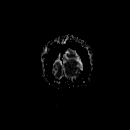

[Series 10: swi_images · axial · 2.0mm · 0.90mm/px · z∈[-41,+100]mm · 5 of 72 slices shown]
[im 1/72]
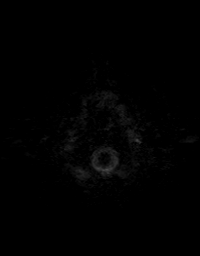
[im 18/72]
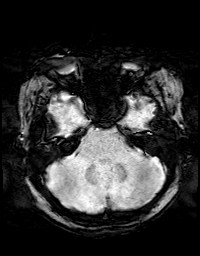
[im 36/72]
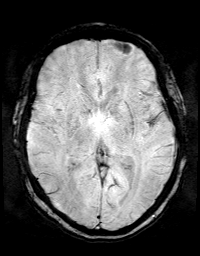
[im 54/72]
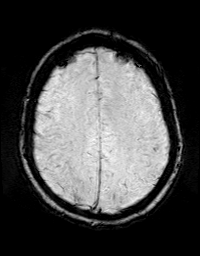
[im 72/72]
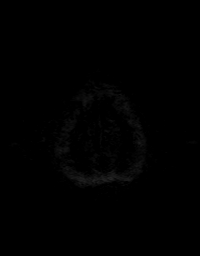

[Series 11: FLAIR · axial · 3.0mm · 0.43mm/px · z∈[-43,+100]mm · 2 of 30 slices shown]
[im 1/30]
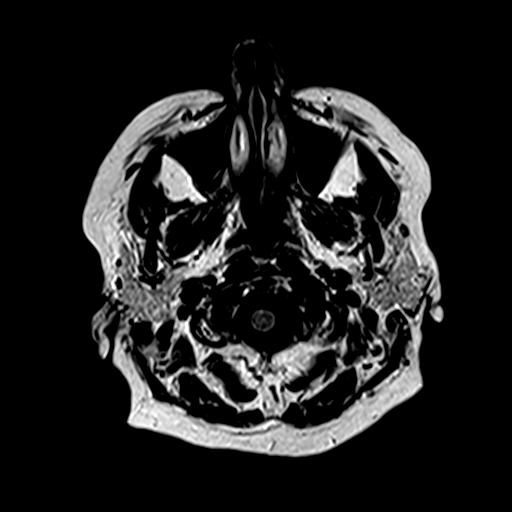
[im 30/30]
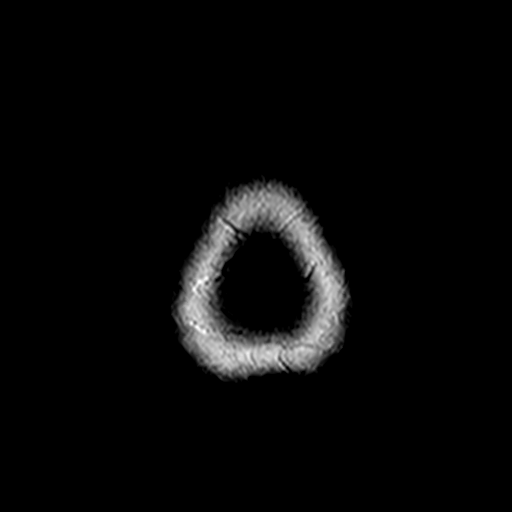

[Series 12: t1_mpr_tra · axial · 1.0mm · 0.72mm/px · z∈[-45,+104]mm · 10 of 144 slices shown]
[im 1/144]
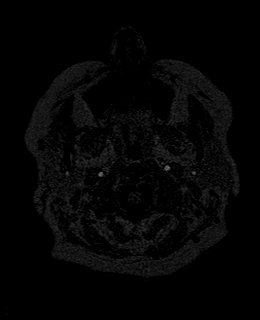
[im 16/144]
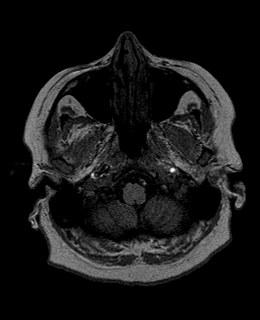
[im 32/144]
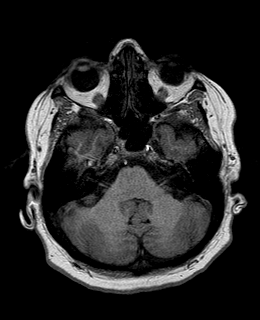
[im 48/144]
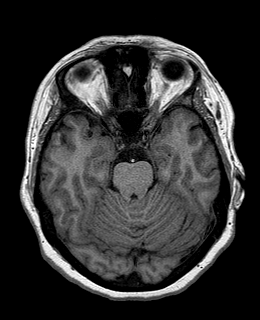
[im 64/144]
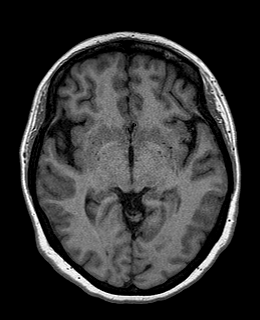
[im 80/144]
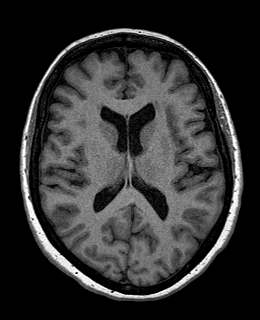
[im 96/144]
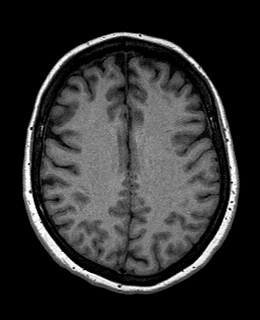
[im 112/144]
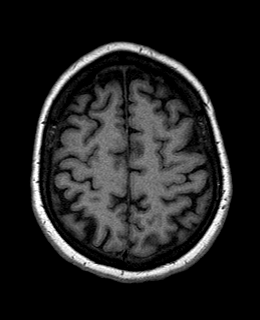
[im 128/144]
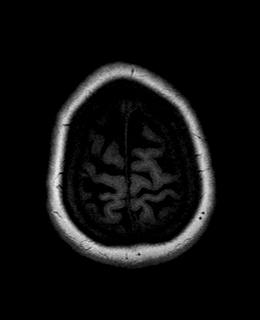
[im 144/144]
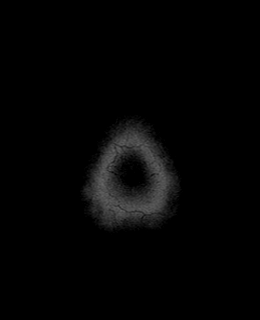

[Series 13: T2 · coronal · 5.0mm · 0.45mm/px · 2 of 26 slices shown]
[im 1/26]
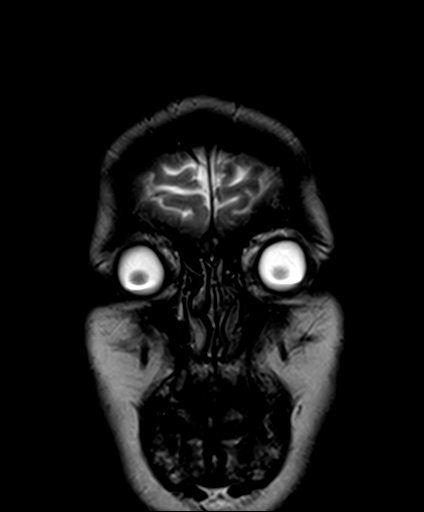
[im 26/26]
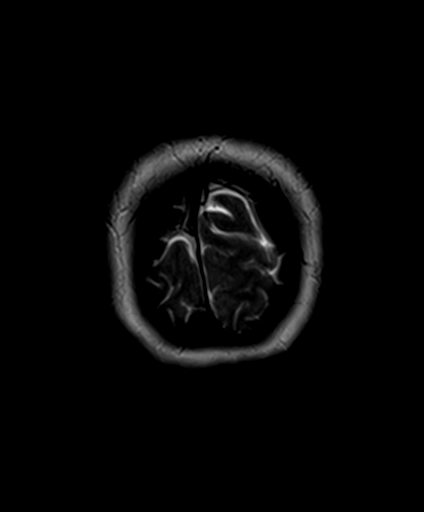

[Series 14: post t1_mpr_tra · axial · 1.0mm · 0.72mm/px · z∈[-45,+104]mm · 10 of 144 slices shown]
[im 1/144]
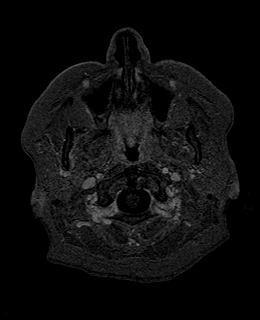
[im 16/144]
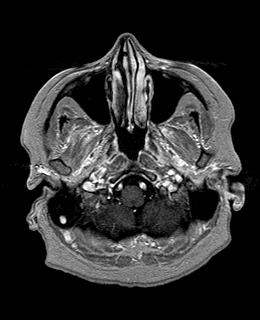
[im 32/144]
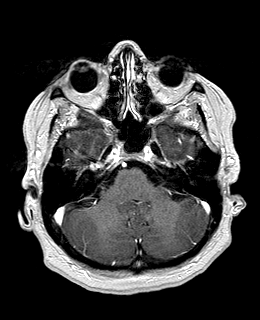
[im 48/144]
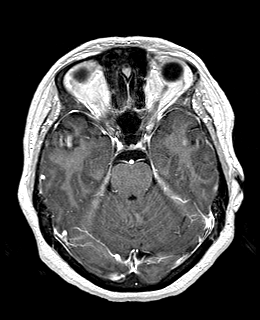
[im 64/144]
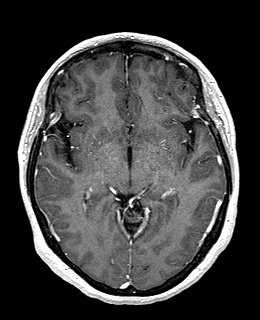
[im 80/144]
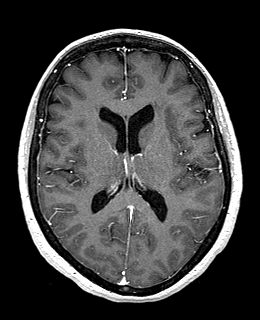
[im 96/144]
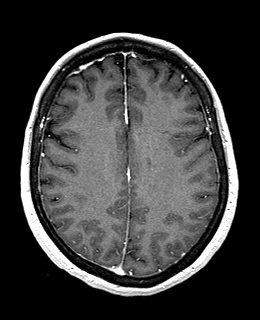
[im 112/144]
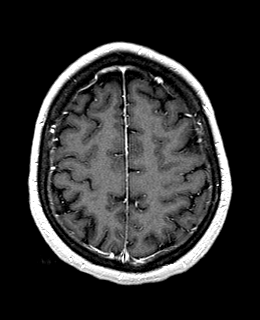
[im 128/144]
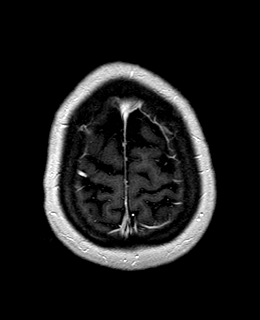
[im 144/144]
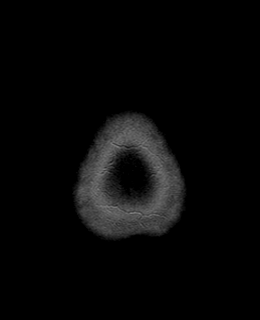

[Series 15: T1 post-contrast · coronal · 5.0mm · 0.45mm/px · 2 of 26 slices shown]
[im 1/26]
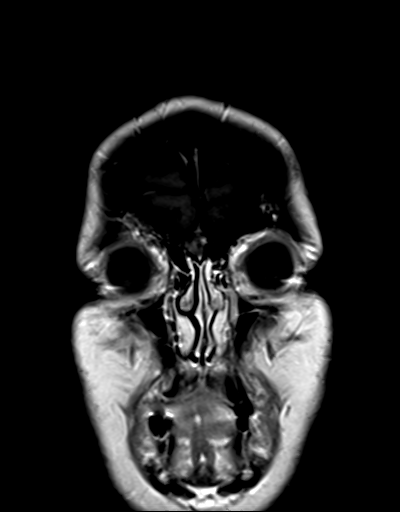
[im 26/26]
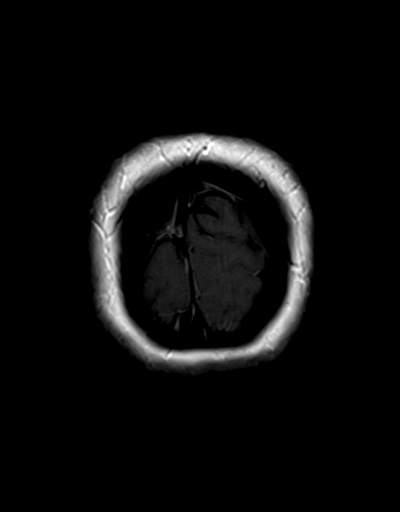

[48 of 48 positions shown; findings below may reference images not displayed]

FINDINGS: Brain: There is no evidence of acute infarct, intracranial
hemorrhage, mass, midline shift, or extra-axial fluid collection.
Partially empty sella is unchanged from the prior MRI. The
ventricles and sulci are normal. A few punctate foci of T2
hyperintensity in the cerebral white matter are considered to be
within normal limits for patient's age, only minimally increased
from 0858. No abnormal enhancement is identified.

Vascular: Major intracranial vascular flow voids are preserved.

Skull and upper cervical spine: Unremarkable bone marrow signal.

Sinuses/Orbits: Unremarkable orbits. Paranasal sinuses and mastoid
air cells are clear.

Other: None.
IMPRESSION: 1. No acute abnormality or cause of the patient's symptoms
identified.
2. Unchanged partially empty sella, otherwise unremarkable
appearance of the brain for age.

## 2018-04-27 ENCOUNTER — Ambulatory Visit: Payer: PRIVATE HEALTH INSURANCE

## 2018-05-02 ENCOUNTER — Encounter: Payer: Self-pay | Admitting: *Deleted

## 2018-05-19 ENCOUNTER — Ambulatory Visit: Payer: PRIVATE HEALTH INSURANCE | Admitting: Neurology

## 2018-05-25 ENCOUNTER — Ambulatory Visit: Payer: PRIVATE HEALTH INSURANCE

## 2018-07-02 ENCOUNTER — Ambulatory Visit
Admission: RE | Admit: 2018-07-02 | Discharge: 2018-07-02 | Disposition: A | Discharge status: Home / Self Care | Payer: PRIVATE HEALTH INSURANCE | Source: Ambulatory Visit | Attending: Internal Medicine | Admitting: Internal Medicine

## 2018-07-02 ENCOUNTER — Other Ambulatory Visit: Payer: Self-pay | Admitting: Internal Medicine

## 2018-07-02 ENCOUNTER — Other Ambulatory Visit: Payer: Self-pay

## 2018-07-02 DIAGNOSIS — N644 Mastodynia: Secondary | ICD-10-CM

## 2018-07-02 DIAGNOSIS — R52 Pain, unspecified: Secondary | ICD-10-CM

## 2018-07-06 ENCOUNTER — Other Ambulatory Visit: Payer: Self-pay | Admitting: Internal Medicine

## 2018-07-06 DIAGNOSIS — Z803 Family history of malignant neoplasm of breast: Secondary | ICD-10-CM

## 2018-07-06 DIAGNOSIS — R928 Other abnormal and inconclusive findings on diagnostic imaging of breast: Secondary | ICD-10-CM

## 2018-07-07 ENCOUNTER — Ambulatory Visit
Admission: RE | Admit: 2018-07-07 | Discharge: 2018-07-07 | Disposition: A | Discharge status: Home / Self Care | Payer: PRIVATE HEALTH INSURANCE | Source: Ambulatory Visit | Attending: Internal Medicine | Admitting: Internal Medicine

## 2018-07-07 ENCOUNTER — Other Ambulatory Visit: Payer: Self-pay

## 2018-07-07 DIAGNOSIS — R928 Other abnormal and inconclusive findings on diagnostic imaging of breast: Secondary | ICD-10-CM

## 2018-07-07 DIAGNOSIS — Z803 Family history of malignant neoplasm of breast: Secondary | ICD-10-CM

## 2018-07-07 MED ORDER — GADOBUTROL 1 MMOL/ML IV SOLN
10.0000 mL | Freq: Once | INTRAVENOUS | Status: AC | PRN
  Administered 2018-07-07: 10 mL via INTRAVENOUS

## 2018-09-18 ENCOUNTER — Telehealth: Payer: Self-pay | Admitting: Licensed Clinical Social Worker

## 2018-09-18 NOTE — Telephone Encounter (Signed)
Called patient regarding upcoming [facility] appointment, left a voicemail. This will be a walk-in visit due to no confirmation to set this up as virtual.

## 2018-09-21 ENCOUNTER — Other Ambulatory Visit: Payer: Self-pay

## 2018-09-21 ENCOUNTER — Inpatient Hospital Stay: Payer: PRIVATE HEALTH INSURANCE

## 2018-09-21 ENCOUNTER — Inpatient Hospital Stay: Payer: PRIVATE HEALTH INSURANCE | Attending: Oncology | Admitting: Licensed Clinical Social Worker

## 2018-09-21 ENCOUNTER — Encounter: Payer: Self-pay | Admitting: Licensed Clinical Social Worker

## 2018-09-21 DIAGNOSIS — Z801 Family history of malignant neoplasm of trachea, bronchus and lung: Secondary | ICD-10-CM

## 2018-09-21 DIAGNOSIS — Z803 Family history of malignant neoplasm of breast: Secondary | ICD-10-CM

## 2018-09-21 DIAGNOSIS — Z8051 Family history of malignant neoplasm of kidney: Secondary | ICD-10-CM | POA: Insufficient documentation

## 2018-09-21 DIAGNOSIS — Z8042 Family history of malignant neoplasm of prostate: Secondary | ICD-10-CM

## 2018-09-21 DIAGNOSIS — Z800 Family history of malignant neoplasm of digestive organs: Secondary | ICD-10-CM | POA: Insufficient documentation
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
## 2018-10-02 ENCOUNTER — Telehealth: Payer: Self-pay | Admitting: Licensed Clinical Social Worker

## 2018-10-13 ENCOUNTER — Encounter: Payer: Self-pay | Admitting: Licensed Clinical Social Worker

## 2018-10-13 ENCOUNTER — Ambulatory Visit: Payer: Self-pay | Admitting: Licensed Clinical Social Worker

## 2018-10-13 ENCOUNTER — Telehealth: Payer: Self-pay | Admitting: Licensed Clinical Social Worker

## 2018-10-13 DIAGNOSIS — Z1379 Encounter for other screening for genetic and chromosomal anomalies: Secondary | ICD-10-CM

## 2018-10-13 DIAGNOSIS — Z8051 Family history of malignant neoplasm of kidney: Secondary | ICD-10-CM

## 2018-10-13 DIAGNOSIS — Z800 Family history of malignant neoplasm of digestive organs: Secondary | ICD-10-CM

## 2018-10-13 DIAGNOSIS — Z8042 Family history of malignant neoplasm of prostate: Secondary | ICD-10-CM

## 2018-10-13 DIAGNOSIS — Z801 Family history of malignant neoplasm of trachea, bronchus and lung: Secondary | ICD-10-CM

## 2018-10-13 DIAGNOSIS — Z803 Family history of malignant neoplasm of breast: Secondary | ICD-10-CM

## 2018-10-13 NOTE — Telephone Encounter (Signed)
Lft the pt a vm to schedule an appt in the high risk breast clinic

## 2018-10-13 NOTE — Progress Notes (Signed)
HPI:  [name] was previously seen in the [place] clinic due to a family history of cancer and concerns regarding a hereditary predisposition to cancer. Please refer to our prior cancer genetics clinic note for more information regarding our discussion, assessment and recommendations, at the time. Vanessa Berg's recent genetic test results were disclosed to her, as were recommendations warranted by these results. These results and recommendations are discussed in more detail below.  CANCER HISTORY:  Oncology History   No history exists.    FAMILY HISTORY:  We obtained a detailed, 4-generation family history.  Significant diagnoses are listed below: Family History  Problem Relation Age of Onset   Heart disease Mother 51   Emphysema Mother    Diabetes Mother    Hypertension Mother    Cancer Mother 98       lung   Thyroid disease Mother    Breast cancer Mother 66   Stomach cancer Mother 76   Cancer Father 58       died of esophageal cancer   Gout Father    Heart disease Sister 14       MI age 91   Hypertension Brother    Hypertension Sister    Parkinsonism Paternal Uncle    Heart disease Maternal Grandmother    Gout Maternal Grandmother    Breast cancer Maternal Grandmother 19   Heart disease Maternal Grandfather    Stroke Maternal Grandfather    Alzheimer's disease Paternal Grandmother    Heart disease Paternal Grandmother    Breast cancer Paternal Grandmother 80   Cancer Paternal Grandfather        prostate   Heart disease Paternal Grandfather    Breast cancer Maternal Aunt 40       dx again 54   Pancreatic cancer Maternal Uncle    Breast cancer Maternal Aunt 60   Lung cancer Maternal Uncle    Kidney cancer Maternal Uncle    Vanessa Berg has one son, 38, and a daughter, 40. She has one full brother, 68, who has not had cancer. She also has 3 maternal half sisters, no cancers.   Vanessa Berg's  mother was diagnosed with breast cancer at 27, lung cancer at 19, and stomach cancer at 69. She passed at 43. Patient had 3 maternal aunts, 7 maternal uncles. One of her aunts had breast cancer at 42, cervical cancer, and breast cancer recently diagnosed again at 32. Another aunt had breast cancer at 4 and is living at 44. Neither have had genetic testing. An uncle had pancreatic cancer, another uncle had kidney cancer, and a third uncle had lung cancer. One maternal cousin had leukemia. Patient's maternal grandmother had breast cancer at 52 and died at 72. Her maternal grandfather died at 15.  Vanessa Berg's father died of esophageal cancer at 73. Patient had 3 paternal aunts, 3 paternal uncles. She has limited information about them. She believes some of them may have had cancer but is unsure what types. No known cancers in paternal cousins. Her paternal grandmother had breast cancer at 29 and died at 5. Paternal grandfather had prostate and lung cancer and died in his 25s.  Vanessa Berg is unaware of previous family history of genetic testing for hereditary cancer risks. Patient's maternal ancestors are of [place] descent, and paternal ancestors are of [place] and European descent. There is no reported Ashkenazi Jewish ancestry. There is no known consanguinity.  GENETIC TEST RESULTS: Genetic testing reported out on 10/01/2018  through the [facility] Common Hereditary Cancer Panel + Rena/Urinary tract cancer panel found no pathogenic mutations.The [facility] Common Hereditary Cancers Panel + Renal/Urinary Tract Cancers Panel. These panels analyzed the following 59 genes: APC, ATM, AXIN2, BAP1, BARD1, BMPR1A, BRCA1, BRCA2, BRIP1, CDC73, CDH1, CDK4, CDKN1C, CDKN2A (p14ARF), CDKN2A (p16INK4a), CHEK2, CTNNA1, DICER1, DIS3L2, EPCAM*, FH, FLCN, GPC3, GREM1*, HOXB13, KIT, MEN1, MET, MLH1, MSH2, MSH3, MSH6, MUTYH, NBN, NF1, NTHL1, PALB2, PDGFRA, PMS2, POLD1, POLE, PTEN, RAD50, RAD51C, RAD51D, REST,  RNF43, SDHA*, SDHB, SDHC, SDHD, SMAD4, SMARCA4, SMARCB1, STK11, TP53, TSC1, TSC2, VHL, WT1.  The test report has been scanned into EPIC and is located under the Molecular Pathology section of the Results Review tab.  A portion of the result report is included below for reference.     We discussed with Vanessa Berg that because current genetic testing is not perfect, it is possible there may be a gene mutation in one of these genes that current testing cannot detect, but that chance is small.  We also discussed, that there could be another gene that has not yet been discovered, or that we have not yet tested, that is responsible for the cancer diagnoses in the family. It is also possible there is a hereditary cause for the cancer in the family that Vanessa Berg did not inherit and therefore was not identified in her testing.  Therefore, it is important to remain in touch with cancer genetics in the future so that we can continue to offer Vanessa Berg the most up to date genetic testing.   Genetic testing did identify a variant of uncertain significance (VUS) was identified in the PMS2 gene called [id number].  At this time, it is unknown if this variant is associated with increased cancer risk or if this is a normal finding, but most variants such as this get reclassified to being inconsequential. It should not be used to make medical management decisions. With time, we suspect the lab will determine the significance of this variant, if any. If we do learn more about it, we will try to contact Vanessa Berg to discuss it further. However, it is important to stay in touch with [place] periodically and keep the address and phone number up to date.  ADDITIONAL GENETIC TESTING: We discussed with Vanessa Berg that her genetic testing was fairly extensive.  If there are genes identified to increase cancer risk that can be analyzed in the future, we would be happy to discuss and coordinate this  testing at that time.    CANCER SCREENING RECOMMENDATIONS: Vanessa Berg's test result is considered negative (normal).  This means that we have not identified a hereditary cause for her family history of cancer at this time.    While reassuring, this does not definitively rule out a hereditary predisposition to cancer. It is still possible that there could be genetic mutations that are undetectable by current technology. There could be genetic mutations in genes that have not been tested or identified to increase cancer risk.  Therefore, it is recommended she continue to follow the cancer management and screening guidelines provided by her primary healthcare provider.   Based on the patient's family history, a statistical model [employer]) was used to estimate her risk of developing breast cancer. This estimates her lifetime risk of developing breast cancer to be approximately 27%. This estimation takes into account her negative genetic test results.  The patient's lifetime breast cancer risk is a preliminary estimate based on available information using one of several models  endorsed by the [place] (ACS). The ACS recommends consideration of breast MRI screening as an adjunct to mammography for patients at high risk (defined as 20% or greater lifetime risk).     Vanessa Berg has been determined to be at high risk for breast cancer. Therefore, we recommend that annual screening with mammography and breast MRI be performed. She notes her doctor has already referred her to our high risk clinic here. She has not yet received a call for this appointment and requested that I check on this.  An individual's cancer risk and medical management are not determined by genetic test results alone. Overall cancer risk assessment incorporates additional factors, including personal medical history, family history, and any available genetic information that may result in a personalized plan  for cancer prevention and surveillance.  RECOMMENDATIONS FOR FAMILY MEMBERS:  Relatives in this family might be at some increased risk of developing cancer, over the general population risk, simply due to the family history of cancer.  We recommended female relatives in this family have a yearly mammogram beginning at age 26, or 31 years younger than the earliest onset of cancer, an annual clinical breast exam, and perform monthly breast self-exams. Female relatives in this family should also have a gynecological exam as recommended by their primary provider. All family members should have a colonoscopy by age 70, or as directed by their physicians.   It is also possible there is a hereditary cause for the cancer in Vanessa Berg's family that she did not inherit and therefore was not identified in her Based on Vanessa Berg's family history, we recommended her maternal relatives have genetic counseling and testing. Vanessa Berg will let us know if we can be of any assistance in coordinating genetic counseling and/or testing for these family members.  FOLLOW-UP: Lastly, we discussed with Vanessa Berg that cancer genetics is a rapidly advancing field and it is possible that new genetic tests will be appropriate for her and/or her family members in the future. We encouraged her to remain in contact with cancer genetics on an annual basis so we can update her personal and family histories and let her know of advances in cancer genetics that may benefit this family.   Our contact number was provided. Vanessa Berg's questions were answered to her satisfaction, and she knows she is welcome to call us at anytime with additional questions or concerns.   Faith Rogue, MS, [facility] Genetic Counselor [email or web address] Phone: [phone]

## 2018-10-13 NOTE — Telephone Encounter (Signed)
Revealed negative genetic testing.  Revealed that a VUS in PMS2 was identified.  We discussed that we do not know why there is cancer in the family. It could be due to a different gene that we are not testing, or something our current technology cannot pick up.  It will be important for her to keep in contact with genetics to learn if additional testing may be needed in the future. She also notes that she has not been called yet about a high risk appointment.

## 2018-10-14 ENCOUNTER — Telehealth: Payer: Self-pay | Admitting: Hematology and Oncology

## 2018-10-14 NOTE — Telephone Encounter (Signed)
A new high risk appt has been scheduled for Mr. Deniece Ree to see Dr. Lindi Adie on 10/1  1pm. She's aware to arrive 20 minutes early.

## 2018-10-15 ENCOUNTER — Encounter: Payer: Self-pay | Admitting: Orthopaedic Surgery

## 2018-10-15 ENCOUNTER — Ambulatory Visit (INDEPENDENT_AMBULATORY_CARE_PROVIDER_SITE_OTHER): Payer: PRIVATE HEALTH INSURANCE

## 2018-10-15 ENCOUNTER — Ambulatory Visit: Payer: PRIVATE HEALTH INSURANCE | Admitting: Orthopaedic Surgery

## 2018-10-15 ENCOUNTER — Other Ambulatory Visit: Payer: Self-pay

## 2018-10-15 VITALS — BP 135/82 | HR 77 | Ht 61.0 in | Wt 215.0 lb

## 2018-10-15 DIAGNOSIS — M25551 Pain in right hip: Secondary | ICD-10-CM

## 2018-10-15 DIAGNOSIS — G8929 Other chronic pain: Secondary | ICD-10-CM

## 2018-10-15 DIAGNOSIS — M545 Low back pain: Secondary | ICD-10-CM
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
## 2018-10-29 ENCOUNTER — Other Ambulatory Visit: Payer: Self-pay

## 2018-10-29 ENCOUNTER — Inpatient Hospital Stay: Payer: PRIVATE HEALTH INSURANCE | Attending: Oncology | Admitting: Hematology and Oncology

## 2018-10-29 DIAGNOSIS — Z9189 Other specified personal risk factors, not elsewhere classified: Secondary | ICD-10-CM | POA: Insufficient documentation

## 2018-10-29 DIAGNOSIS — Z803 Family history of malignant neoplasm of breast: Secondary | ICD-10-CM | POA: Insufficient documentation

## 2018-10-29 DIAGNOSIS — R208 Other disturbances of skin sensation: Secondary | ICD-10-CM | POA: Insufficient documentation

## 2018-10-29 DIAGNOSIS — Z79899 Other long term (current) drug therapy: Secondary | ICD-10-CM | POA: Insufficient documentation

## 2018-10-29 MED ORDER — PANTOPRAZOLE SODIUM 40 MG PO TBEC: 40.0000 mg | DELAYED_RELEASE_TABLET | Freq: Every day | ORAL | Status: DC

## 2018-10-29 MED ORDER — TAMOXIFEN CITRATE 20 MG PO TABS: 20.0000 mg | ORAL_TABLET | Freq: Every day | ORAL | 3 refills | Status: DC

## 2018-10-29 NOTE — Assessment & Plan Note (Signed)
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 We discussed the risks and benefits of tamoxifen. These include but not limited to insomnia, hot flashes, mood changes, vaginal dryness, and weight gain. Although rare, serious side effects including endometrial cancer, risk of blood clots were also discussed. We strongly believe that the benefits far outweigh the risks. Patient understands these risks and consented to starting treatment. Planned treatment duration is 5 years.  Breast cancer surveillance: 1.  Mammograms June 2020 2.  Breast MRIs will be planned for December 2021  Burning discomfort in the left breast: Currently on gabapentin.  I encouraged her to increase it to 600 mg daily.  If that does not work then we may switch her to Cymbalta.  Return to clinic in 3 months to assess tolerability to tamoxifen.

## 2018-10-30 ENCOUNTER — Telehealth: Payer: Self-pay | Admitting: Hematology and Oncology

## 2018-10-30 NOTE — Telephone Encounter (Signed)
I left a message regarding schedule  

## 2018-11-19 ENCOUNTER — Other Ambulatory Visit: Payer: Self-pay | Admitting: Gastroenterology

## 2018-11-19 DIAGNOSIS — R1084 Generalized abdominal pain: Secondary | ICD-10-CM

## 2018-11-19 DIAGNOSIS — R112 Nausea with vomiting, unspecified: Secondary | ICD-10-CM

## 2018-11-19 DIAGNOSIS — R634 Abnormal weight loss: Secondary | ICD-10-CM

## 2018-11-20 ENCOUNTER — Other Ambulatory Visit: Payer: Self-pay

## 2018-11-20 ENCOUNTER — Ambulatory Visit
Admission: RE | Admit: 2018-11-20 | Discharge: 2018-11-20 | Disposition: A | Discharge status: Home / Self Care | Payer: PRIVATE HEALTH INSURANCE | Source: Ambulatory Visit | Attending: Gastroenterology | Admitting: Gastroenterology

## 2018-11-20 DIAGNOSIS — R634 Abnormal weight loss: Secondary | ICD-10-CM

## 2018-11-20 DIAGNOSIS — R1084 Generalized abdominal pain: Secondary | ICD-10-CM

## 2018-11-20 DIAGNOSIS — R112 Nausea with vomiting, unspecified: Secondary | ICD-10-CM

## 2018-11-20 MED ORDER — IOPAMIDOL (ISOVUE-300) INJECTION 61%
100.0000 mL | Freq: Once | INTRAVENOUS | Status: AC | PRN
  Administered 2018-11-20: 100 mL via INTRAVENOUS

## 2018-12-09 ENCOUNTER — Other Ambulatory Visit (HOSPITAL_COMMUNITY): Payer: Self-pay | Admitting: Gastroenterology

## 2018-12-09 DIAGNOSIS — R109 Unspecified abdominal pain: Secondary | ICD-10-CM

## 2018-12-09 DIAGNOSIS — R112 Nausea with vomiting, unspecified: Secondary | ICD-10-CM

## 2018-12-25 ENCOUNTER — Other Ambulatory Visit: Payer: Self-pay

## 2018-12-25 ENCOUNTER — Ambulatory Visit (HOSPITAL_COMMUNITY)
Admission: RE | Admit: 2018-12-25 | Discharge: 2018-12-25 | Disposition: A | Discharge status: Home / Self Care | Payer: PRIVATE HEALTH INSURANCE | Source: Ambulatory Visit | Attending: Gastroenterology | Admitting: Gastroenterology

## 2018-12-25 DIAGNOSIS — R109 Unspecified abdominal pain: Secondary | ICD-10-CM | POA: Insufficient documentation

## 2018-12-25 DIAGNOSIS — R112 Nausea with vomiting, unspecified: Secondary | ICD-10-CM | POA: Diagnosis not present

## 2018-12-25 MED ORDER — TECHNETIUM TC 99M SULFUR COLLOID
2.0000 | Freq: Once | INTRAVENOUS | Status: AC | PRN
  Administered 2018-12-25: 07:00:00 2 via ORAL

## 2018-12-29 ENCOUNTER — Other Ambulatory Visit: Payer: Self-pay

## 2018-12-29 ENCOUNTER — Encounter: Payer: Self-pay | Admitting: Neurology

## 2018-12-29 ENCOUNTER — Ambulatory Visit: Payer: PRIVATE HEALTH INSURANCE | Admitting: Neurology

## 2018-12-29 VITALS — BP 109/70 | HR 71 | Temp 97.7°F | Ht 61.0 in | Wt 218.2 lb

## 2018-12-29 DIAGNOSIS — Z789 Other specified health status: Secondary | ICD-10-CM | POA: Diagnosis not present

## 2018-12-29 DIAGNOSIS — Z9989 Dependence on other enabling machines and devices: Secondary | ICD-10-CM

## 2018-12-29 DIAGNOSIS — G4733 Obstructive sleep apnea (adult) (pediatric): Secondary | ICD-10-CM
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
## 2018-12-29 NOTE — Patient Instructions (Addendum)
Please use your CPAP regularly. As discussed, we will reduce your pressure from 14 cm to 11 cm at this time. While your insurance requires that you use CPAP at least 4 hours each night on 70% of the nights, I recommend, that you not skip any nights and use it throughout the night if you can. Getting used to CPAP and staying with the treatment long term does take time and patience and discipline. Untreated obstructive sleep apnea when it is moderate to severe can have an adverse impact on cardiovascular health and raise her risk for heart disease, arrhythmias, hypertension, congestive heart failure, stroke and diabetes. Untreated obstructive sleep apnea causes sleep disruption, nonrestorative sleep, and sleep deprivation. This can have an impact on your day to day functioning and cause daytime sleepiness and impairment of cognitive function, memory loss, mood disturbance, and problems focussing. Using CPAP regularly can improve these symptoms.  Please talk to your primary care physician about getting all your electrolytes checked again.  Please try to hydrate well with water, try some Gatorade, try magnesium over-the-counter.  Follow-up with your GI specialist.  Follow-up with our nurse practitioner in for sleep apnea checkup in 6 months.

## 2019-01-14 ENCOUNTER — Ambulatory Visit: Payer: PRIVATE HEALTH INSURANCE | Admitting: Neurology

## 2019-02-01 ENCOUNTER — Ambulatory Visit: Payer: PRIVATE HEALTH INSURANCE | Admitting: Hematology and Oncology

## 2019-02-01 NOTE — Assessment & Plan Note (Deleted)
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 We discussed the risks and benefits of tamoxifen. These include but not limited to insomnia, hot flashes, mood changes, vaginal dryness, and weight gain. Although rare, serious side effects including endometrial cancer, risk of blood clots were also discussed. We strongly believe that the benefits far outweigh the risks. Patient understands these risks and consented to starting treatment. Planned treatment duration is 5 years.  Breast cancer surveillance: 1.  Mammograms June 2020 2.  Breast MRIs will be planned for December 2021 3. Breast Exam: 02/01/2019: Benign   Burning discomfort in the left breast: Currently on gabapentin.  I encouraged her to increase it to 600 mg daily.  If that does not work then we may switch her to Cymbalta.  Return to clinic in 3 months to assess tolerability to tamoxifen.

## 2019-06-16 ENCOUNTER — Other Ambulatory Visit (HOSPITAL_COMMUNITY): Payer: Self-pay | Admitting: Physician Assistant

## 2019-06-25 ENCOUNTER — Telehealth: Payer: Self-pay | Admitting: Hematology

## 2019-06-25 ENCOUNTER — Telehealth: Payer: Self-pay | Admitting: Hematology and Oncology

## 2019-06-25 NOTE — Telephone Encounter (Signed)
Faxed medical records to [facility] @ fax#[phone]

## 2019-06-25 NOTE — Telephone Encounter (Signed)
Release I.d: [id number] Faxed medical records to [facility] @ fax#[phone]

## 2019-06-29 ENCOUNTER — Telehealth: Payer: Self-pay

## 2019-06-29 NOTE — Telephone Encounter (Signed)
LVM for pt to call back, I have questions about cpap data

## 2019-06-30 ENCOUNTER — Encounter: Payer: Self-pay | Admitting: Family Medicine

## 2019-06-30 ENCOUNTER — Ambulatory Visit: Payer: PRIVATE HEALTH INSURANCE | Admitting: Family Medicine

## 2019-08-23 ENCOUNTER — Encounter: Payer: Self-pay | Admitting: Cardiovascular Disease

## 2019-08-23 ENCOUNTER — Ambulatory Visit (INDEPENDENT_AMBULATORY_CARE_PROVIDER_SITE_OTHER): Payer: PRIVATE HEALTH INSURANCE | Admitting: Cardiovascular Disease

## 2019-08-23 ENCOUNTER — Other Ambulatory Visit: Payer: Self-pay

## 2019-08-23 VITALS — BP 126/76 | HR 64 | Ht 61.0 in | Wt 229.0 lb

## 2019-08-23 DIAGNOSIS — I10 Essential (primary) hypertension: Secondary | ICD-10-CM | POA: Diagnosis not present

## 2019-08-23 DIAGNOSIS — Z6841 Body Mass Index (BMI) 40.0 and over, adult: Secondary | ICD-10-CM

## 2019-08-23 DIAGNOSIS — R002 Palpitations: Secondary | ICD-10-CM | POA: Diagnosis not present

## 2019-08-23 HISTORY — DX: Palpitations: R00.2

## 2019-08-23 MED ORDER — METOPROLOL SUCCINATE ER 50 MG PO TB24: 50.0000 mg | ORAL_TABLET | Freq: Every day | ORAL | 1 refills | Status: DC
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
## 2019-08-23 NOTE — Patient Instructions (Addendum)
Medication Instructions:  INCREASE YOUR METOPROLOL TO 50 MG DAILY   *If you need a refill on your cardiac medications before your next appointment, please call your pharmacy*  Lab Work: NONE  Testing/Procedures: NONE  Follow-Up: At [facility], you and your health needs are our priority.  As part of our continuing mission to provide you with exceptional heart care, we have created designated Provider Care Teams.  These Care Teams include your primary Cardiologist (physician) and Advanced Practice Providers (APPs -  Physician Assistants and Nurse Practitioners) who all work together to provide you with the care you need, when you need it.  We recommend signing up for the patient portal called "MyChart".  Sign up information is provided on this After Visit Summary.  MyChart is used to connect with patients for Virtual Visits (Telemedicine).  Patients are able to view lab/test results, encounter notes, upcoming appointments, etc.  Non-urgent messages can be sent to your provider as well.   To learn more about what you can do with MyChart, go to [email or web address].    Your next appointment:   6 month(s)  The format for your next appointment:   In Person  Provider:   Your physician recommends that you schedule a follow-up appointment in: [facility]

## 2019-09-09 ENCOUNTER — Other Ambulatory Visit: Payer: Self-pay | Admitting: Physician Assistant

## 2019-09-09 DIAGNOSIS — Z1231 Encounter for screening mammogram for malignant neoplasm of breast: Secondary | ICD-10-CM

## 2019-09-15 ENCOUNTER — Ambulatory Visit: Payer: PRIVATE HEALTH INSURANCE

## 2019-09-17 ENCOUNTER — Other Ambulatory Visit (HOSPITAL_COMMUNITY): Payer: Self-pay | Admitting: Physician Assistant

## 2019-09-20 ENCOUNTER — Other Ambulatory Visit (HOSPITAL_COMMUNITY): Payer: Self-pay | Admitting: Physician Assistant

## 2019-10-22 ENCOUNTER — Other Ambulatory Visit (HOSPITAL_COMMUNITY): Payer: Self-pay | Admitting: Physician Assistant

## 2019-10-26 ENCOUNTER — Other Ambulatory Visit (HOSPITAL_COMMUNITY): Payer: Self-pay | Admitting: Gastroenterology

## 2019-11-01 ENCOUNTER — Other Ambulatory Visit (HOSPITAL_COMMUNITY): Payer: Self-pay | Admitting: Physician Assistant

## 2019-11-30 ENCOUNTER — Other Ambulatory Visit: Payer: Self-pay | Admitting: Orthopaedic Surgery

## 2019-11-30 ENCOUNTER — Other Ambulatory Visit: Payer: Self-pay

## 2019-11-30 ENCOUNTER — Ambulatory Visit (INDEPENDENT_AMBULATORY_CARE_PROVIDER_SITE_OTHER): Payer: PRIVATE HEALTH INSURANCE | Admitting: Orthopaedic Surgery

## 2019-11-30 ENCOUNTER — Ambulatory Visit (INDEPENDENT_AMBULATORY_CARE_PROVIDER_SITE_OTHER): Payer: PRIVATE HEALTH INSURANCE

## 2019-11-30 ENCOUNTER — Encounter: Payer: Self-pay | Admitting: Orthopaedic Surgery

## 2019-11-30 VITALS — Ht 61.0 in | Wt 235.0 lb

## 2019-11-30 DIAGNOSIS — M25561 Pain in right knee: Secondary | ICD-10-CM

## 2019-11-30 DIAGNOSIS — G8929 Other chronic pain: Secondary | ICD-10-CM

## 2019-11-30 DIAGNOSIS — Z6841 Body Mass Index (BMI) 40.0 and over, adult: Secondary | ICD-10-CM | POA: Diagnosis not present

## 2019-11-30 MED ORDER — LIDOCAINE HCL 1 % IJ SOLN
2.0000 mL | INTRAMUSCULAR | Status: AC | PRN
  Administered 2019-11-30: 2 mL

## 2019-11-30 MED ORDER — TRAMADOL HCL 50 MG PO TABS: ORAL_TABLET | ORAL | 0 refills | Status: DC

## 2019-11-30 MED ORDER — BUPIVACAINE HCL 0.5 % IJ SOLN
2.0000 mL | INTRAMUSCULAR | Status: AC | PRN
  Administered 2019-11-30: 2 mL via INTRA_ARTICULAR
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
## 2019-11-30 NOTE — Addendum Note (Signed)
Addended by: Lendon Collar on: 11/30/2019 04:16 PM   Modules accepted: Orders

## 2019-12-20 ENCOUNTER — Other Ambulatory Visit (HOSPITAL_COMMUNITY): Payer: Self-pay | Admitting: Physician Assistant

## 2019-12-21 ENCOUNTER — Other Ambulatory Visit (HOSPITAL_COMMUNITY): Payer: Self-pay | Admitting: Physician Assistant

## 2019-12-22 ENCOUNTER — Other Ambulatory Visit (HOSPITAL_COMMUNITY): Payer: Self-pay | Admitting: Family Medicine

## 2020-02-01 ENCOUNTER — Other Ambulatory Visit: Payer: Self-pay | Admitting: Hematology and Oncology

## 2020-02-15 ENCOUNTER — Other Ambulatory Visit (HOSPITAL_COMMUNITY): Payer: Self-pay | Admitting: Family Medicine

## 2020-02-28 ENCOUNTER — Ambulatory Visit: Payer: PRIVATE HEALTH INSURANCE

## 2020-03-09 ENCOUNTER — Inpatient Hospital Stay: Admission: RE | Admit: 2020-03-09 | Payer: PRIVATE HEALTH INSURANCE | Source: Ambulatory Visit

## 2020-03-20 ENCOUNTER — Other Ambulatory Visit (HOSPITAL_COMMUNITY): Payer: Self-pay | Admitting: Physician Assistant

## 2020-03-20 ENCOUNTER — Other Ambulatory Visit: Payer: Self-pay | Admitting: Physician Assistant

## 2020-03-20 ENCOUNTER — Ambulatory Visit
Admission: RE | Admit: 2020-03-20 | Discharge: 2020-03-20 | Disposition: A | Discharge status: Home / Self Care | Payer: PRIVATE HEALTH INSURANCE | Source: Ambulatory Visit | Attending: Physician Assistant | Admitting: Physician Assistant

## 2020-03-20 DIAGNOSIS — R0602 Shortness of breath: Secondary | ICD-10-CM

## 2020-03-20 DIAGNOSIS — Z8616 Personal history of COVID-19: Secondary | ICD-10-CM

## 2020-04-12 ENCOUNTER — Other Ambulatory Visit (HOSPITAL_COMMUNITY): Payer: Self-pay | Admitting: Physician Assistant

## 2020-04-17 ENCOUNTER — Other Ambulatory Visit (HOSPITAL_COMMUNITY): Payer: Self-pay | Admitting: Physician Assistant

## 2020-04-18 ENCOUNTER — Other Ambulatory Visit: Payer: Self-pay | Admitting: Orthopaedic Surgery

## 2020-04-18 NOTE — Telephone Encounter (Signed)
Has been 4 months since office visit-cannot prescribe meds without an evaluation

## 2020-04-28 ENCOUNTER — Other Ambulatory Visit (HOSPITAL_COMMUNITY): Payer: Self-pay | Admitting: Physician Assistant

## 2020-04-30 ENCOUNTER — Other Ambulatory Visit (HOSPITAL_COMMUNITY): Payer: Self-pay

## 2020-04-30 MED FILL — Atogepant Tab 10 MG: ORAL | 30 days supply | Qty: 30 | Fill #0 | Status: AC

## 2020-05-01 ENCOUNTER — Other Ambulatory Visit (HOSPITAL_COMMUNITY): Payer: Self-pay

## 2020-05-01 MED FILL — Fluticasone Propionate Nasal Susp 50 MCG/ACT: NASAL | 30 days supply | Qty: 16 | Fill #0 | Status: AC

## 2020-05-01 MED FILL — Metoprolol Succinate Tab ER 24HR 25 MG (Tartrate Equiv): ORAL | 30 days supply | Qty: 30 | Fill #0 | Status: AC

## 2020-05-01 MED FILL — Levothyroxine Sodium Tab 50 MCG: ORAL | 30 days supply | Qty: 30 | Fill #0 | Status: AC

## 2020-05-01 MED FILL — Ondansetron Orally Disintegrating Tab 8 MG: ORAL | 5 days supply | Qty: 20 | Fill #0 | Status: AC

## 2020-05-01 MED FILL — Pantoprazole Sodium EC Tab 40 MG (Base Equiv): ORAL | 90 days supply | Qty: 90 | Fill #0 | Status: AC

## 2020-05-02 ENCOUNTER — Other Ambulatory Visit (HOSPITAL_COMMUNITY): Payer: Self-pay

## 2020-05-02 MED ORDER — ALBUTEROL SULFATE HFA 108 (90 BASE) MCG/ACT IN AERS
INHALATION_SPRAY | RESPIRATORY_TRACT | 6 refills | Status: DC
  Filled 2020-05-02: qty 8.5, 30d supply, fill #0
  Filled 2020-06-28: qty 8.5, 30d supply, fill #1
  Filled 2020-08-30: qty 8.5, 30d supply, fill #2
  Filled 2021-01-25: qty 8.5, 30d supply, fill #3
  Filled 2021-04-02: qty 8.5, 33d supply, fill #0

## 2020-05-09 ENCOUNTER — Other Ambulatory Visit (HOSPITAL_COMMUNITY): Payer: Self-pay

## 2020-05-11 ENCOUNTER — Other Ambulatory Visit (HOSPITAL_COMMUNITY): Payer: Self-pay

## 2020-05-11 MED ORDER — NITROGLYCERIN 0.4 MG/HR TD PT24
MEDICATED_PATCH | TRANSDERMAL | 0 refills | Status: DC
  Filled 2020-05-11: qty 30, 30d supply, fill #0

## 2020-05-12 ENCOUNTER — Other Ambulatory Visit (HOSPITAL_COMMUNITY): Payer: Self-pay

## 2020-06-02 ENCOUNTER — Other Ambulatory Visit (HOSPITAL_COMMUNITY): Payer: Self-pay

## 2020-06-06 ENCOUNTER — Other Ambulatory Visit (HOSPITAL_COMMUNITY): Payer: Self-pay

## 2020-06-06 MED ORDER — PREDNISONE 10 MG PO TABS
ORAL_TABLET | ORAL | 0 refills | Status: DC
  Filled 2020-06-06: qty 21, 6d supply, fill #0

## 2020-06-12 ENCOUNTER — Other Ambulatory Visit (HOSPITAL_COMMUNITY): Payer: Self-pay

## 2020-06-12 MED ORDER — TRIAMCINOLONE ACETONIDE 0.5 % EX OINT
TOPICAL_OINTMENT | CUTANEOUS | 1 refills | Status: DC
  Filled 2020-06-12: qty 30, 30d supply, fill #0
  Filled 2021-01-25: qty 30, 30d supply, fill #1

## 2020-06-27 ENCOUNTER — Other Ambulatory Visit (HOSPITAL_COMMUNITY): Payer: Self-pay

## 2020-06-27 MED ORDER — ARIPIPRAZOLE 2 MG PO TABS
ORAL_TABLET | ORAL | 2 refills | Status: AC
  Filled 2020-06-27: qty 60, 30d supply, fill #0
  Filled 2020-08-30: qty 59, 29d supply, fill #1
  Filled 2020-08-30: qty 60, 30d supply, fill #1
  Filled 2020-08-30: qty 1, 1d supply, fill #1
  Filled 2020-08-30: qty 29, 29d supply, fill #1

## 2020-06-27 MED ORDER — LEVOTHYROXINE SODIUM 50 MCG PO TABS
ORAL_TABLET | ORAL | 2 refills | Status: DC
  Filled 2020-06-27: qty 30, 30d supply, fill #0

## 2020-06-27 MED ORDER — FLOVENT HFA 220 MCG/ACT IN AERO
INHALATION_SPRAY | RESPIRATORY_TRACT | 2 refills | Status: DC
  Filled 2020-06-27: qty 12, 30d supply, fill #0
  Filled 2020-08-30: qty 12, 30d supply, fill #1
  Filled 2021-01-25: qty 12, 30d supply, fill #2
  Filled 2021-04-04: qty 12, 30d supply, fill #0

## 2020-06-27 MED FILL — Atogepant Tab 10 MG: ORAL | 30 days supply | Qty: 30 | Fill #1 | Status: AC

## 2020-06-27 MED FILL — Metoprolol Succinate Tab ER 24HR 25 MG (Tartrate Equiv): ORAL | 30 days supply | Qty: 30 | Fill #1 | Status: AC

## 2020-06-27 MED FILL — Hydrochlorothiazide Tab 12.5 MG: ORAL | 30 days supply | Qty: 30 | Fill #0 | Status: AC

## 2020-06-28 ENCOUNTER — Other Ambulatory Visit (HOSPITAL_COMMUNITY): Payer: Self-pay

## 2020-06-28 MED ORDER — CLOBETASOL PROPIONATE 0.05 % EX OINT
TOPICAL_OINTMENT | CUTANEOUS | 0 refills | Status: DC
  Filled 2020-06-28: qty 30, 10d supply, fill #0

## 2020-07-06 ENCOUNTER — Other Ambulatory Visit (HOSPITAL_COMMUNITY): Payer: Self-pay

## 2020-07-06 MED ORDER — SULFAMETHOXAZOLE-TRIMETHOPRIM 800-160 MG PO TABS
ORAL_TABLET | ORAL | 0 refills | Status: DC
  Filled 2020-07-06: qty 6, 3d supply, fill #0

## 2020-07-19 ENCOUNTER — Ambulatory Visit
Admission: RE | Admit: 2020-07-19 | Discharge: 2020-07-19 | Disposition: A | Discharge status: Home / Self Care | Payer: PRIVATE HEALTH INSURANCE | Source: Ambulatory Visit | Attending: Sports Medicine | Admitting: Sports Medicine

## 2020-07-19 ENCOUNTER — Other Ambulatory Visit: Payer: Self-pay | Admitting: Sports Medicine

## 2020-07-19 ENCOUNTER — Other Ambulatory Visit: Payer: Self-pay

## 2020-07-19 DIAGNOSIS — M25521 Pain in right elbow: Secondary | ICD-10-CM

## 2020-07-20 ENCOUNTER — Other Ambulatory Visit (HOSPITAL_COMMUNITY): Payer: Self-pay

## 2020-07-20 MED ORDER — LEVOTHYROXINE SODIUM 50 MCG PO TABS
ORAL_TABLET | ORAL | 0 refills | Status: DC
  Filled 2020-07-20: qty 70, 90d supply, fill #0

## 2020-07-20 MED ORDER — LEVOTHYROXINE SODIUM 75 MCG PO TABS
ORAL_TABLET | ORAL | 0 refills | Status: DC
  Filled 2020-07-20: qty 24, 90d supply, fill #0

## 2020-07-27 ENCOUNTER — Ambulatory Visit
Admission: RE | Admit: 2020-07-27 | Discharge: 2020-07-27 | Disposition: A | Discharge status: Home / Self Care | Payer: No Typology Code available for payment source | Source: Ambulatory Visit | Attending: Sports Medicine | Admitting: Sports Medicine

## 2020-07-27 DIAGNOSIS — M25521 Pain in right elbow: Secondary | ICD-10-CM

## 2020-08-30 ENCOUNTER — Other Ambulatory Visit (HOSPITAL_COMMUNITY): Payer: Self-pay

## 2020-08-30 MED FILL — Metoprolol Succinate Tab ER 24HR 25 MG (Tartrate Equiv): ORAL | 30 days supply | Qty: 30 | Fill #2 | Status: AC

## 2020-08-30 MED FILL — Hydrochlorothiazide Tab 12.5 MG: ORAL | 30 days supply | Qty: 30 | Fill #1 | Status: AC

## 2020-08-31 ENCOUNTER — Other Ambulatory Visit (HOSPITAL_COMMUNITY): Payer: Self-pay

## 2020-08-31 MED ORDER — PANTOPRAZOLE SODIUM 40 MG PO TBEC
40.0000 mg | DELAYED_RELEASE_TABLET | Freq: Every day | ORAL | 2 refills | Status: DC
  Filled 2020-08-31: qty 90, 90d supply, fill #0
  Filled 2021-01-25 – 2021-02-07 (×2): qty 30, 30d supply, fill #1
  Filled 2021-07-05: qty 30, 30d supply, fill #2

## 2020-09-08 ENCOUNTER — Other Ambulatory Visit (HOSPITAL_COMMUNITY): Payer: Self-pay

## 2020-09-08 MED ORDER — PREDNISONE 20 MG PO TABS
40.0000 mg | ORAL_TABLET | Freq: Every day | ORAL | 0 refills | Status: DC
  Filled 2020-09-08: qty 10, 5d supply, fill #0

## 2020-09-11 ENCOUNTER — Other Ambulatory Visit (HOSPITAL_COMMUNITY): Payer: Self-pay

## 2020-09-11 MED ORDER — BENZONATATE 100 MG PO CAPS
100.0000 mg | ORAL_CAPSULE | Freq: Three times a day (TID) | ORAL | 0 refills | Status: AC | PRN
  Filled 2020-09-11: qty 30, 10d supply, fill #0

## 2020-09-11 MED ORDER — AZITHROMYCIN 500 MG PO TABS
500.0000 mg | ORAL_TABLET | Freq: Every day | ORAL | 0 refills | Status: DC
  Filled 2020-09-11: qty 3, 3d supply, fill #0

## 2020-09-15 ENCOUNTER — Other Ambulatory Visit (HOSPITAL_COMMUNITY): Payer: Self-pay | Admitting: Radiology

## 2020-09-15 DIAGNOSIS — J441 Chronic obstructive pulmonary disease with (acute) exacerbation: Secondary | ICD-10-CM

## 2020-09-25 ENCOUNTER — Other Ambulatory Visit (HOSPITAL_COMMUNITY): Payer: Self-pay

## 2020-09-25 MED ORDER — HYDROCHLOROTHIAZIDE 12.5 MG PO TABS
12.5000 mg | ORAL_TABLET | Freq: Every morning | ORAL | 6 refills | Status: DC
  Filled 2020-09-25: qty 30, 30d supply, fill #0
  Filled 2021-01-25 – 2021-02-07 (×2): qty 30, 30d supply, fill #1
  Filled 2021-04-02: qty 30, 30d supply, fill #2
  Filled 2021-07-05: qty 30, 30d supply, fill #3
  Filled 2021-08-02: qty 30, 30d supply, fill #4
  Filled 2021-09-03: qty 30, 30d supply, fill #5

## 2020-09-25 MED ORDER — UBRELVY 100 MG PO TABS
ORAL_TABLET | ORAL | 2 refills | Status: DC
  Filled 2020-09-25 – 2021-01-25 (×4): qty 10, 30d supply, fill #0
  Filled 2021-02-07: qty 15, 30d supply, fill #0
  Filled 2021-04-02: qty 15, 30d supply, fill #1
  Filled 2021-07-05: qty 15, 30d supply, fill #2

## 2020-09-25 MED ORDER — QULIPTA 10 MG PO TABS
ORAL_TABLET | Freq: Every day | ORAL | 12 refills | Status: DC
  Filled 2020-09-25 – 2021-02-07 (×2): qty 30, 30d supply, fill #0

## 2020-09-25 MED ORDER — ALPRAZOLAM 0.5 MG PO TABS
0.5000 mg | ORAL_TABLET | Freq: Every day | ORAL | 0 refills | Status: DC
  Filled 2020-09-25: qty 30, 30d supply, fill #0

## 2020-09-25 MED ORDER — FEXOFENADINE HCL 180 MG PO TABS
180.0000 mg | ORAL_TABLET | Freq: Every day | ORAL | 2 refills | Status: DC
  Filled 2020-09-25 – 2021-09-03 (×4): qty 90, 90d supply, fill #0

## 2020-09-25 MED ORDER — DICYCLOMINE HCL 20 MG PO TABS
20.0000 mg | ORAL_TABLET | Freq: Three times a day (TID) | ORAL | 3 refills | Status: DC
  Filled 2020-09-25: qty 90, 30d supply, fill #0
  Filled 2021-01-25 – 2021-02-06 (×2): qty 90, 30d supply, fill #1
  Filled 2021-04-02: qty 90, 30d supply, fill #2
  Filled 2021-08-02: qty 90, 30d supply, fill #3

## 2020-09-25 MED ORDER — LEVOTHYROXINE SODIUM 75 MCG PO TABS
75.0000 ug | ORAL_TABLET | Freq: Every morning | ORAL | 0 refills | Status: DC
  Filled 2020-09-25: qty 24, 84d supply, fill #0

## 2020-09-25 MED ORDER — PANTOPRAZOLE SODIUM 40 MG PO TBEC
40.0000 mg | DELAYED_RELEASE_TABLET | Freq: Every day | ORAL | 3 refills | Status: DC
  Filled 2020-09-25 – 2021-04-02 (×2): qty 90, 90d supply, fill #0
  Filled 2021-07-05: qty 30, 30d supply, fill #0
  Filled 2021-08-02: qty 30, 30d supply, fill #1
  Filled 2021-09-03: qty 30, 30d supply, fill #2

## 2020-09-25 MED ORDER — LEVOTHYROXINE SODIUM 50 MCG PO TABS
50.0000 ug | ORAL_TABLET | Freq: Every morning | ORAL | 0 refills | Status: DC
  Filled 2020-09-25: qty 65, 90d supply, fill #0

## 2020-09-25 MED ORDER — FLUTICASONE PROPIONATE 50 MCG/ACT NA SUSP
2.0000 | Freq: Every day | NASAL | 3 refills | Status: DC
  Filled 2020-09-25: qty 16, 30d supply, fill #0
  Filled 2021-01-25 – 2021-02-06 (×2): qty 16, 30d supply, fill #1
  Filled 2021-04-02: qty 16, 30d supply, fill #2
  Filled 2021-07-05: qty 16, 30d supply, fill #3

## 2020-09-25 MED ORDER — ATORVASTATIN CALCIUM 10 MG PO TABS
10.0000 mg | ORAL_TABLET | Freq: Every day | ORAL | 2 refills | Status: DC
  Filled 2020-09-25: qty 90, 90d supply, fill #0
  Filled 2021-01-25 – 2021-02-06 (×2): qty 30, 30d supply, fill #1
  Filled 2021-04-02: qty 30, 30d supply, fill #2
  Filled 2021-07-05: qty 30, 30d supply, fill #3
  Filled 2021-08-02: qty 30, 30d supply, fill #4
  Filled 2021-09-03: qty 30, 30d supply, fill #5

## 2020-09-25 MED ORDER — CLOBETASOL PROPIONATE 0.05 % EX OINT
TOPICAL_OINTMENT | CUTANEOUS | 1 refills | Status: DC
  Filled 2020-09-25: qty 30, 14d supply, fill #0
  Filled 2021-01-25 – 2021-02-06 (×2): qty 30, 14d supply, fill #1

## 2020-09-25 MED ORDER — GABAPENTIN 300 MG PO CAPS
300.0000 mg | ORAL_CAPSULE | Freq: Three times a day (TID) | ORAL | 0 refills | Status: DC
  Filled 2020-09-25: qty 180, 60d supply, fill #0

## 2020-09-25 MED ORDER — ERGOCALCIFEROL 1.25 MG (50000 UT) PO CAPS
1.0000 | ORAL_CAPSULE | ORAL | 1 refills | Status: DC
  Filled 2020-09-25: qty 24, 84d supply, fill #0
  Filled 2021-01-25 – 2021-02-07 (×2): qty 8, 28d supply, fill #1
  Filled 2021-04-02: qty 8, 28d supply, fill #2
  Filled 2021-07-05: qty 8, 28d supply, fill #3

## 2020-09-25 MED ORDER — MONTELUKAST SODIUM 10 MG PO TABS
10.0000 mg | ORAL_TABLET | Freq: Every day | ORAL | 3 refills | Status: DC
  Filled 2020-09-25: qty 90, 90d supply, fill #0
  Filled 2021-01-25 – 2021-02-07 (×2): qty 30, 30d supply, fill #1
  Filled 2021-04-02: qty 30, 30d supply, fill #2
  Filled 2021-07-05: qty 30, 30d supply, fill #3
  Filled 2021-08-02: qty 30, 30d supply, fill #4
  Filled 2021-09-03: qty 30, 30d supply, fill #5

## 2020-09-25 MED ORDER — ARIPIPRAZOLE 2 MG PO TABS
4.0000 mg | ORAL_TABLET | Freq: Every day | ORAL | 3 refills | Status: DC
  Filled 2020-09-25: qty 90, 45d supply, fill #0
  Filled 2021-01-25: qty 60, 30d supply, fill #1
  Filled 2021-02-07: qty 90, 45d supply, fill #1
  Filled 2021-04-02: qty 60, 30d supply, fill #1
  Filled 2021-07-05: qty 60, 30d supply, fill #2
  Filled 2021-08-02: qty 60, 30d supply, fill #3
  Filled 2021-09-03: qty 60, 30d supply, fill #4

## 2020-09-25 MED ORDER — METOPROLOL SUCCINATE ER 25 MG PO TB24
25.0000 mg | ORAL_TABLET | Freq: Every day | ORAL | 3 refills | Status: DC
  Filled 2020-09-25: qty 90, 90d supply, fill #0
  Filled 2021-01-25 – 2021-04-02 (×2): qty 30, 30d supply, fill #1
  Filled 2021-07-05: qty 30, 30d supply, fill #2
  Filled 2021-08-02: qty 30, 30d supply, fill #3
  Filled 2021-09-03: qty 30, 30d supply, fill #4

## 2020-09-26 ENCOUNTER — Other Ambulatory Visit (HOSPITAL_COMMUNITY): Payer: Self-pay

## 2020-09-26 MED ORDER — LEVOTHYROXINE SODIUM 137 MCG PO TABS
137.0000 ug | ORAL_TABLET | Freq: Every morning | ORAL | 1 refills | Status: DC
  Filled 2020-09-26: qty 90, 90d supply, fill #0
  Filled 2021-01-25 – 2021-02-07 (×2): qty 30, 30d supply, fill #1
  Filled 2021-04-02: qty 30, 30d supply, fill #2
  Filled 2021-07-05: qty 30, 30d supply, fill #3

## 2020-09-26 MED FILL — Ubrogepant Tab 100 MG: ORAL | 30 days supply | Qty: 10 | Fill #0 | Status: CN

## 2020-09-26 MED FILL — Atogepant Tab 10 MG: ORAL | 30 days supply | Qty: 30 | Fill #2 | Status: AC

## 2020-09-26 MED FILL — Ubrogepant Tab 100 MG: ORAL | 30 days supply | Qty: 10 | Fill #0 | Status: AC

## 2020-09-27 ENCOUNTER — Other Ambulatory Visit (HOSPITAL_COMMUNITY): Payer: Self-pay

## 2020-10-04 ENCOUNTER — Encounter (HOSPITAL_COMMUNITY): Payer: No Typology Code available for payment source

## 2020-11-08 ENCOUNTER — Other Ambulatory Visit (HOSPITAL_COMMUNITY): Payer: Self-pay

## 2020-11-08 MED ORDER — HYDROXYZINE HCL 25 MG PO TABS
25.0000 mg | ORAL_TABLET | Freq: Two times a day (BID) | ORAL | 0 refills | Status: DC
  Filled 2020-11-08: qty 20, 10d supply, fill #0

## 2021-01-24 ENCOUNTER — Other Ambulatory Visit (HOSPITAL_COMMUNITY): Payer: Self-pay

## 2021-01-24 MED ORDER — PREDNISONE 10 MG (21) PO TBPK
ORAL_TABLET | ORAL | 0 refills | Status: DC
  Filled 2021-01-24: qty 21, 6d supply, fill #0

## 2021-01-25 ENCOUNTER — Other Ambulatory Visit (HOSPITAL_COMMUNITY): Payer: Self-pay

## 2021-01-25 MED FILL — Sucralfate Susp 1 GM/10ML: ORAL | 30 days supply | Qty: 1200 | Fill #0 | Status: CN

## 2021-01-25 MED FILL — Sucralfate Susp 1 GM/10ML: ORAL | 21 days supply | Qty: 840 | Fill #0 | Status: CN

## 2021-01-25 MED FILL — Tamoxifen Citrate Tab 20 MG (Base Equivalent): ORAL | 30 days supply | Qty: 30 | Fill #0 | Status: CN

## 2021-01-26 ENCOUNTER — Other Ambulatory Visit (HOSPITAL_COMMUNITY): Payer: Self-pay

## 2021-01-26 MED ORDER — GABAPENTIN 300 MG PO CAPS
300.0000 mg | ORAL_CAPSULE | Freq: Three times a day (TID) | ORAL | 1 refills | Status: DC
  Filled 2021-01-26 – 2021-02-06 (×2): qty 90, 30d supply, fill #0
  Filled 2021-04-02: qty 90, 30d supply, fill #1
  Filled 2021-07-05: qty 90, 30d supply, fill #2
  Filled 2021-08-02: qty 90, 30d supply, fill #3

## 2021-01-29 ENCOUNTER — Other Ambulatory Visit (HOSPITAL_COMMUNITY): Payer: Self-pay

## 2021-02-05 ENCOUNTER — Other Ambulatory Visit (HOSPITAL_COMMUNITY): Payer: Self-pay

## 2021-02-06 ENCOUNTER — Other Ambulatory Visit (HOSPITAL_COMMUNITY): Payer: Self-pay

## 2021-02-07 ENCOUNTER — Other Ambulatory Visit (HOSPITAL_COMMUNITY): Payer: Self-pay

## 2021-02-07 MED ORDER — EMGALITY 120 MG/ML ~~LOC~~ SOAJ
120.0000 mg | SUBCUTANEOUS | 5 refills | Status: DC
  Filled 2021-02-07 – 2021-02-12 (×4): qty 2, 30d supply, fill #0
  Filled 2021-02-15: qty 2, 28d supply, fill #0
  Filled 2021-04-02: qty 1, 30d supply, fill #1
  Filled 2021-07-05: qty 1, 30d supply, fill #2
  Filled 2021-08-02: qty 1, 30d supply, fill #3
  Filled 2021-09-03 – 2021-09-13 (×3): qty 1, 30d supply, fill #4
  Filled 2021-11-22: qty 1, 30d supply, fill #5

## 2021-02-08 ENCOUNTER — Other Ambulatory Visit (HOSPITAL_COMMUNITY): Payer: Self-pay

## 2021-02-08 MED ORDER — ALBUTEROL SULFATE HFA 108 (90 BASE) MCG/ACT IN AERS
1.0000 | INHALATION_SPRAY | RESPIRATORY_TRACT | 1 refills | Status: DC | PRN
  Filled 2021-02-08: qty 8.5, 33d supply, fill #0
  Filled 2021-04-02 – 2021-07-05 (×2): qty 8.5, 33d supply, fill #1

## 2021-02-12 ENCOUNTER — Other Ambulatory Visit (HOSPITAL_COMMUNITY): Payer: Self-pay

## 2021-02-14 ENCOUNTER — Other Ambulatory Visit (HOSPITAL_COMMUNITY): Payer: Self-pay

## 2021-02-14 MED ORDER — AMITRIPTYLINE HCL 25 MG PO TABS
25.0000 mg | ORAL_TABLET | Freq: Every day | ORAL | 5 refills | Status: DC
  Filled 2021-02-14: qty 30, 30d supply, fill #0
  Filled 2021-04-02: qty 30, 30d supply, fill #1
  Filled 2021-07-05: qty 30, 30d supply, fill #2
  Filled 2021-08-02: qty 30, 30d supply, fill #3
  Filled 2021-09-03: qty 30, 30d supply, fill #4
  Filled 2021-11-22: qty 30, 30d supply, fill #5

## 2021-02-15 ENCOUNTER — Other Ambulatory Visit (HOSPITAL_COMMUNITY): Payer: Self-pay

## 2021-02-20 ENCOUNTER — Other Ambulatory Visit (HOSPITAL_COMMUNITY): Payer: Self-pay

## 2021-02-27 ENCOUNTER — Other Ambulatory Visit (HOSPITAL_COMMUNITY): Payer: Self-pay

## 2021-04-02 ENCOUNTER — Other Ambulatory Visit (HOSPITAL_COMMUNITY): Payer: Self-pay

## 2021-04-03 ENCOUNTER — Other Ambulatory Visit (HOSPITAL_COMMUNITY): Payer: Self-pay

## 2021-04-04 ENCOUNTER — Other Ambulatory Visit (HOSPITAL_COMMUNITY): Payer: Self-pay

## 2021-04-04 MED ORDER — CLOBETASOL PROPIONATE 0.05 % EX OINT
TOPICAL_OINTMENT | Freq: Two times a day (BID) | CUTANEOUS | 1 refills | Status: DC
  Filled 2021-04-04: qty 30, 14d supply, fill #0
  Filled 2021-07-05: qty 30, 14d supply, fill #1

## 2021-04-17 ENCOUNTER — Other Ambulatory Visit (HOSPITAL_COMMUNITY): Payer: Self-pay

## 2021-04-17 ENCOUNTER — Other Ambulatory Visit (HOSPITAL_COMMUNITY): Payer: Self-pay | Admitting: Internal Medicine

## 2021-04-17 MED ORDER — SEREVENT DISKUS 50 MCG/ACT IN AEPB
1.0000 | INHALATION_SPRAY | Freq: Two times a day (BID) | RESPIRATORY_TRACT | 3 refills | Status: DC
  Filled 2021-04-17: qty 60, 30d supply, fill #0

## 2021-04-17 MED ORDER — DOXYCYCLINE HYCLATE 100 MG PO TABS
100.0000 mg | ORAL_TABLET | Freq: Two times a day (BID) | ORAL | 0 refills | Status: DC
  Filled 2021-04-17: qty 20, 10d supply, fill #0

## 2021-04-18 ENCOUNTER — Other Ambulatory Visit (HOSPITAL_COMMUNITY): Payer: Self-pay

## 2021-04-18 MED ORDER — ADVAIR HFA 115-21 MCG/ACT IN AERO
2.0000 | INHALATION_SPRAY | Freq: Two times a day (BID) | RESPIRATORY_TRACT | 1 refills | Status: DC
  Filled 2021-04-18 – 2021-11-22 (×5): qty 12, 30d supply, fill #0

## 2021-04-18 MED ORDER — FLUTICASONE-SALMETEROL 250-50 MCG/ACT IN AEPB
1.0000 | INHALATION_SPRAY | Freq: Two times a day (BID) | RESPIRATORY_TRACT | 0 refills | Status: DC
  Filled 2021-04-18: qty 60, 30d supply, fill #0

## 2021-05-29 ENCOUNTER — Other Ambulatory Visit (HOSPITAL_COMMUNITY): Payer: Self-pay

## 2021-05-29 MED ORDER — DILTIAZEM HCL ER BEADS 120 MG PO CP24
120.0000 mg | ORAL_CAPSULE | Freq: Every day | ORAL | 5 refills | Status: DC
  Filled 2021-05-29: qty 30, 30d supply, fill #0
  Filled 2021-07-05: qty 30, 30d supply, fill #1
  Filled 2021-08-02: qty 30, 30d supply, fill #2
  Filled 2021-09-03: qty 30, 30d supply, fill #3
  Filled 2021-11-22: qty 30, 30d supply, fill #4
  Filled 2022-01-23: qty 30, 30d supply, fill #5

## 2021-06-22 ENCOUNTER — Other Ambulatory Visit (HOSPITAL_COMMUNITY): Payer: Self-pay

## 2021-06-22 MED ORDER — EPINEPHRINE 0.3 MG/0.3ML IJ SOAJ
INTRAMUSCULAR | 1 refills | Status: AC | PRN
  Filled 2021-06-22: qty 2, 2d supply, fill #0

## 2021-07-05 ENCOUNTER — Other Ambulatory Visit (HOSPITAL_COMMUNITY): Payer: Self-pay

## 2021-07-05 MED ORDER — ALPRAZOLAM 0.5 MG PO TABS
0.5000 mg | ORAL_TABLET | Freq: Every day | ORAL | 0 refills | Status: DC | PRN
  Filled 2021-07-05: qty 30, 30d supply, fill #0

## 2021-07-05 MED ORDER — FLUTICASONE-SALMETEROL 250-50 MCG/ACT IN AEPB
1.0000 | INHALATION_SPRAY | Freq: Two times a day (BID) | RESPIRATORY_TRACT | 0 refills | Status: DC
  Filled 2021-07-05: qty 60, 30d supply, fill #0

## 2021-08-02 ENCOUNTER — Other Ambulatory Visit (HOSPITAL_COMMUNITY): Payer: Self-pay

## 2021-08-02 MED ORDER — LEVOTHYROXINE SODIUM 137 MCG PO TABS
137.0000 ug | ORAL_TABLET | Freq: Every morning | ORAL | 0 refills | Status: DC
  Filled 2021-08-02: qty 30, 30d supply, fill #0

## 2021-08-02 MED ORDER — UBRELVY 100 MG PO TABS
100.0000 mg | ORAL_TABLET | ORAL | 0 refills | Status: DC
  Filled 2021-08-02: qty 15, 30d supply, fill #0

## 2021-08-02 MED ORDER — VITAMIN D (ERGOCALCIFEROL) 1.25 MG (50000 UNIT) PO CAPS
50000.0000 [IU] | ORAL_CAPSULE | ORAL | 1 refills | Status: DC
  Filled 2021-08-02: qty 8, 28d supply, fill #0
  Filled 2021-09-03: qty 8, 28d supply, fill #1
  Filled 2022-01-23: qty 8, 28d supply, fill #2

## 2021-08-02 MED ORDER — FLUTICASONE-SALMETEROL 250-50 MCG/ACT IN AEPB
1.0000 | INHALATION_SPRAY | Freq: Two times a day (BID) | RESPIRATORY_TRACT | 0 refills | Status: DC
  Filled 2021-08-02: qty 60, 30d supply, fill #0

## 2021-08-02 MED ORDER — ALBUTEROL SULFATE HFA 108 (90 BASE) MCG/ACT IN AERS
1.0000 | INHALATION_SPRAY | RESPIRATORY_TRACT | 1 refills | Status: DC | PRN
  Filled 2021-08-02: qty 8.5, 33d supply, fill #0
  Filled 2022-01-23: qty 6.7, 17d supply, fill #1
  Filled 2022-04-16: qty 8.5, fill #2

## 2021-08-02 MED ORDER — CLOBETASOL PROPIONATE 0.05 % EX OINT
1.0000 | TOPICAL_OINTMENT | Freq: Two times a day (BID) | CUTANEOUS | 0 refills | Status: AC
  Filled 2021-08-02: qty 30, 14d supply, fill #0

## 2021-08-03 ENCOUNTER — Other Ambulatory Visit (HOSPITAL_COMMUNITY): Payer: Self-pay

## 2021-08-06 ENCOUNTER — Other Ambulatory Visit (HOSPITAL_COMMUNITY): Payer: Self-pay

## 2021-08-06 MED ORDER — ALPRAZOLAM 0.5 MG PO TABS
0.5000 mg | ORAL_TABLET | Freq: Every day | ORAL | 0 refills | Status: DC | PRN
  Filled 2021-08-06: qty 30, 30d supply, fill #0

## 2021-08-08 ENCOUNTER — Other Ambulatory Visit (HOSPITAL_COMMUNITY): Payer: Self-pay

## 2021-08-08 MED ORDER — FLUTICASONE PROPIONATE 50 MCG/ACT NA SUSP
2.0000 | Freq: Every day | NASAL | 2 refills | Status: DC
  Filled 2021-08-08: qty 16, 30d supply, fill #0
  Filled 2021-09-03: qty 16, 30d supply, fill #1
  Filled 2022-04-16: qty 16, 30d supply, fill #2

## 2021-08-23 ENCOUNTER — Other Ambulatory Visit (HOSPITAL_COMMUNITY): Payer: Self-pay

## 2021-08-23 MED ORDER — EUTHYROX 175 MCG PO TABS
175.0000 ug | ORAL_TABLET | Freq: Every day | ORAL | 5 refills | Status: DC
  Filled 2021-08-23: qty 30, 30d supply, fill #0
  Filled 2021-09-20: qty 30, 30d supply, fill #1

## 2021-08-24 ENCOUNTER — Other Ambulatory Visit (HOSPITAL_COMMUNITY): Payer: Self-pay

## 2021-09-03 ENCOUNTER — Other Ambulatory Visit (HOSPITAL_COMMUNITY): Payer: Self-pay

## 2021-09-03 MED ORDER — GABAPENTIN 300 MG PO CAPS
300.0000 mg | ORAL_CAPSULE | Freq: Three times a day (TID) | ORAL | 1 refills | Status: DC
  Filled 2021-09-03: qty 90, 30d supply, fill #0
  Filled 2021-11-22: qty 90, 30d supply, fill #1
  Filled 2022-04-16: qty 90, 30d supply, fill #2
  Filled 2022-07-25: qty 90, 30d supply, fill #3

## 2021-09-03 MED ORDER — FLUTICASONE-SALMETEROL 250-50 MCG/ACT IN AEPB
1.0000 | INHALATION_SPRAY | Freq: Two times a day (BID) | RESPIRATORY_TRACT | 1 refills | Status: DC
  Filled 2021-09-03: qty 60, 30d supply, fill #0
  Filled 2021-11-22: qty 60, 30d supply, fill #1

## 2021-09-03 MED ORDER — DICYCLOMINE HCL 20 MG PO TABS
20.0000 mg | ORAL_TABLET | Freq: Three times a day (TID) | ORAL | 1 refills | Status: DC
  Filled 2021-09-03: qty 90, 30d supply, fill #0
  Filled 2021-11-22: qty 90, 30d supply, fill #1

## 2021-09-04 ENCOUNTER — Other Ambulatory Visit (HOSPITAL_COMMUNITY): Payer: Self-pay

## 2021-09-04 MED ORDER — PREDNISONE 20 MG PO TABS
ORAL_TABLET | ORAL | 0 refills | Status: AC
  Filled 2021-09-04: qty 12, 6d supply, fill #0

## 2021-09-04 MED ORDER — DOXYCYCLINE HYCLATE 100 MG PO TABS
100.0000 mg | ORAL_TABLET | Freq: Two times a day (BID) | ORAL | 0 refills | Status: AC
  Filled 2021-09-04: qty 20, 10d supply, fill #0

## 2021-09-04 MED ORDER — HYDROCODONE BIT-HOMATROP MBR 5-1.5 MG/5ML PO SOLN
5.0000 mL | Freq: Every evening | ORAL | 0 refills | Status: AC | PRN
  Filled 2021-09-04: qty 100, 20d supply, fill #0

## 2021-09-10 ENCOUNTER — Other Ambulatory Visit (HOSPITAL_COMMUNITY): Payer: Self-pay

## 2021-09-13 ENCOUNTER — Other Ambulatory Visit (HOSPITAL_COMMUNITY): Payer: Self-pay

## 2021-09-14 ENCOUNTER — Other Ambulatory Visit (HOSPITAL_COMMUNITY): Payer: Self-pay

## 2021-09-20 ENCOUNTER — Other Ambulatory Visit (HOSPITAL_COMMUNITY): Payer: Self-pay

## 2021-09-21 ENCOUNTER — Other Ambulatory Visit (HOSPITAL_COMMUNITY): Payer: Self-pay

## 2021-09-24 ENCOUNTER — Other Ambulatory Visit (HOSPITAL_COMMUNITY): Payer: Self-pay

## 2021-09-25 ENCOUNTER — Other Ambulatory Visit (HOSPITAL_COMMUNITY): Payer: Self-pay

## 2021-10-03 ENCOUNTER — Other Ambulatory Visit (HOSPITAL_COMMUNITY): Payer: Self-pay

## 2021-10-03 MED ORDER — EUTHYROX 150 MCG PO TABS
150.0000 ug | ORAL_TABLET | Freq: Every day | ORAL | 5 refills | Status: DC
  Filled 2021-10-03: qty 30, 30d supply, fill #0
  Filled 2021-11-22: qty 30, 30d supply, fill #1
  Filled 2022-01-23: qty 30, 30d supply, fill #2
  Filled 2022-04-16: qty 30, 30d supply, fill #3
  Filled 2022-07-25: qty 30, 30d supply, fill #4

## 2021-10-04 ENCOUNTER — Other Ambulatory Visit (HOSPITAL_COMMUNITY): Payer: Self-pay

## 2021-10-09 ENCOUNTER — Other Ambulatory Visit (HOSPITAL_COMMUNITY): Payer: Self-pay

## 2021-10-09 MED ORDER — VITAMIN D (ERGOCALCIFEROL) 50000 UNITS PO CAPS
1.0000 | ORAL_CAPSULE | ORAL | 1 refills | Status: DC
  Filled 2021-10-09: qty 4, 28d supply, fill #0
  Filled 2021-11-22: qty 4, 28d supply, fill #1

## 2021-11-22 ENCOUNTER — Other Ambulatory Visit (HOSPITAL_COMMUNITY): Payer: Self-pay

## 2021-11-22 MED ORDER — ATORVASTATIN CALCIUM 10 MG PO TABS
10.0000 mg | ORAL_TABLET | Freq: Every day | ORAL | 2 refills | Status: DC
  Filled 2021-11-22 – 2022-01-23 (×2): qty 30, 30d supply, fill #0
  Filled 2022-04-16: qty 30, 30d supply, fill #1
  Filled 2022-07-04 – 2022-07-25 (×2): qty 30, 30d supply, fill #2
  Filled 2022-10-21: qty 30, 30d supply, fill #3

## 2021-11-22 MED ORDER — HYDROCHLOROTHIAZIDE 12.5 MG PO TABS
12.5000 mg | ORAL_TABLET | Freq: Every morning | ORAL | 2 refills | Status: DC
  Filled 2021-11-22 – 2022-01-23 (×2): qty 30, 30d supply, fill #0
  Filled 2022-04-16: qty 30, 30d supply, fill #1
  Filled 2022-07-04 – 2022-07-25 (×2): qty 30, 30d supply, fill #2
  Filled 2022-10-21: qty 30, 30d supply, fill #3

## 2021-11-22 MED ORDER — METOPROLOL SUCCINATE ER 25 MG PO TB24
25.0000 mg | ORAL_TABLET | Freq: Every day | ORAL | 2 refills | Status: DC
  Filled 2021-11-22: qty 30, 30d supply, fill #0
  Filled 2022-01-23: qty 30, 30d supply, fill #1
  Filled 2022-04-16: qty 30, 30d supply, fill #2
  Filled 2022-07-04 – 2022-07-25 (×2): qty 30, 30d supply, fill #3
  Filled 2022-10-21: qty 30, 30d supply, fill #4

## 2021-11-22 MED ORDER — UBRELVY 100 MG PO TABS
100.0000 mg | ORAL_TABLET | Freq: Every day | ORAL | 0 refills | Status: DC | PRN
  Filled 2021-11-22: qty 15, 8d supply, fill #0
  Filled 2022-01-23: qty 15, 30d supply, fill #0

## 2021-11-23 ENCOUNTER — Other Ambulatory Visit (HOSPITAL_COMMUNITY): Payer: Self-pay

## 2021-11-27 ENCOUNTER — Other Ambulatory Visit (HOSPITAL_COMMUNITY): Payer: Self-pay

## 2021-11-27 MED ORDER — PREDNISONE 10 MG PO TABS
ORAL_TABLET | ORAL | 0 refills | Status: AC
  Filled 2021-11-27: qty 42, 12d supply, fill #0

## 2021-11-27 MED ORDER — ALPRAZOLAM 0.5 MG PO TABS
0.5000 mg | ORAL_TABLET | Freq: Every day | ORAL | 0 refills | Status: DC | PRN
  Filled 2021-11-27: qty 30, 30d supply, fill #0

## 2021-11-27 MED ORDER — AZITHROMYCIN 250 MG PO TABS
ORAL_TABLET | ORAL | 0 refills | Status: AC
  Filled 2021-11-27: qty 6, 5d supply, fill #0

## 2021-11-27 MED ORDER — ARIPIPRAZOLE 2 MG PO TABS
4.0000 mg | ORAL_TABLET | Freq: Every day | ORAL | 1 refills | Status: DC
  Filled 2021-11-27: qty 60, 30d supply, fill #0
  Filled 2022-01-23: qty 60, 30d supply, fill #1
  Filled 2022-04-16: qty 60, 30d supply, fill #2
  Filled 2022-07-04 – 2022-07-29 (×3): qty 60, 30d supply, fill #3
  Filled 2022-10-21: qty 60, 30d supply, fill #4

## 2021-11-27 MED ORDER — PANTOPRAZOLE SODIUM 40 MG PO TBEC
40.0000 mg | DELAYED_RELEASE_TABLET | Freq: Every day | ORAL | 1 refills | Status: DC
  Filled 2021-11-27: qty 30, 30d supply, fill #0
  Filled 2022-01-23: qty 30, 30d supply, fill #1
  Filled 2022-04-16: qty 30, 30d supply, fill #2
  Filled 2022-07-04 – 2022-07-25 (×2): qty 30, 30d supply, fill #3
  Filled 2022-10-21: qty 30, 30d supply, fill #4

## 2021-11-27 MED ORDER — HYDROCOD POLI-CHLORPHE POLI ER 10-8 MG/5ML PO SUER
5.0000 mL | Freq: Two times a day (BID) | ORAL | 0 refills | Status: DC | PRN
  Filled 2021-11-27: qty 100, 10d supply, fill #0

## 2021-11-27 MED ORDER — MONTELUKAST SODIUM 10 MG PO TABS
10.0000 mg | ORAL_TABLET | Freq: Every day | ORAL | 1 refills | Status: DC
  Filled 2021-11-27: qty 90, 90d supply, fill #0
  Filled 2022-01-23 – 2022-04-16 (×2): qty 30, 30d supply, fill #1
  Filled 2022-07-04 – 2022-07-25 (×2): qty 30, 30d supply, fill #2
  Filled 2022-10-21: qty 30, 30d supply, fill #3

## 2021-12-03 ENCOUNTER — Other Ambulatory Visit (HOSPITAL_COMMUNITY): Payer: Self-pay

## 2022-01-17 ENCOUNTER — Other Ambulatory Visit (HOSPITAL_COMMUNITY): Payer: Self-pay

## 2022-01-17 MED ORDER — AIMOVIG 140 MG/ML ~~LOC~~ SOAJ
140.0000 mg | SUBCUTANEOUS | 5 refills | Status: DC
  Filled 2022-01-17: qty 1, 30d supply, fill #0
  Filled 2022-04-16: qty 1, 30d supply, fill #1
  Filled 2022-07-04 – 2022-10-21 (×4): qty 1, 30d supply, fill #2

## 2022-01-23 ENCOUNTER — Other Ambulatory Visit (HOSPITAL_COMMUNITY): Payer: Self-pay

## 2022-01-23 ENCOUNTER — Other Ambulatory Visit: Payer: Self-pay

## 2022-01-24 ENCOUNTER — Other Ambulatory Visit: Payer: Self-pay

## 2022-01-24 ENCOUNTER — Other Ambulatory Visit (HOSPITAL_COMMUNITY): Payer: Self-pay

## 2022-01-24 MED ORDER — DICYCLOMINE HCL 20 MG PO TABS
20.0000 mg | ORAL_TABLET | Freq: Three times a day (TID) | ORAL | 1 refills | Status: DC
  Filled 2022-01-24: qty 90, 30d supply, fill #0
  Filled 2022-04-16: qty 90, 30d supply, fill #1

## 2022-01-24 MED ORDER — FLUTICASONE-SALMETEROL 250-50 MCG/ACT IN AEPB
1.0000 | INHALATION_SPRAY | Freq: Two times a day (BID) | RESPIRATORY_TRACT | 2 refills | Status: DC
  Filled 2022-01-24 – 2022-02-15 (×3): qty 60, 30d supply, fill #0
  Filled 2022-04-16: qty 60, 30d supply, fill #1
  Filled 2022-07-04: qty 60, 30d supply, fill #2

## 2022-01-25 ENCOUNTER — Other Ambulatory Visit (HOSPITAL_COMMUNITY): Payer: Self-pay

## 2022-01-25 MED ORDER — ALPRAZOLAM 0.5 MG PO TABS
0.5000 mg | ORAL_TABLET | Freq: Every day | ORAL | 0 refills | Status: DC | PRN
  Filled 2022-01-25: qty 30, 30d supply, fill #0

## 2022-01-25 MED ORDER — AMITRIPTYLINE HCL 25 MG PO TABS
25.0000 mg | ORAL_TABLET | Freq: Every day | ORAL | 11 refills | Status: DC
  Filled 2022-01-25 – 2022-02-15 (×5): qty 30, 30d supply, fill #0
  Filled 2022-04-16: qty 30, 30d supply, fill #1
  Filled 2022-07-04 – 2022-07-29 (×3): qty 30, 30d supply, fill #2
  Filled 2022-10-21: qty 30, 30d supply, fill #3

## 2022-01-29 ENCOUNTER — Other Ambulatory Visit (HOSPITAL_COMMUNITY): Payer: Self-pay

## 2022-01-30 ENCOUNTER — Other Ambulatory Visit (HOSPITAL_COMMUNITY): Payer: Self-pay

## 2022-02-01 ENCOUNTER — Other Ambulatory Visit (HOSPITAL_COMMUNITY): Payer: Self-pay

## 2022-02-14 ENCOUNTER — Other Ambulatory Visit (HOSPITAL_COMMUNITY): Payer: Self-pay

## 2022-02-15 ENCOUNTER — Other Ambulatory Visit (HOSPITAL_COMMUNITY): Payer: Self-pay

## 2022-02-15 MED ORDER — TIZANIDINE HCL 4 MG PO TABS
4.0000 mg | ORAL_TABLET | Freq: Three times a day (TID) | ORAL | 1 refills | Status: DC
  Filled 2022-02-15: qty 90, 30d supply, fill #0
  Filled 2022-04-16: qty 90, 30d supply, fill #1

## 2022-04-16 ENCOUNTER — Other Ambulatory Visit (HOSPITAL_COMMUNITY): Payer: Self-pay

## 2022-04-16 ENCOUNTER — Other Ambulatory Visit: Payer: Self-pay

## 2022-04-16 MED ORDER — VITAMIN D (ERGOCALCIFEROL) 1.25 MG (50000 UNIT) PO CAPS
50000.0000 [IU] | ORAL_CAPSULE | ORAL | 1 refills | Status: DC
  Filled 2022-04-16: qty 4, 28d supply, fill #0
  Filled 2022-07-04 – 2022-07-25 (×2): qty 4, 28d supply, fill #1

## 2022-04-16 MED ORDER — TAMOXIFEN CITRATE 20 MG PO TABS
20.0000 mg | ORAL_TABLET | Freq: Every day | ORAL | 1 refills | Status: DC
  Filled 2022-04-16: qty 30, 30d supply, fill #0
  Filled 2022-07-04: qty 30, 30d supply, fill #1

## 2022-04-16 MED ORDER — ALBUTEROL SULFATE HFA 108 (90 BASE) MCG/ACT IN AERS
1.0000 | INHALATION_SPRAY | RESPIRATORY_TRACT | 1 refills | Status: AC | PRN
  Filled 2022-04-16: qty 6.7, 33d supply, fill #0

## 2022-04-16 MED ORDER — UBRELVY 100 MG PO TABS
100.0000 mg | ORAL_TABLET | Freq: Every day | ORAL | 1 refills | Status: DC | PRN
  Filled 2022-04-16: qty 15, 30d supply, fill #0
  Filled 2022-07-04 – 2022-07-25 (×2): qty 15, 30d supply, fill #1

## 2022-04-17 ENCOUNTER — Other Ambulatory Visit: Payer: Self-pay

## 2022-04-18 ENCOUNTER — Other Ambulatory Visit (HOSPITAL_COMMUNITY): Payer: Self-pay

## 2022-05-07 ENCOUNTER — Other Ambulatory Visit (HOSPITAL_COMMUNITY): Payer: Self-pay

## 2022-05-07 MED ORDER — TIROSINT-SOL 150 MCG/ML PO SOLN
150.0000 ug | Freq: Every day | ORAL | 5 refills | Status: DC
  Filled 2022-05-07 – 2022-05-09 (×2): qty 30, 30d supply, fill #0

## 2022-05-07 MED ORDER — LOSARTAN POTASSIUM-HCTZ 100-25 MG PO TABS
1.0000 | ORAL_TABLET | Freq: Every day | ORAL | 4 refills | Status: DC
  Filled 2022-05-07: qty 30, 30d supply, fill #0
  Filled 2022-07-04 – 2022-07-25 (×2): qty 30, 30d supply, fill #1
  Filled 2022-10-21: qty 30, 30d supply, fill #2

## 2022-05-09 ENCOUNTER — Other Ambulatory Visit (HOSPITAL_COMMUNITY): Payer: Self-pay

## 2022-05-09 ENCOUNTER — Other Ambulatory Visit: Payer: Self-pay

## 2022-05-09 MED ORDER — AZITHROMYCIN 250 MG PO TABS
ORAL_TABLET | ORAL | 0 refills | Status: DC
  Filled 2022-05-09: qty 6, 5d supply, fill #0

## 2022-05-09 MED ORDER — ZEPBOUND 2.5 MG/0.5ML ~~LOC~~ SOAJ
2.5000 mg | SUBCUTANEOUS | 4 refills | Status: DC
  Filled 2022-05-09: qty 2, 28d supply, fill #0

## 2022-05-09 MED ORDER — PREDNISONE 10 MG PO TABS
10.0000 mg | ORAL_TABLET | ORAL | 0 refills | Status: DC
  Filled 2022-05-09: qty 42, 12d supply, fill #0

## 2022-05-17 ENCOUNTER — Other Ambulatory Visit (HOSPITAL_COMMUNITY): Payer: Self-pay

## 2022-05-17 MED ORDER — THYQUIDITY 100 MCG/5ML PO SOLN
150.0000 ug | Freq: Every day | ORAL | 5 refills | Status: DC
  Filled 2022-05-17: qty 200, 26d supply, fill #0
  Filled 2022-07-04 – 2022-07-25 (×2): qty 200, 26d supply, fill #1
  Filled ????-??-??: fill #2

## 2022-05-20 ENCOUNTER — Other Ambulatory Visit (HOSPITAL_COMMUNITY): Payer: Self-pay

## 2022-05-21 ENCOUNTER — Other Ambulatory Visit (HOSPITAL_COMMUNITY): Payer: Self-pay

## 2022-05-28 ENCOUNTER — Other Ambulatory Visit (HOSPITAL_COMMUNITY): Payer: Self-pay

## 2022-05-28 MED ORDER — MOUNJARO 5 MG/0.5ML ~~LOC~~ SOAJ
5.0000 mg | SUBCUTANEOUS | 4 refills | Status: DC
  Filled 2022-05-28: qty 2, 28d supply, fill #0

## 2022-06-19 ENCOUNTER — Other Ambulatory Visit (HOSPITAL_COMMUNITY): Payer: Self-pay

## 2022-06-19 MED ORDER — WEGOVY 0.25 MG/0.5ML ~~LOC~~ SOAJ
0.2500 mg | SUBCUTANEOUS | 4 refills | Status: DC
  Filled 2022-06-19: qty 0.5, 7d supply, fill #0

## 2022-06-19 MED ORDER — MOUNJARO 5 MG/0.5ML ~~LOC~~ SOAJ
5.0000 mg | SUBCUTANEOUS | 4 refills | Status: DC
  Filled 2022-06-19: qty 0.5, 7d supply, fill #0

## 2022-06-22 ENCOUNTER — Other Ambulatory Visit (HOSPITAL_COMMUNITY): Payer: Self-pay

## 2022-06-25 ENCOUNTER — Other Ambulatory Visit (HOSPITAL_COMMUNITY): Payer: Self-pay

## 2022-06-25 MED ORDER — MOUNJARO 5 MG/0.5ML ~~LOC~~ SOAJ
5.0000 mg | SUBCUTANEOUS | 4 refills | Status: DC
  Filled 2022-06-25: qty 2, 28d supply, fill #0

## 2022-06-25 MED ORDER — MOUNJARO 7.5 MG/0.5ML ~~LOC~~ SOAJ
7.5000 mg | SUBCUTANEOUS | 4 refills | Status: DC
  Filled 2022-06-25: qty 2, 28d supply, fill #0

## 2022-06-27 ENCOUNTER — Other Ambulatory Visit (HOSPITAL_COMMUNITY): Payer: Self-pay

## 2022-06-27 MED ORDER — DICYCLOMINE HCL 20 MG PO TABS
20.0000 mg | ORAL_TABLET | Freq: Three times a day (TID) | ORAL | 1 refills | Status: DC
  Filled 2022-06-27 – 2022-07-25 (×2): qty 90, 30d supply, fill #0
  Filled 2022-10-21: qty 90, 30d supply, fill #1

## 2022-06-28 ENCOUNTER — Other Ambulatory Visit (HOSPITAL_COMMUNITY): Payer: Self-pay

## 2022-06-28 MED ORDER — ALPRAZOLAM 0.5 MG PO TABS
0.5000 mg | ORAL_TABLET | Freq: Every day | ORAL | 0 refills | Status: DC | PRN
  Filled 2022-06-28: qty 15, 15d supply, fill #0
  Filled 2022-07-25: qty 15, 30d supply, fill #0

## 2022-07-04 ENCOUNTER — Other Ambulatory Visit (HOSPITAL_COMMUNITY): Payer: Self-pay

## 2022-07-05 ENCOUNTER — Other Ambulatory Visit: Payer: Self-pay

## 2022-07-05 ENCOUNTER — Other Ambulatory Visit (HOSPITAL_COMMUNITY): Payer: Self-pay

## 2022-07-08 ENCOUNTER — Other Ambulatory Visit (HOSPITAL_COMMUNITY): Payer: Self-pay

## 2022-07-08 ENCOUNTER — Other Ambulatory Visit: Payer: Self-pay

## 2022-07-09 ENCOUNTER — Other Ambulatory Visit (HOSPITAL_COMMUNITY): Payer: Self-pay

## 2022-07-11 ENCOUNTER — Other Ambulatory Visit (HOSPITAL_COMMUNITY): Payer: Self-pay

## 2022-07-16 ENCOUNTER — Other Ambulatory Visit: Payer: Self-pay

## 2022-07-17 ENCOUNTER — Other Ambulatory Visit (HOSPITAL_COMMUNITY): Payer: Self-pay

## 2022-07-17 MED ORDER — TAMOXIFEN CITRATE 20 MG PO TABS
20.0000 mg | ORAL_TABLET | Freq: Every day | ORAL | 3 refills | Status: DC
  Filled 2022-07-17: qty 90, 90d supply, fill #0
  Filled 2022-07-25: qty 30, 30d supply, fill #0
  Filled 2022-10-21: qty 30, 30d supply, fill #1
  Filled 2023-04-11: qty 30, 30d supply, fill #2
  Filled 2023-05-27 (×2): qty 30, 30d supply, fill #3

## 2022-07-22 ENCOUNTER — Other Ambulatory Visit (HOSPITAL_COMMUNITY): Payer: Self-pay

## 2022-07-24 ENCOUNTER — Other Ambulatory Visit (HOSPITAL_COMMUNITY): Payer: Self-pay

## 2022-07-25 ENCOUNTER — Other Ambulatory Visit (HOSPITAL_COMMUNITY): Payer: Self-pay

## 2022-07-26 ENCOUNTER — Other Ambulatory Visit: Payer: Self-pay

## 2022-07-26 ENCOUNTER — Other Ambulatory Visit (HOSPITAL_COMMUNITY): Payer: Self-pay

## 2022-07-29 ENCOUNTER — Other Ambulatory Visit: Payer: Self-pay

## 2022-07-29 ENCOUNTER — Other Ambulatory Visit (HOSPITAL_COMMUNITY): Payer: Self-pay

## 2022-08-19 ENCOUNTER — Other Ambulatory Visit (HOSPITAL_COMMUNITY): Payer: Self-pay

## 2022-08-28 ENCOUNTER — Other Ambulatory Visit (HOSPITAL_COMMUNITY): Payer: Self-pay

## 2022-08-28 MED ORDER — SUMATRIPTAN SUCCINATE 50 MG PO TABS
ORAL_TABLET | ORAL | 5 refills | Status: DC
  Filled 2022-08-28: qty 12, 30d supply, fill #0
  Filled 2022-10-21: qty 12, 30d supply, fill #1
  Filled 2023-04-11: qty 12, 30d supply, fill #2
  Filled 2023-05-27 (×2): qty 12, 30d supply, fill #3

## 2022-08-28 MED ORDER — PREDNISONE 10 MG PO TABS
ORAL_TABLET | ORAL | 0 refills | Status: DC
  Filled 2022-08-28: qty 42, 12d supply, fill #0

## 2022-08-29 ENCOUNTER — Other Ambulatory Visit (HOSPITAL_COMMUNITY): Payer: Self-pay

## 2022-08-29 MED ORDER — ONDANSETRON HCL 4 MG PO TABS
4.0000 mg | ORAL_TABLET | Freq: Every day | ORAL | 2 refills | Status: AC
  Filled 2022-08-29: qty 30, 30d supply, fill #0
  Filled 2022-10-21: qty 30, 30d supply, fill #1
  Filled 2023-04-11: qty 30, 30d supply, fill #2

## 2022-09-03 ENCOUNTER — Other Ambulatory Visit (HOSPITAL_COMMUNITY): Payer: Self-pay

## 2022-09-03 MED ORDER — WEGOVY 1 MG/0.5ML ~~LOC~~ SOAJ
1.0000 mg | SUBCUTANEOUS | 0 refills | Status: DC
  Filled 2022-09-03: qty 2, 28d supply, fill #0

## 2022-09-23 ENCOUNTER — Other Ambulatory Visit (HOSPITAL_COMMUNITY): Payer: Self-pay

## 2022-09-23 MED ORDER — BUDESONIDE-FORMOTEROL FUMARATE 160-4.5 MCG/ACT IN AERO
2.0000 | INHALATION_SPRAY | Freq: Two times a day (BID) | RESPIRATORY_TRACT | 5 refills | Status: DC
  Filled 2022-09-23: qty 10.2, 30d supply, fill #0

## 2022-09-25 ENCOUNTER — Other Ambulatory Visit (HOSPITAL_COMMUNITY): Payer: Self-pay

## 2022-10-10 ENCOUNTER — Other Ambulatory Visit (HOSPITAL_COMMUNITY): Payer: Self-pay

## 2022-10-15 ENCOUNTER — Other Ambulatory Visit (HOSPITAL_COMMUNITY): Payer: Self-pay

## 2022-10-16 ENCOUNTER — Other Ambulatory Visit (HOSPITAL_COMMUNITY): Payer: Self-pay

## 2022-10-16 MED ORDER — LEVOTHYROXINE SODIUM 175 MCG PO TABS
175.0000 ug | ORAL_TABLET | Freq: Every day | ORAL | 5 refills | Status: DC
  Filled 2022-10-16: qty 30, 30d supply, fill #0

## 2022-10-17 ENCOUNTER — Other Ambulatory Visit (HOSPITAL_COMMUNITY): Payer: Self-pay

## 2022-10-18 ENCOUNTER — Other Ambulatory Visit (HOSPITAL_COMMUNITY): Payer: Self-pay

## 2022-10-18 MED ORDER — NA SULFATE-K SULFATE-MG SULF 17.5-3.13-1.6 GM/177ML PO SOLN
ORAL | 0 refills | Status: DC
  Filled 2022-10-18: qty 354, 1d supply, fill #0

## 2022-10-21 ENCOUNTER — Other Ambulatory Visit: Payer: Self-pay

## 2022-10-21 ENCOUNTER — Other Ambulatory Visit (HOSPITAL_COMMUNITY): Payer: Self-pay

## 2022-10-23 ENCOUNTER — Other Ambulatory Visit (HOSPITAL_COMMUNITY): Payer: Self-pay

## 2022-11-14 ENCOUNTER — Other Ambulatory Visit (HOSPITAL_COMMUNITY): Payer: Self-pay

## 2022-11-14 MED ORDER — LEVOTHYROXINE SODIUM 175 MCG PO TABS
350.0000 ug | ORAL_TABLET | ORAL | 5 refills | Status: DC
  Filled 2022-11-14: qty 30, 30d supply, fill #0
  Filled 2023-04-11: qty 30, 30d supply, fill #1
  Filled 2023-05-22 (×2): qty 30, 30d supply, fill #2

## 2022-11-15 ENCOUNTER — Other Ambulatory Visit (HOSPITAL_COMMUNITY): Payer: Self-pay

## 2022-11-18 ENCOUNTER — Other Ambulatory Visit (HOSPITAL_COMMUNITY): Payer: Self-pay

## 2023-01-08 ENCOUNTER — Other Ambulatory Visit (HOSPITAL_COMMUNITY): Payer: Self-pay

## 2023-01-08 MED ORDER — DULERA 200-5 MCG/ACT IN AERO
1.0000 | INHALATION_SPRAY | Freq: Two times a day (BID) | RESPIRATORY_TRACT | 5 refills | Status: DC
  Filled 2023-01-08: qty 13, 30d supply, fill #0

## 2023-01-20 ENCOUNTER — Other Ambulatory Visit (HOSPITAL_COMMUNITY): Payer: Self-pay

## 2023-02-05 ENCOUNTER — Other Ambulatory Visit: Payer: Self-pay

## 2023-02-05 ENCOUNTER — Other Ambulatory Visit (HOSPITAL_COMMUNITY): Payer: Self-pay

## 2023-02-05 MED ORDER — DULERA 100-5 MCG/ACT IN AERO
2.0000 | INHALATION_SPRAY | Freq: Two times a day (BID) | RESPIRATORY_TRACT | 5 refills | Status: DC
  Filled 2023-02-05: qty 13, 30d supply, fill #0

## 2023-02-05 MED ORDER — FLUTICASONE PROPIONATE 50 MCG/ACT NA SUSP
1.0000 | Freq: Every day | NASAL | 5 refills | Status: AC
  Filled 2023-02-05: qty 16, 30d supply, fill #0
  Filled 2023-04-11: qty 16, 30d supply, fill #1

## 2023-02-11 ENCOUNTER — Encounter (HOSPITAL_COMMUNITY): Payer: Self-pay

## 2023-02-11 ENCOUNTER — Other Ambulatory Visit (HOSPITAL_COMMUNITY): Payer: Self-pay

## 2023-02-12 ENCOUNTER — Other Ambulatory Visit (HOSPITAL_COMMUNITY): Payer: Self-pay

## 2023-02-13 ENCOUNTER — Other Ambulatory Visit (HOSPITAL_COMMUNITY): Payer: Self-pay

## 2023-02-13 MED ORDER — TIZANIDINE HCL 4 MG PO TABS
4.0000 mg | ORAL_TABLET | Freq: Three times a day (TID) | ORAL | 1 refills | Status: AC
  Filled 2023-02-13: qty 90, 30d supply, fill #0
  Filled 2023-04-11: qty 90, 30d supply, fill #1

## 2023-03-10 ENCOUNTER — Other Ambulatory Visit (HOSPITAL_COMMUNITY): Payer: Self-pay

## 2023-03-10 MED ORDER — DULERA 100-5 MCG/ACT IN AERO
2.0000 | INHALATION_SPRAY | Freq: Two times a day (BID) | RESPIRATORY_TRACT | 5 refills | Status: DC
  Filled 2023-03-10: qty 13, 30d supply, fill #0

## 2023-03-19 ENCOUNTER — Other Ambulatory Visit (HOSPITAL_COMMUNITY): Payer: Self-pay

## 2023-04-10 ENCOUNTER — Other Ambulatory Visit (HOSPITAL_COMMUNITY): Payer: Self-pay

## 2023-04-11 ENCOUNTER — Other Ambulatory Visit (HOSPITAL_COMMUNITY): Payer: Self-pay

## 2023-04-11 MED ORDER — UBRELVY 100 MG PO TABS
100.0000 mg | ORAL_TABLET | Freq: Every day | ORAL | 1 refills | Status: DC | PRN
  Filled 2023-04-11: qty 15, 30d supply, fill #0
  Filled 2023-05-27: qty 15, 30d supply, fill #1

## 2023-04-11 MED ORDER — PREDNISONE 10 MG PO TABS
ORAL_TABLET | ORAL | 0 refills | Status: AC
  Filled 2023-04-11 – 2023-04-14 (×3): qty 21, 6d supply, fill #0

## 2023-04-14 ENCOUNTER — Other Ambulatory Visit (HOSPITAL_COMMUNITY): Payer: Self-pay

## 2023-04-14 ENCOUNTER — Encounter (HOSPITAL_COMMUNITY): Payer: Self-pay

## 2023-04-15 ENCOUNTER — Encounter (HOSPITAL_COMMUNITY): Payer: Self-pay

## 2023-04-15 ENCOUNTER — Other Ambulatory Visit (HOSPITAL_COMMUNITY): Payer: Self-pay

## 2023-04-15 MED ORDER — AIMOVIG 140 MG/ML ~~LOC~~ SOAJ
140.0000 mg | SUBCUTANEOUS | 5 refills | Status: AC
Start: 2023-04-15 — End: ?
  Filled 2023-04-15: qty 1, 30d supply, fill #0
  Filled 2023-05-27 (×2): qty 1, 30d supply, fill #1

## 2023-04-15 MED ORDER — AMITRIPTYLINE HCL 25 MG PO TABS
25.0000 mg | ORAL_TABLET | Freq: Every day | ORAL | 11 refills | Status: DC
  Filled 2023-04-15: qty 30, 30d supply, fill #0
  Filled 2023-05-27 (×2): qty 30, 30d supply, fill #1

## 2023-04-15 MED ORDER — MONTELUKAST SODIUM 10 MG PO TABS
10.0000 mg | ORAL_TABLET | Freq: Every day | ORAL | 0 refills | Status: DC
  Filled 2023-04-15: qty 30, 30d supply, fill #0
  Filled 2023-05-27 (×2): qty 30, 30d supply, fill #1

## 2023-04-15 MED ORDER — HYDROCHLOROTHIAZIDE 12.5 MG PO TABS
12.5000 mg | ORAL_TABLET | Freq: Every day | ORAL | 0 refills | Status: DC
  Filled 2023-04-15: qty 30, 30d supply, fill #0
  Filled 2023-05-27 (×2): qty 30, 30d supply, fill #1

## 2023-04-15 MED ORDER — PANTOPRAZOLE SODIUM 40 MG PO TBEC
40.0000 mg | DELAYED_RELEASE_TABLET | Freq: Every day | ORAL | 0 refills | Status: AC
  Filled 2023-04-15: qty 30, 30d supply, fill #0
  Filled 2023-05-27 (×2): qty 30, 30d supply, fill #1

## 2023-04-15 MED ORDER — METOPROLOL SUCCINATE ER 25 MG PO TB24
25.0000 mg | ORAL_TABLET | Freq: Every day | ORAL | 0 refills | Status: DC
  Filled 2023-04-15: qty 30, 30d supply, fill #0
  Filled 2023-05-27 (×2): qty 30, 30d supply, fill #1

## 2023-04-17 ENCOUNTER — Other Ambulatory Visit (HOSPITAL_COMMUNITY): Payer: Self-pay

## 2023-05-01 ENCOUNTER — Other Ambulatory Visit (HOSPITAL_COMMUNITY): Payer: Self-pay

## 2023-05-01 MED ORDER — LEVOTHYROXINE SODIUM 175 MCG PO TABS
1225.0000 ug | ORAL_TABLET | ORAL | 3 refills | Status: DC
  Filled 2023-05-01: qty 90, 84d supply, fill #0
  Filled 2023-05-06: qty 30, 28d supply, fill #0

## 2023-05-06 ENCOUNTER — Other Ambulatory Visit (HOSPITAL_COMMUNITY): Payer: Self-pay

## 2023-05-07 ENCOUNTER — Other Ambulatory Visit (HOSPITAL_COMMUNITY): Payer: Self-pay

## 2023-05-20 ENCOUNTER — Other Ambulatory Visit (HOSPITAL_COMMUNITY): Payer: Self-pay

## 2023-05-22 ENCOUNTER — Other Ambulatory Visit (HOSPITAL_COMMUNITY): Payer: Self-pay

## 2023-05-27 ENCOUNTER — Other Ambulatory Visit (HOSPITAL_COMMUNITY): Payer: Self-pay

## 2023-05-28 ENCOUNTER — Other Ambulatory Visit (HOSPITAL_COMMUNITY): Payer: Self-pay

## 2023-05-28 ENCOUNTER — Other Ambulatory Visit: Payer: Self-pay

## 2023-05-28 MED ORDER — ALPRAZOLAM 0.5 MG PO TABS
0.5000 mg | ORAL_TABLET | Freq: Every evening | ORAL | 0 refills | Status: DC | PRN
  Filled 2023-05-28: qty 30, 30d supply, fill #0

## 2023-05-28 MED ORDER — MONTELUKAST SODIUM 10 MG PO TABS
10.0000 mg | ORAL_TABLET | Freq: Every day | ORAL | 0 refills | Status: DC
  Filled 2023-05-28: qty 90, 90d supply, fill #0

## 2023-05-29 ENCOUNTER — Other Ambulatory Visit (HOSPITAL_COMMUNITY): Payer: Self-pay

## 2023-05-29 MED ORDER — MONTELUKAST SODIUM 10 MG PO TABS: 10.0000 mg | ORAL_TABLET | Freq: Every day | ORAL | 0 refills | Status: AC

## 2023-06-02 ENCOUNTER — Other Ambulatory Visit (HOSPITAL_COMMUNITY): Payer: Self-pay

## 2023-06-03 ENCOUNTER — Other Ambulatory Visit (HOSPITAL_COMMUNITY): Payer: Self-pay

## 2023-06-03 MED ORDER — SULFAMETHOXAZOLE-TRIMETHOPRIM 800-160 MG PO TABS
1.0000 | ORAL_TABLET | Freq: Two times a day (BID) | ORAL | 0 refills | Status: AC
  Filled 2023-06-03: qty 20, 10d supply, fill #0

## 2023-06-24 ENCOUNTER — Other Ambulatory Visit: Payer: Self-pay

## 2023-10-27 ENCOUNTER — Other Ambulatory Visit: Payer: Self-pay

## 2023-10-27 ENCOUNTER — Other Ambulatory Visit (HOSPITAL_COMMUNITY): Payer: Self-pay

## 2023-10-27 MED ORDER — ATORVASTATIN CALCIUM 20 MG PO TABS
20.0000 mg | ORAL_TABLET | Freq: Every day | ORAL | 5 refills | Status: DC
  Filled 2023-10-27 – 2023-11-06 (×2): qty 30, 30d supply, fill #0

## 2023-10-27 MED ORDER — METOPROLOL SUCCINATE ER 25 MG PO TB24
25.0000 mg | ORAL_TABLET | Freq: Every day | ORAL | 3 refills | Status: DC
  Filled 2023-10-27 – 2023-11-06 (×2): qty 30, 30d supply, fill #0

## 2023-10-27 MED ORDER — HYDROCHLOROTHIAZIDE 25 MG PO TABS
25.0000 mg | ORAL_TABLET | Freq: Every day | ORAL | 3 refills | Status: DC
  Filled 2023-10-27 – 2023-11-06 (×2): qty 30, 30d supply, fill #0

## 2023-11-06 ENCOUNTER — Other Ambulatory Visit (HOSPITAL_COMMUNITY): Payer: Self-pay

## 2023-11-10 ENCOUNTER — Other Ambulatory Visit (HOSPITAL_COMMUNITY): Payer: Self-pay

## 2023-11-10 MED ORDER — SCOPOLAMINE 1 MG/3DAYS TD PT72
1.0000 | MEDICATED_PATCH | Freq: Once | TRANSDERMAL | 0 refills | Status: AC
  Filled 2023-11-10: qty 1, 3d supply, fill #0

## 2023-11-21 ENCOUNTER — Other Ambulatory Visit (HOSPITAL_COMMUNITY): Payer: Self-pay

## 2023-12-01 NOTE — Progress Notes (Signed)
 Date of service: 12/01/2023  PREOPERATIVE DIAGNOSIS:  Carpal tunnel syndrome, chronic  POSTOPERATIVE DIAGNOSIS:      Carpal tunnel syndrome, chronic  PROCEDURE: CARPAL TUNNEL INJECTION, BILATERAL USING ULTRASOUND GUIDANCE  BLOOD LOSS: minimal  COMPLICATIONS: none  THERAPEUTIC AGENT:  1cc 0.25% Bupivacaine  + 20mg  kenalog  per side  DESCRIPTION:   After obtaining informed consent, the patient was positioned on the padded procedure table. The skin overlying the carpal tunnel was prepped and draped in the usual fashion.  Ultrasound was utilized to identify a site for injection. A sterile block needle was advanced through the skin into the carpal tunnel. Aspiration and test dose were negative. The therapeutic agent was administered in increments. The needle was removed intact. The identical procedure was then performed on the opposite side of the body with a new sterile needle.  The patient tolerated the procedure well and recovered uneventfully in the pain center. Discharge instructions were given. There were no complications. The procedure was performed as stated above.  ADDENDUM:

## 2023-12-17 NOTE — Progress Notes (Signed)
 Chief Complaint: Chief Complaint  Patient presents with  . Post-op    CYSTOSCOPY WITH BILATERAL RETROGRADE PYELOGRAMS, BOTOX 100 UNITS, FULFURATION OF BLADDER       History of Present Illness: Vanessa Berg is a 64 y.o. female with a history of the following:  S/p Botox and fulguration of bladder diverticulum  performed Oct 23rd  in today for visit. Her frequency and urgency has improved about 40 % better.  She feels like the pelvic pressure is not less.  She has had  sling APR in past for gyn ; Heating pad helps with relief of pelvic pressure; Recently has  had treatment with antibiotics for pulmonary infection.   Past Medical History: Past Medical History:  Diagnosis Date  . Allergy    . Anxiety   . Arthritis   . Asthma (*)   . Coronary artery disease   . GERD (gastroesophageal reflux disease)   . Heart disease   . Hyperlipidemia   . Hypertension   . Hypothyroidism   . Migraine   . PONV (postoperative nausea and vomiting)     Past Surgical History: Past Surgical History:  Procedure Laterality Date  . Ankle surgery    . Bilateral salpingoophorectomy     cystoceal and rectoceal surgery  . Cholecystectomy    . Colonoscopy     unknown  . Foot surgery    . Hysterectomy    . Knee surgery    . Upper gastrointestinal endoscopy     unknown    Medications: Medications Ordered Prior to Encounter[1]  Allergies: Amoxicillin , Ana-kit bee sting, Bee venom, Ibuprofen, Morphine  and related, Nsaids, Acetaminophen , Duloxetine  hcl, Latex, Penicillin g, Rosuvastatin , and Sertraline hcl  Family History: Family History  Problem Relation Age of Onset  . Cancer Mother        gastric  . COPD Mother   . Heart disease Mother   . Breast cancer Mother 41  . Asthma Mother   . Diabetes Mother   . Stroke Mother   . Esophageal cancer Father 42  . Cancer Father   . Heart disease Sister   . Heart disease Sister   . COPD Sister   . No Known Problems Sister   .  Cancer Brother 42       bone cancer  . Heart disease Brother   . No Known Problems Daughter   . No Known Problems Daughter   . No Known Problems Son   . Breast cancer Maternal Aunt 40  . Breast cancer Maternal Aunt 36  . Breast cancer Maternal Aunt 54  . Colon cancer Maternal Uncle 65  . Breast cancer Paternal Aunt 45  . Breast cancer Paternal Aunt 42  . Breast cancer Maternal Grandmother 34  . Diabetes Maternal Grandmother   . Heart disease Maternal Grandmother   . Heart disease Paternal Grandmother   . Leukemia Cousin 42  . Heart disease Maternal Grandfather   . Stroke Maternal Grandfather   . Diabetes Paternal Grandfather   . Heart disease Paternal Grandfather   . COPD Sister   . Heart disease Sister     Social History: Tobacco Use History[2]    Social History   Substance and Sexual Activity  Alcohol Use Not Currently    Social History   Substance and Sexual Activity  Drug Use Never    Review of Systems:  All systems reviewed and otherwise negative except as documented above.   Physical Exam  CONSTITUTIONAL: This a 63 y.o. female in  no acute distress.   Vitals:   12/17/23 0818  BP: 120/80   PSYCHOLOGIC:  Awake, alert and oriented to person, place and time. Normal mood and affect DERMATOLOGIC: Skin warm and dry RESPIRATORY: Respiratory effort normal CARDIOVASCULAR:  No cyanosis GASTROINTESTINAL: Abdomen is non-distended  NEUROLOGIC: no gross motor dysfunction GENITOURINARY:   - Kidneys normal. No CVA tenderness  - Bladder non-tender and not distended.  Laboratory Values: No visits with results within 1 Day(s) from this visit.  Latest known visit with results is:  Lab on 10/03/2023  Component Date Value Ref Range Status  . Cholesterol, Total 10/03/2023 230 (H)  100 - 199 mg/dL Final  . Triglycerides 10/03/2023 102  0 - 149 mg/dL Final  . HDL 90/94/7974 65  >39 mg/dL Final  . VLDL Cholesterol Cal 10/03/2023 18  5 - 40 mg/dL Final  . LDL 90/94/7974  147 (H)  0 - 99 mg/dL Final  . LDL/HDL Ratio 10/03/2023 2.3  0.0 - 3.2 ratio Final  . Specific Gravity, UA 10/03/2023 1.029  1.005 - 1.03 Final  . pH, UA 10/03/2023 5.5  5.0 - 7.5 Final  . UA Color 10/03/2023 Yellow  Yellow Final  . Urine Appearance 10/03/2023 Cloudy (A)  Clear Final  . Leukocytes, UA 10/03/2023 1+ (A)  Negative Final  . Protein, UA 10/03/2023 Trace  Negative/T Final  . Glucose, UA 10/03/2023 Negative  Negative Final  . Ketones, UA 10/03/2023 Trace (A)  Negative Final  . UA Blood 10/03/2023 Negative  Negative Final  . Bilirubin, UA 10/03/2023 TNP   Final  . UA Urobilinogen 10/03/2023 1.0  0.2 - 1.0 mg/dL Final  . Nitrite, Urine 10/03/2023 Negative  Negative Final  . UA Microscopic 10/03/2023 See below:   Final  . Glucose 10/03/2023 72  70 - 99 mg/dL Final  . BUN 90/94/7974 11  8 - 27 mg/dL Final  . Creatinine 90/94/7974 0.89  0.57 - 1.00 mg/dL Final  . eGFR 90/94/7974 73  >59 mL/min/1.73 Final  . BUN/Creatinine Ratio 10/03/2023 12  12 - 28 Final  . Sodium 10/03/2023 140  134 - 144 mmol/L Final  . Potassium 10/03/2023 3.9  3.5 - 5.2 mmol/L Final  . Chloride 10/03/2023 101  96 - 106 mmol/L Final  . CO2 10/03/2023 24  20 - 29 mmol/L Final  . CALCIUM  10/03/2023 9.6  8.7 - 10.3 mg/dL Final  . Total Protein 10/03/2023 6.6  6.0 - 8.5 g/dL Final  . Albumin, Serum 10/03/2023 4.3  3.9 - 4.9 g/dL Final  . Globulin, Total 10/03/2023 2.3  1.5 - 4.5 g/dL Final  . Total Bilirubin 10/03/2023 0.6  0.0 - 1.2 mg/dL Final  . Alkaline Phosphatase 10/03/2023 96  44 - 121 IU/L Final  . AST 10/03/2023 19  0 - 40 IU/L Final  . ALT (SGPT) 10/03/2023 10  0 - 32 IU/L Final  . WBC 10/03/2023 6.9  3.4 - 10.8 x10E3/uL Final  . RBC 10/03/2023 4.81  3.77 - 5.28 x10E6/uL Final  . Hemoglobin 10/03/2023 14.5  11.1 - 15.9 g/dL Final  . Hematocrit 90/94/7974 45.0  34.0 - 46.6 % Final  . MCV 10/03/2023 94  79 - 97 fL Final  . MCH 10/03/2023 30.1  26.6 - 33.0 pg Final  . MCHC 10/03/2023 32.2  31.5 -  35.7 g/dL Final  . RDW 90/94/7974 13.6  11.7 - 15.4 % Final  . Platelet Count 10/03/2023 270  150 - 450 x10E3/uL Final  . Neutrophils 10/03/2023 54  Not Estab. %  Final  . Lymphs Relative 10/03/2023 37  Not Estab. % Final  . Monocytes 10/03/2023 7  Not Estab. % Final  . Eos Relative 10/03/2023 1  Not Estab. % Final  . Basos Relative  10/03/2023 1  Not Estab. % Final  . Neutrophils Absolute 10/03/2023 3.7  1.4 - 7.0 x10E3/uL Final  . Lymphocytes Absolute 10/03/2023 2.6  0.7 - 3.1 x10E3/uL Final  . Monocytes Absolute 10/03/2023 0.5  0.1 - 0.9 x10E3/uL Final  . Eosinophils Absolute 10/03/2023 0.1  0.0 - 0.4 x10E3/uL Final  . Basophils Absolute 10/03/2023 0.1  0.0 - 0.2 x10E3/uL Final  . Immature Granulocytes 10/03/2023 0  Not Estab. % Final  . Immature Grans (Abs) 10/03/2023 0.0  0.0 - 0.1 x10E3/uL Final  . WBC, UA 10/03/2023 0-5  0 - 5 /hpf Final  . RBC, UA 10/03/2023 0-2  0 - 2 /hpf Final  . UA Epithelials 10/03/2023 >10 (A)  0 - 10 /hpf Final  . Casts 10/03/2023 None seen  None seen /lpf Final  . Bacteria,UA 10/03/2023 Few  None seen/ Final    Notable Labs reviewed:  No results found for: PSA Lab Results  Component Value Date   Creatinine 0.89 10/03/2023   No results found for: TESTOSTERONE  Radiology Results:  FL Retrograde Pyelogram Result Date: 11/20/2023 INDICATION: Bladder diverticulum COMPARISON: CT 11/18/2022 TECHNIQUE: 2 fluoroscopic spot images were provided for interpretation. 0.4 minutes of fluoroscopy time was documented. REFERENCED CUMULATIVE AIR KERMA: 2.3 mGy FINDINGS: Bilateral retrograde, Gram stain without hydronephrosis. Small air bubbles noted. Please refer to the procedure note for complete details.   IMPRESSION: Intraoperative fluoroscopic guidance. Electronically Signed by: Bernard LULLA Blanch MD, MBA on 11/20/2023 2:25 PM  CYSTOSCOPY W/ RETROGRADES Result Date: 11/20/2023 This order was auto-finalized. Please see relevant op note for procedure  result.   Assessment and Plan: Vanessa Berg is a 62 y.o. female with the following diagnoses:   1. Urge incontinence 2.  Pelvic pressure   Our plan is as follows:      Will consider dose escalation to 200 units in 4 months   This note was partially dictated with voice recognition software. Similar sounding words can inadequately be transcribed and may not be corrected upon review.        [1] Current Outpatient Medications on File Prior to Visit  Medication Sig Dispense Refill  . albuterol  sulfate HFA (PROVENTIL ,VENTOLIN ,PROAIR ) 108 (90 Base) MCG/ACT inhaler every 4 (four) hours.    . ALPRAZolam  (XANAX ) 0.5 mg tablet Take one tablet (0.5 mg dose) by mouth at bedtime as needed for Sleep. Max Daily Amount: 0.5 mg 30 tablet 0  . amitriptyline  HCl (ELAVIL ) 25 mg tablet Take 1 tablet (25 mg total) by mouth at bedtime. 30 tablet 11  . ARIPiprazole  (ABILIFY ) 2 mg tablet Take one tablet (2 mg dose) by mouth at bedtime.    . Ascorbic Acid 125 MG CHEW Chew 1 capsule by mouth daily.    . aspirin  81 mg chewable tablet 1 tab    . atorvastatin  (LIPITOR) 20 mg tablet Take one tablet (20 mg dose) by mouth daily. (Patient taking differently: Take one tablet (20 mg dose) by mouth at bedtime.) 30 tablet 5  . benzonatate  (TESSALON  PERLES) 100 mg capsule Take one capsule (100 mg dose) by mouth 3 (three) times a day as needed for Cough. 90 capsule 0  . budesonide -formoterol  (BREYNA ) 80-4.5 mcg/actuation inhaler Inhale two puffs into the lungs 2 (two) times daily. 10.2 g 5  .  busPIRone (BUSPAR) 10 mg tablet Take one tablet (10 mg dose) by mouth 2 (two) times daily. 60 tablet 2  . dicyclomine  (BENTYL ) 20 mg tablet Take one tablet (20 mg dose) by mouth 3 (three) times a day.    . doxycycline  hyclate (VIBRA -TABS) 100 mg tablet Take one tablet (100 mg dose) by mouth 2 (two) times daily for 10 days. 20 tablet 0  . Erenumab -aooe (AIMOVIG ) 140 MG/ML SOAJ Inject 140 mg into the skin every 30  (thirty) days. 3 mL 3  . ergocalciferol  (VITAMIN D2) 50,000 units CAPS capsule Take one capsule (50,000 Units dose) by mouth 2 (two) times a week. 5 capsule 0  . fluticasone  propionate (FLONASE ) 50 mcg/actuation nasal spray one spray by Nasal route daily. 16 g 5  . hydroCHLOROthiazide  25 mg tablet Take one tablet (25 mg dose) by mouth daily. 90 tablet 3  . levothyroxine  sodium (SYNTHROID ,LEVOTHROID,LEVOXYL ) 175 mcg tablet Take seven tablets (1,225 mcg dose) by mouth once a week. 90 tablet 3  . metoprolol  succinate (TOPROL -XL) 25 mg 24 hr tablet Take one tablet (25 mg dose) by mouth daily. Last refill needs follow up (Patient taking differently: Take one tablet (25 mg dose) by mouth every morning. Last refill needs follow up) 90 tablet 3  . montelukast  (SINGULAIR ) 10 MG tablet Take one tablet (10 mg dose) by mouth daily. (Patient taking differently: Take one tablet (10 mg dose) by mouth at bedtime.) 90 tablet 0  . Multiple Vitamin (MULTI VITAMIN) TABS 1 tablet    . ondansetron  (ZOFRAN ) 4 mg tablet Take one tablet (4 mg dose) by mouth daily. 20 tablet 3  . pantoprazole  sodium (PROTONIX ) 40 mg tablet Take one tablet (40 mg dose) by mouth daily. Last refill needs follow up 90 tablet 0  . predniSONE  (DELTASONE ) 20 mg tablet Take one tablet (20 mg dose) by mouth daily for 10 days. 10 tablet 0  . pregabalin  (LYRICA ) 50 mg capsule Take one capsule (50 mg dose) by mouth 3 (three) times a day. Max Daily Amount: 150 mg 90 capsule 2  . scopolamine  (TRANSDERM-SCOP 1.5 MG) 1 MG/3DAYS patch Place one patch onto the skin every third day.    . tamoxifen  (NOLVADEX ) 20 MG tablet Take one tablet (20 mg dose) by mouth daily. (Patient taking differently: Take one tablet (20 mg dose) by mouth every morning.) 90 tablet 0  . tirzepatide -weight management (ZEPBOUND ) 15 mg/0.5 mL SOAJ injection Inject 0.5 mLs (15 mg dose) into the skin once a week. 2 mL 3  . tiZANidine  (ZANAFLEX ) 4 mg tablet Take one tablet (4 mg dose) by mouth  3 (three) times a day. 30 tablet 1  . tolterodine tartrate (DETROL LA) 4 mg 24 hr capsule Take one capsule (4 mg dose) by mouth daily. 30 capsule 0  . triamcinolone  acetonide (KENALOG ) 0.1% cream Apply topically 2 (two) times daily. 15 g 0   No current facility-administered medications on file prior to visit.  [2] Social History Tobacco Use  Smoking Status Former  . Current packs/day: 0.00  . Average packs/day: 0.5 packs/day for 35.0 years (17.5 ttl pk-yrs)  . Types: Cigarettes  . Start date: 83  . Quit date: 2009  . Years since quitting: 16.8  . Passive exposure: Past  Smokeless Tobacco Never

## 2023-12-30 ENCOUNTER — Other Ambulatory Visit (HOSPITAL_COMMUNITY): Payer: Self-pay

## 2023-12-30 MED ORDER — METOPROLOL SUCCINATE ER 25 MG PO TB24
25.0000 mg | ORAL_TABLET | Freq: Every day | ORAL | 3 refills | Status: DC
  Filled 2023-12-30: qty 30, 30d supply, fill #0

## 2023-12-30 MED ORDER — ATORVASTATIN CALCIUM 20 MG PO TABS
20.0000 mg | ORAL_TABLET | Freq: Every day | ORAL | 5 refills | Status: DC
  Filled 2023-12-30: qty 30, 30d supply, fill #0

## 2023-12-30 MED ORDER — HYDROCHLOROTHIAZIDE 25 MG PO TABS
25.0000 mg | ORAL_TABLET | Freq: Every day | ORAL | 3 refills | Status: DC
  Filled 2023-12-30: qty 30, 30d supply, fill #0

## 2023-12-31 ENCOUNTER — Other Ambulatory Visit (HOSPITAL_COMMUNITY): Payer: Self-pay

## 2023-12-31 MED ORDER — AMITRIPTYLINE HCL 25 MG PO TABS
25.0000 mg | ORAL_TABLET | Freq: Every day | ORAL | 11 refills | Status: AC
  Filled 2023-12-31: qty 30, 30d supply, fill #0

## 2024-01-01 ENCOUNTER — Other Ambulatory Visit: Payer: Self-pay

## 2024-01-01 ENCOUNTER — Emergency Department

## 2024-01-01 ENCOUNTER — Observation Stay
Admission: EM | Admit: 2024-01-01 | Discharge: 2024-01-03 | Disposition: A | Discharge status: Home / Self Care | Attending: Internal Medicine | Admitting: Internal Medicine

## 2024-01-01 DIAGNOSIS — R29818 Other symptoms and signs involving the nervous system: Secondary | ICD-10-CM

## 2024-01-01 DIAGNOSIS — Z79899 Other long term (current) drug therapy: Secondary | ICD-10-CM | POA: Insufficient documentation

## 2024-01-01 DIAGNOSIS — J4540 Moderate persistent asthma, uncomplicated: Secondary | ICD-10-CM | POA: Diagnosis present

## 2024-01-01 DIAGNOSIS — Z8673 Personal history of transient ischemic attack (TIA), and cerebral infarction without residual deficits: Secondary | ICD-10-CM | POA: Insufficient documentation

## 2024-01-01 DIAGNOSIS — R42 Dizziness and giddiness: Secondary | ICD-10-CM

## 2024-01-01 DIAGNOSIS — G43909 Migraine, unspecified, not intractable, without status migrainosus: Secondary | ICD-10-CM | POA: Insufficient documentation

## 2024-01-01 DIAGNOSIS — R29898 Other symptoms and signs involving the musculoskeletal system: Principal | ICD-10-CM | POA: Insufficient documentation

## 2024-01-01 DIAGNOSIS — R9389 Abnormal findings on diagnostic imaging of other specified body structures: Secondary | ICD-10-CM

## 2024-01-01 DIAGNOSIS — I639 Cerebral infarction, unspecified: Secondary | ICD-10-CM | POA: Diagnosis present

## 2024-01-01 DIAGNOSIS — I10 Essential (primary) hypertension: Secondary | ICD-10-CM | POA: Diagnosis present

## 2024-01-01 DIAGNOSIS — Z91040 Latex allergy status: Secondary | ICD-10-CM | POA: Insufficient documentation

## 2024-01-01 DIAGNOSIS — R531 Weakness: Secondary | ICD-10-CM

## 2024-01-01 DIAGNOSIS — R55 Syncope and collapse: Principal | ICD-10-CM | POA: Insufficient documentation

## 2024-01-01 DIAGNOSIS — E039 Hypothyroidism, unspecified: Secondary | ICD-10-CM | POA: Diagnosis present

## 2024-01-01 LAB — URINALYSIS, ROUTINE W REFLEX MICROSCOPIC
Bilirubin Urine: NEGATIVE
Glucose, UA: NEGATIVE mg/dL
Ketones, ur: NEGATIVE mg/dL
Nitrite: NEGATIVE
Protein, ur: NEGATIVE mg/dL
Specific Gravity, Urine: 1.011 (ref 1.005–1.030)
pH: 5 (ref 5.0–8.0)

## 2024-01-01 LAB — COMPREHENSIVE METABOLIC PANEL WITH GFR
ALT: 9 U/L (ref 0–44)
AST: 23 U/L (ref 15–41)
Albumin: 3.6 g/dL (ref 3.5–5.0)
Alkaline Phosphatase: 79 U/L (ref 38–126)
Anion gap: 12 (ref 5–15)
BUN: 13 mg/dL (ref 8–23)
CO2: 20 mmol/L — ABNORMAL LOW (ref 22–32)
Calcium: 8.5 mg/dL — ABNORMAL LOW (ref 8.9–10.3)
Chloride: 106 mmol/L (ref 98–111)
Creatinine, Ser: 0.63 mg/dL (ref 0.44–1.00)
GFR, Estimated: >60 mL/min (ref >60)
Glucose, Bld: 71 mg/dL (ref 70–99)
Potassium: 3.9 mmol/L (ref 3.5–5.1)
Sodium: 138 mmol/L (ref 135–145)
Total Bilirubin: 0.4 mg/dL (ref 0.0–1.2)
Total Protein: 6.1 g/dL — ABNORMAL LOW (ref 6.5–8.1)

## 2024-01-01 LAB — CBG MONITORING, ED: Glucose-Capillary: 68 mg/dL — ABNORMAL LOW (ref 70–99)

## 2024-01-01 LAB — APTT: aPTT: 27 s (ref 24–36)

## 2024-01-01 LAB — TSH: TSH: 68.4 u[IU]/mL — ABNORMAL HIGH (ref 0.350–4.500)

## 2024-01-01 LAB — CBC WITH DIFFERENTIAL/PLATELET
Abs Immature Granulocytes: 0.02 K/uL (ref 0.00–0.07)
Basophils Absolute: 0.1 K/uL (ref 0.0–0.1)
Basophils Relative: 1 %
Eosinophils Absolute: 0.1 K/uL (ref 0.0–0.5)
Eosinophils Relative: 1 %
HCT: 38 % (ref 36.0–46.0)
Hemoglobin: 12.8 g/dL (ref 12.0–15.0)
Immature Granulocytes: 0 %
Lymphocytes Relative: 34 %
Lymphs Abs: 2.4 K/uL (ref 0.7–4.0)
MCH: 30.5 pg (ref 26.0–34.0)
MCHC: 33.7 g/dL (ref 30.0–36.0)
MCV: 90.7 fL (ref 80.0–100.0)
Monocytes Absolute: 0.7 K/uL (ref 0.1–1.0)
Monocytes Relative: 10 %
Neutro Abs: 3.8 K/uL (ref 1.7–7.7)
Neutrophils Relative %: 54 %
Platelets: 243 K/uL (ref 150–400)
RBC: 4.19 MIL/uL (ref 3.87–5.11)
RDW: 12.2 % (ref 11.5–15.5)
WBC: 7 K/uL (ref 4.0–10.5)
nRBC: 0 % (ref 0.0–0.2)

## 2024-01-01 LAB — URINE DRUG SCREEN
Amphetamines: NEGATIVE
Barbiturates: NEGATIVE
Benzodiazepines: NEGATIVE
Cocaine: NEGATIVE
Fentanyl: NEGATIVE
Methadone Scn, Ur: NEGATIVE
Opiates: NEGATIVE
Tetrahydrocannabinol: NEGATIVE

## 2024-01-01 LAB — PROTIME-INR
INR: 0.9 (ref 0.8–1.2)
Prothrombin Time: 13.1 s (ref 11.4–15.2)

## 2024-01-01 LAB — T4, FREE: Free T4: 0.5 ng/dL — ABNORMAL LOW (ref 0.61–1.12)

## 2024-01-01 LAB — TROPONIN T, HIGH SENSITIVITY: Troponin T High Sensitivity: <15 ng/L (ref 0–19)

## 2024-01-01 MED ORDER — SODIUM CHLORIDE 0.9% FLUSH
3.0000 mL | Freq: Two times a day (BID) | INTRAVENOUS | Status: DC
  Administered 2024-01-02 – 2024-01-03 (×4): 3 mL via INTRAVENOUS

## 2024-01-01 MED ORDER — ALPRAZOLAM 0.5 MG PO TABS
0.5000 mg | ORAL_TABLET | Freq: Every evening | ORAL | Status: DC | PRN
  Filled 2024-01-01: qty 1

## 2024-01-01 MED ORDER — CLOPIDOGREL BISULFATE 75 MG PO TABS
300.0000 mg | ORAL_TABLET | Freq: Once | ORAL | Status: AC
  Administered 2024-01-02: 300 mg via ORAL
  Filled 2024-01-01: qty 4

## 2024-01-01 MED ORDER — ACETAMINOPHEN 325 MG PO TABS
650.0000 mg | ORAL_TABLET | Freq: Four times a day (QID) | ORAL | Status: DC | PRN
  Administered 2024-01-02: 650 mg via ORAL
  Filled 2024-01-01 (×2): qty 2

## 2024-01-01 MED ORDER — STROKE: EARLY STAGES OF RECOVERY BOOK: Freq: Once | Status: AC

## 2024-01-01 MED ORDER — ATORVASTATIN CALCIUM 20 MG PO TABS
20.0000 mg | ORAL_TABLET | Freq: Every day | ORAL | Status: DC
  Administered 2024-01-02: 20 mg via ORAL
  Filled 2024-01-01: qty 1

## 2024-01-01 MED ORDER — IOHEXOL 350 MG/ML SOLN
75.0000 mL | Freq: Once | INTRAVENOUS | Status: AC | PRN
  Administered 2024-01-01: 75 mL via INTRAVENOUS

## 2024-01-01 MED ORDER — ONDANSETRON HCL 4 MG/2ML IJ SOLN: 4.0000 mg | Freq: Four times a day (QID) | INTRAMUSCULAR | Status: DC | PRN

## 2024-01-01 MED ORDER — TAMOXIFEN CITRATE 20 MG PO TABS
20.0000 mg | ORAL_TABLET | Freq: Every day | ORAL | Status: DC
  Filled 2024-01-01 (×2): qty 2

## 2024-01-01 MED ORDER — PANTOPRAZOLE SODIUM 40 MG PO TBEC
40.0000 mg | DELAYED_RELEASE_TABLET | Freq: Every day | ORAL | Status: DC
  Administered 2024-01-02 – 2024-01-03 (×2): 40 mg via ORAL
  Filled 2024-01-01 (×2): qty 1

## 2024-01-01 MED ORDER — LEVOTHYROXINE SODIUM 50 MCG PO TABS
350.0000 ug | ORAL_TABLET | ORAL | Status: DC
  Administered 2024-01-02: 350 ug via ORAL
  Filled 2024-01-01: qty 7

## 2024-01-01 MED ORDER — ONDANSETRON HCL 4 MG PO TABS: 4.0000 mg | ORAL_TABLET | Freq: Four times a day (QID) | ORAL | Status: DC | PRN

## 2024-01-01 MED ORDER — TRAMADOL HCL 50 MG PO TABS
50.0000 mg | ORAL_TABLET | Freq: Three times a day (TID) | ORAL | Status: DC | PRN
  Administered 2024-01-02 – 2024-01-03 (×3): 50 mg via ORAL
  Filled 2024-01-01 (×3): qty 1

## 2024-01-01 MED ORDER — ACETAMINOPHEN 650 MG RE SUPP: 650.0000 mg | Freq: Four times a day (QID) | RECTAL | Status: DC | PRN

## 2024-01-01 MED ORDER — ASPIRIN 81 MG PO TBEC
81.0000 mg | ORAL_TABLET | Freq: Every day | ORAL | Status: DC
  Administered 2024-01-02 – 2024-01-03 (×2): 81 mg via ORAL
  Filled 2024-01-01 (×2): qty 1

## 2024-01-01 MED ORDER — ENOXAPARIN SODIUM 40 MG/0.4ML IJ SOSY
40.0000 mg | PREFILLED_SYRINGE | INTRAMUSCULAR | Status: DC
  Administered 2024-01-02 – 2024-01-03 (×2): 40 mg via SUBCUTANEOUS
  Filled 2024-01-01 (×2): qty 0.4

## 2024-01-01 MED ORDER — AMITRIPTYLINE HCL 50 MG PO TABS
25.0000 mg | ORAL_TABLET | Freq: Every day | ORAL | Status: DC
  Administered 2024-01-02 (×2): 25 mg via ORAL
  Filled 2024-01-01 (×2): qty 1

## 2024-01-01 NOTE — Consult Note (Addendum)
 [facility] Consult Services   Patient Name:   Delgato-Acosta, Laycee Date of Birth:   1960-10-28 Identification Number:   MRN - [id number] Date of Service:   01/01/2024 21:14:12  Diagnosis:       R55 - Syncope (blackout, fainting, vasovagal attack)       R53.1 - Weakness  Impression:      63F who presents to the ED after a syncope event associated with dizziness, headache, diffuse weakness worse on the right the left with sensory asymmetry on the right face and arm as well as blurry vision. Due to the asymmetry on exam, thrombolytics were discussed with the patient, her husband, and son via telephone. Risks and benefits were discussed and at this time the patient feels the risk outweighs the benefit and she would not like to receive tenecteplase. CTA head and neck has been ordered to rule out LVO.    If there is no LVO recommend Plavix 300 mg now, followed by DAPT starting tomorrow and admission for stroke work up and work up of her syncopal event.    Recommend treating headache symptomatically; can try migraine cocktail.  Our recommendations are outlined below.  Recommendations:        Stroke/Telemetry Floor       Neuro Checks (Q4)       Bedside Swallow Eval       DVT Prophylaxis       IV Fluids, Normal Saline       Head of Bed 30 Degrees       Euglycemia and Avoid Hyperthermia (PRN Acetaminophen )       Bolus with Clopidogrel 300 mg bolus x1 and initiate dual antiplatelet therapy with Aspirin  81 mg daily and Clopidogrel 75 mg daily       Antihypertensives PRN if Blood pressure is greater than 220/120 or there is a concern for End organ damage/contraindications for permissive HTN. If blood pressure is greater than 220/120 give labetalol PO or IV or Vasotec IV with a goal of 15% reduction in BP during the first 24 hours.  Sign Out:       Discussed with Emergency Department  Provider    ------------------------------------------------------------------------------  CTA Head and neck: IMPRESSION: 1. No large vessel occlusion, hemodynamically significant stenosis, or aneurysm in the head or neck. 2. Multifocal right upper lobe opacities suspicious for aspiration or infection.     Metrics: Last Known Well: 01/01/2024 18:00:00 Arrival Time: 01/01/2024 20:53:00 Activation Time: 01/01/2024 21:14:12 Initial Response Time: 01/01/2024 21:20:16 Symptoms: Syncope, Weakness . Initial patient interaction: 01/01/2024 21:24:38 NIHSS Assessment Completed: 01/01/2024 21:29:52 Patient is not a candidate for Thrombolytic. Thrombolytic Medical Decision: 01/01/2024 21:50:15 Patient was not deemed candidate for Thrombolytic because of following reasons: Stroke severity too mild (non-disabling) . Patient/Family declined .  CT Head: I personally reviewed all the CT images that were available to me and it showed: no acute intracranial abnormality.  Primary Provider Notified of Diagnostic Impression and Management Plan on: 01/01/2024 22:02:32    ------------------------------------------------------------------------------  History of Present Illness: Patient is a 63 year old Female.  Patient was brought by EMS for symptoms of Syncope, Weakness . 63F with a history of HTN, HLD, recent unexplained weight loss undergoing work up who presented after a syncopal event followed by generalized weakness, worse on the right than left, blurry vision, and headache. The patient was last normal at 1800. She was sitting down talking on the phone with her daughter and afterwards around 1915 she stood up, became dizzy, briefly  lost consciousness, and was lowered to the ground. She did not hit her head. She is having a headache currently.  She endorses weakness and sensory deficit on the right, bilateral blurred vision in the setting of diffuse weakness.    Past Medical History:       Hypertension      Hyperlipidemia      Coronary Artery Disease      Migraine Headaches      There is no history of Atrial Fibrillation      There is no history of Stroke  Medications:  No Anticoagulant use  Antiplatelet use: Yes aspirin  81 mg daily Reviewed EMR for current medications  Allergies:  Reviewed Description: Reviewed medications in EMR  Social History: Smoking: Former Alcohol Use: No Drug Use: No  Family History:  There Is Family History Of:CAD, DM, HTN, breast cancer, stomach cancer, esophageal cancer There is no family history of premature cerebrovascular disease pertinent to this consultation  ROS : 14 Points Review of Systems was performed and was negative except mentioned in HPI.  Past Surgical History: There Is No Surgical History Contributory To Today's Visit There Is Surgical History of:  Hysterectomy, Rectocele Repair, Endometriosis Fulguration, Knee Surgery, Esophageal dilation, Cystocele repair, cholecystectomy, breast biopsy, bladder surgery    Examination: BP(133/76), Pulse(76), Blood Glucose(68) 1A: Level of Consciousness - Alert; keenly responsive + 0 1B: Ask Month and Age - Both Questions Right + 0 1C: Blink Eyes & Squeeze Hands - Performs Both Tasks + 0 2: Test Horizontal Extraocular Movements - Normal + 0 3: Test Visual Fields - No Visual Loss + 0 4: Test Facial Palsy (Use Grimace if Obtunded) - Normal symmetry + 0 5A: Test Left Arm Motor Drift - Drift, but doesn't hit bed + 1 5B: Test Right Arm Motor Drift - Drift, but doesn't hit bed + 1 6A: Test Left Leg Motor Drift - No Drift for 5 Seconds + 0 6B: Test Right Leg Motor Drift - No Drift for 5 Seconds + 0 7: Test Limb Ataxia (FNF/Heel-Shin) - No Ataxia + 0 8: Test Sensation - Mild-Moderate Loss: Less Sharp/More Dull + 1 9: Test Language/Aphasia - Normal; No aphasia + 0 10: Test Dysarthria - Normal + 0 11: Test Extinction/Inattention - No abnormality + 0  NIHSS Score:  3   Pre-Morbid Modified Rankin Scale: 0 Points = No symptoms at all  Spoke with : Dr. Claudene I reviewed the available imaging via [facility] and initiated discussion with the primary provider  This consult was conducted in real time using interactive audio and video technology. Patient was informed of the technology being used for this visit and agreed to proceed. Patient located in hospital and provider located at home/office setting.   Patient is being evaluated for possible acute neurologic impairment and high probability of imminent or life-threatening deterioration. I spent total of 51 minutes providing care to this patient, including time for face to face visit via telemedicine, review of medical records, imaging studies and discussion of findings with providers, the patient and/or family.    Dr Geni Maroon   [facility] For Inpatient follow-up with [facility] physician please call [facility] at [phone]. As we are not an outpatient service for any post hospital discharge needs please contact the hospital for assistance. If you have any questions for the [facility] physicians or need to reconsult for clinical or diagnostic changes please contact us  via [facility] at [phone].  Non-radiologist review of imaging performed to assist with emergent clinical decision-making. Remote  physician workstations do not possess the same resolution, calibration, or diagnostic capabilities as hospital-based radiology reading stations, and formal radiologist read is necessary.   Signature : Geni Maroon
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
## 2024-01-01 NOTE — ED Notes (Signed)
 Called [facility] to initiate Code Stroke @2111  Spoke to Rep: Ruby who gathered demographics and symptoms to activate page

## 2024-01-01 NOTE — ED Notes (Signed)
 Pt declines TNK after discussing risks and benefits with tele-neurology and family

## 2024-01-01 NOTE — ED Notes (Signed)
 Patient transported to CT
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
## 2024-01-02 ENCOUNTER — Observation Stay: Admit: 2024-01-02 | Discharge: 2024-01-02 | Disposition: A | Attending: Internal Medicine

## 2024-01-02 ENCOUNTER — Observation Stay

## 2024-01-02 DIAGNOSIS — R569 Unspecified convulsions: Secondary | ICD-10-CM

## 2024-01-02 DIAGNOSIS — R55 Syncope and collapse: Secondary | ICD-10-CM

## 2024-01-02 DIAGNOSIS — R9389 Abnormal findings on diagnostic imaging of other specified body structures: Secondary | ICD-10-CM

## 2024-01-02 DIAGNOSIS — R29818 Other symptoms and signs involving the nervous system: Secondary | ICD-10-CM | POA: Diagnosis not present

## 2024-01-02 LAB — GLUCOSE, CAPILLARY
Glucose-Capillary: 62 mg/dL — ABNORMAL LOW (ref 70–99)
Glucose-Capillary: 70 mg/dL (ref 70–99)

## 2024-01-02 LAB — LIPID PANEL
Cholesterol: 183 mg/dL (ref 0–200)
HDL: 55 mg/dL (ref >40)
LDL Cholesterol: 114 mg/dL — ABNORMAL HIGH (ref 0–99)
Total CHOL/HDL Ratio: 3.3 ratio
Triglycerides: 67 mg/dL (ref <150)
VLDL: 13 mg/dL (ref 0–40)

## 2024-01-02 LAB — HEMOGLOBIN A1C
Hgb A1c MFr Bld: 4.7 % — ABNORMAL LOW (ref 4.8–5.6)
Mean Plasma Glucose: 88 mg/dL

## 2024-01-02 LAB — ECHOCARDIOGRAM COMPLETE
AR max vel: 3.03 cm2
AV Area VTI: 3.38 cm2
AV Area mean vel: 2.61 cm2
AV Mean grad: 2 mmHg
AV Peak grad: 4.7 mmHg
Ao pk vel: 1.08 m/s
Area-P 1/2: 2.93 cm2
Height: 60 in
MV VTI: 2.29 cm2
S' Lateral: 2.9 cm
Weight: 2225.76 [oz_av]

## 2024-01-02 LAB — RESP PANEL BY RT-PCR (RSV, FLU A&B, COVID)  RVPGX2
Influenza A by PCR: NEGATIVE
Influenza B by PCR: NEGATIVE
Resp Syncytial Virus by PCR: NEGATIVE
SARS Coronavirus 2 by RT PCR: NEGATIVE

## 2024-01-02 LAB — D-DIMER, QUANTITATIVE: D-Dimer, Quant: 0.4 ug{FEU}/mL (ref 0.00–0.50)

## 2024-01-02 LAB — TROPONIN T, HIGH SENSITIVITY: Troponin T High Sensitivity: <15 ng/L (ref 0–19)

## 2024-01-02 LAB — PROCALCITONIN: Procalcitonin: <0.1 ng/mL

## 2024-01-02 LAB — HIV ANTIBODY (ROUTINE TESTING W REFLEX): HIV Screen 4th Generation wRfx: NONREACTIVE

## 2024-01-02 MED ORDER — LORAZEPAM 2 MG/ML IJ SOLN
0.5000 mg | Freq: Once | INTRAMUSCULAR | Status: AC | PRN
  Administered 2024-01-02: 0.5 mg via INTRAVENOUS
  Filled 2024-01-02: qty 1

## 2024-01-02 MED ORDER — CLOPIDOGREL BISULFATE 75 MG PO TABS
75.0000 mg | ORAL_TABLET | Freq: Every day | ORAL | Status: DC
  Administered 2024-01-02 – 2024-01-03 (×2): 75 mg via ORAL
  Filled 2024-01-02 (×2): qty 1

## 2024-01-02 MED ORDER — ATORVASTATIN CALCIUM 20 MG PO TABS
80.0000 mg | ORAL_TABLET | Freq: Every day | ORAL | Status: DC
  Administered 2024-01-03: 80 mg via ORAL
  Filled 2024-01-02: qty 4

## 2024-01-02 NOTE — Progress Notes (Signed)
*  PRELIMINARY RESULTS* Echocardiogram 2D Echocardiogram has been performed.  Floydene Harder 01/02/2024, 8:43 AM

## 2024-01-02 NOTE — Evaluation (Signed)
 Occupational Therapy Evaluation Patient Details Name: Vanessa Berg MRN: [id number] DOB: 05-01-1960 Today's Date: 01/02/2024   History of Present Illness   Pt is a 63 y.o. female  currently undergoing workup for unexplained weight loss, being admitted for stroke/syncope workup. CT head was negative,CTA head and neck that showed no LVO. Current MD assessment: Acute focal neurological deficit and possible TIA/CVA. PMH of HTN, HLD, CAD, hypothyroidism, migraines.     Clinical Impressions Pt was seen for OT evaluation this date. PTA, pt lives in a one level home with 6 STE with her spouse. She is fully independent, working and driving without AD use at baseline. Pt presents with deficits in strength, balance, low activity tolerance and dizziness, affecting safe and optimal ADL completion. Reports she may still be feeling effects of ativan  leading to grogginess/dizziness during session. Pt currently requires supervision with use of bed features for bed mobility. She required CGA for STS with cues for hand placement and for ADL transfer ~20 ft to bathroom using RW. Very slow gait with occasional narrow BOS noted. CGA required for all toileting tasks and standing hand hygiene. Pt reports up to 7/10 dizziness with activity during session with BP stable at 119/76. She is far from her baseline and would recommend a RW and caregiver support to return home safely. Will follow acutely and recommend follow up OT services on DC.      If plan is discharge home, recommend the following:   A little help with walking and/or transfers;A little help with bathing/dressing/bathroom;Help with stairs or ramp for entrance;Assist for transportation;Assistance with cooking/housework     Functional Status Assessment   Patient has had a recent decline in their functional status and demonstrates the ability to make significant improvements in function in a reasonable and predictable amount of time.      Equipment Recommendations   Other (comment) (RW)     Recommendations for Other Services         Precautions/Restrictions   Precautions Precautions: Fall Recall of Precautions/Restrictions: Intact Restrictions Weight Bearing Restrictions Per Provider Order: No     Mobility Bed Mobility Overal bed mobility: Needs Assistance Bed Mobility: Supine to Sit, Sit to Supine     Supine to sit: Supervision, HOB elevated, Used rails Sit to supine: Supervision   General bed mobility comments: increased time/effort and use of bed features, no physical assist    Transfers Overall transfer level: Needs assistance Equipment used: Rolling walker (2 wheels) Transfers: Sit to/from Stand Sit to Stand: Contact guard assist           General transfer comment: cues for hand placement, very slow gait, mild unsteadiness requiring RW for stability      Balance Overall balance assessment: Needs assistance Sitting-balance support: Feet supported Sitting balance-Leahy Scale: Good     Standing balance support: Bilateral upper extremity supported, During functional activity Standing balance-Leahy Scale: Fair Standing balance comment: BUE support on RW                           ADL either performed or assessed with clinical judgement   ADL Overall ADL's : Needs assistance/impaired     Grooming: Wash/dry hands;Standing;Contact guard assist                   Toilet Transfer: Contact guard assist;Rolling walker (2 wheels);Regular Toilet   Toileting- Clothing Manipulation and Hygiene: Supervision/safety;Sitting/lateral lean;Sit to/from stand       Functional mobility  during ADLs: Contact guard assist;Rolling walker (2 wheels)       Vision         Perception         Praxis         Pertinent Vitals/Pain Pain Assessment Pain Assessment: No/denies pain     Extremity/Trunk Assessment Upper Extremity Assessment Upper Extremity Assessment:  Generalized weakness   Lower Extremity Assessment Lower Extremity Assessment: Generalized weakness       Communication Communication Communication: No apparent difficulties   Cognition Arousal: Alert Behavior During Therapy: WFL for tasks assessed/performed                                 Following commands: Intact       Cueing  General Comments   Cueing Techniques: Verbal cues  pt reports dizziness up to 7/10 with activity, subsides with return to bed   Exercises Other Exercises Other Exercises: Edu on role of OT in acute setting and DC recommedations for [facility] with intermittent supervision/support vs CIR.   Shoulder Instructions      Home Living Family/patient expects to be discharged to:: Private residence Living Arrangements: Spouse/significant other Available Help at Discharge: Family;Available PRN/intermittently Type of Home: Mobile home Home Access: Stairs to enter Entrance Stairs-Number of Steps: 6 Entrance Stairs-Rails: Right;Left;Can reach both Home Layout: One level     Bathroom Shower/Tub: Producer, Television/film/video: Standard     Home Equipment: None          Prior Functioning/Environment Prior Level of Function : Independent/Modified Independent;Working/employed;Driving             Mobility Comments: IND ADLs Comments: 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 [place]    OT Problem List: Decreased strength;Decreased activity tolerance;Impaired balance (sitting and/or standing);Decreased safety awareness   OT Treatment/Interventions: Self-care/ADL training;Therapeutic exercise;Therapeutic activities;Patient/family education;Balance training;DME and/or AE instruction      OT Goals(Current goals can be found in the care plan section)   Acute Rehab OT Goals Patient Stated Goal: go home OT Goal Formulation: With patient Time For Goal Achievement: 01/16/24 Potential to Achieve Goals: Good ADL Goals Pt Will Perform  Grooming: with modified independence;standing Pt Will Perform Lower Body Dressing: with modified independence;sitting/lateral leans;sit to/from stand Pt Will Transfer to Toilet: with modified independence;ambulating   OT Frequency:  Min 2X/week    Co-evaluation              AM-PAC OT 6 Clicks Daily Activity     Outcome Measure Help from another person eating meals?: A Little Help from another person taking care of personal grooming?: A Little Help from another person toileting, which includes using toliet, bedpan, or urinal?: A Little Help from another person bathing (including washing, rinsing, drying)?: A Little Help from another person to put on and taking off regular upper body clothing?: None Help from another person to put on and taking off regular lower body clothing?: A Little 6 Click Score: 19   End of Session Equipment Utilized During Treatment: Gait belt;Rolling walker (2 wheels) Nurse Communication: Mobility status  Activity Tolerance: Patient tolerated treatment well Patient left: in bed;with call bell/phone within reach;with bed alarm set;with family/visitor present  OT Visit Diagnosis: Other abnormalities of gait and mobility (R26.89);Muscle weakness (generalized) (M62.81)                Time: 9052-8988 OT Time Calculation (min): 24 min Charges:  OT General Charges $  OT Visit: 1 Visit OT Evaluation $OT Eval Moderate Complexity: 1 Mod OT Treatments $Self Care/Home Management : 8-22 mins  Irvan Tiedt Chrismon, OTR/L  01/02/24, 1:41 PM   Eleen Litz E Chrismon 01/02/2024, 1:38 PM

## 2024-01-02 NOTE — ED Notes (Signed)
 MRI tech came to pick up patient for MRI. Patient states she needs to be premedicated for MRI d/t claustrophobia. ER MD made aware. Patient currently has ready bed upstairs. Patient updated on plan of care to be transported to room upstairs while waiting for medications to be added to chart for MRI.

## 2024-01-02 NOTE — Progress Notes (Signed)
*  PRELIMINARY RESULTS* Echocardiogram 2D Echocardiogram has been performed.  Floydene Harder 01/02/2024, 8:42 AM

## 2024-01-02 NOTE — Progress Notes (Signed)
 PT Cancellation Note  Patient Details Name: Vanessa Berg MRN: [id number] DOB: 1960-07-30   Cancelled Treatment:    Reason Eval/Treat Not Completed: Patient at procedure or test/unavailable Will re-attempt at later time/date.   Geneen Dieter 01/02/2024, 1:56 PM

## 2024-01-02 NOTE — Progress Notes (Signed)
 Pt off the floor for MRI

## 2024-01-02 NOTE — H&P (Addendum)
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 HISTORY: Neuro deficit, acute, stroke suspected. FINDINGS: CTA NECK: AORTIC ARCH AND ARCH VESSELS: No dissection or arterial injury. No significant stenosis of the brachiocephalic or subclavian arteries. CERVICAL CAROTID ARTERIES: No dissection, arterial injury, or hemodynamically significant stenosis by NASCET criteria. CERVICAL VERTEBRAL ARTERIES: No dissection, arterial injury, or significant stenosis. LUNGS AND MEDIASTINUM: Multifocal opacities in the right upper lobe likely aspiration/infection. SOFT TISSUES: No acute abnormality. BONES: No acute abnormality. CTA HEAD: ANTERIOR CIRCULATION: No significant stenosis of the internal carotid arteries. No significant stenosis of the anterior cerebral arteries. No significant stenosis of the middle cerebral arteries. No aneurysm. POSTERIOR CIRCULATION: No significant stenosis of the posterior cerebral arteries. No significant stenosis of the basilar artery. No significant stenosis of the vertebral arteries. No aneurysm. OTHER: No dural venous sinus thrombosis on this non-dedicated study. IMPRESSION: 1. No large vessel  occlusion, hemodynamically significant stenosis, or aneurysm in the head or neck. 2. Multifocal right upper lobe opacities suspicious for aspiration or infection. Electronically signed by: Franky Stanford MD 01/01/2024 10:47 PM EST RP Workstation: [id number]   DG Chest Portable 1 View Result Date: 01/01/2024 CLINICAL DATA:  altered, seizure? EXAM: PORTABLE CHEST - 1  VIEW COMPARISON:  03/20/2020 FINDINGS: No focal airspace consolidation, pleural effusion, or pneumothorax. No cardiomegaly. No acute fracture or destructive lesions. Multilevel thoracic osteophytosis. Cholecystectomy clips. IMPRESSION: No acute cardiopulmonary abnormality. Electronically Signed   By: Rogelia Myers M.D.   On: 01/01/2024 22:03   CT HEAD CODE STROKE WO CONTRAST (LKW 0-4.5h, LVO 0-24h) Result Date: 01/01/2024 EXAM: CT HEAD WITHOUT CONTRAST 01/01/2024 09:22:13 PM TECHNIQUE: CT of the head was performed without the administration of intravenous contrast. Automated exposure control, iterative reconstruction, and/or weight based adjustment of the mA/kV was utilized to reduce the radiation dose to as low as reasonably achievable. COMPARISON: None available. CLINICAL HISTORY: Neuro deficit, acute, stroke suspected; right sided weakness, syncope. FINDINGS: BRAIN AND VENTRICLES: No acute hemorrhage. No evidence of acute infarct. No hydrocephalus. No extra-axial collection. No mass effect or midline shift. ORBITS: No acute abnormality. SINUSES: No acute abnormality. SOFT TISSUES AND SKULL: No acute soft tissue abnormality. No skull fracture. [place] Stroke Program Early CT Score (ASPECTS) ----- Ganglionic (caudate, IC, lentiform nucleus, insula, M1-M3): 7 Supraganglionic (M4-M6): 3 Total: 10 IMPRESSION: 1. No acute intracranial abnormality 2. ASPECTS: 10. 3. Findings communicated to Dr Ester Sharps at 9:30 pm on 01/01/2024 Electronically signed by: Franky Stanford MD 01/01/2024 09:30 PM EST RP Workstation: [id number]   Data Reviewed for  HPI: Relevant notes from primary care and specialist visits, past discharge summaries as available in EHR, including Care Everywhere. Prior diagnostic testing as pertinent to current admission diagnoses Updated medications and problem lists for reconciliation ED course, including vitals, labs, imaging, treatment and response to treatment Triage notes, nursing and pharmacy notes and ED provider's notes Notable results as noted above in HPI      Assessment and Plan: * Acute focal neurological deficit Possible TIA/CVA Patient with right-sided weakness preceded by presyncope, s/p stroke alert, seen by teleneuro Permissive hypertension for first 24-48 hrs post stroke onset: Prn Labetalol IV or Vasotec IV If BP greater than 220/120  Statins for LDL goal less than 70 ASA 81mg  daily, Plavix  75mg  daily x 3 weeks then monotherapy thereafter(received Plavix  load in the ED) Telemetry, echo, MRI--> negative for stroke Avoid dextrose containing fluids, Maintain euglycemia, euthermia Neuro checks q4 hrs x 24 hrs and then per shift Head of bed 30 degrees Physical therapy/Occupational therapy/Speech therapy if failed dysphagia screen   Postural dizziness with presyncope Unclear etiology, stroke versus symptomatic hypothyroidism versus pneumonia versus DVT Will get an EEG as patient had some shaking Will get a D-dimer with further workup if strongly positive-given abnormality on CT chest Treat hypothyroidism CT showing right upper lobe opacity but otherwise workup not consistent with infection We will get a respiratory viral panel and procalcitonin as well Continuous cardiac monitoring, echocardiogram Neurologic checks with fall and aspiration precautions  Hypothyroidism Possibly symptomatic TSH 68 with T4 of 0.5 Received levothyroxine  350 mcg in the ED Will continue with daily levothyroxine  175 mcg Continuous cardiac monitoring  Abnormal CT of the chest--RUL opacity CT head showed  multifocal right upper lobe opacity suspicious for aspiration or infection No respiratory symptoms and CXR waas non acute Can consider further workup up/ monitoring for respiratory symptoms Follow up procalcitonin and respiratory viral panel-->both negative  Migraines Patient has associated headache Pain control. Will avoid triptans in view of workup for stroke  Essential hypertension Holding home meds to allow for permissive hypertension  Moderate persistent asthma Not acutely exacerbated Albuterol  as needed    DVT prophylaxis: Lovenox   Consults: none  Advance Care Planning:   Code  Status: Full Code   Family Communication: Husband at bedside  Disposition Plan: Back to previous home environment  Severity of Illness: The appropriate patient status for this patient is OBSERVATION. Observation status is judged to be reasonable and necessary in order to provide the required intensity of service to ensure the patient's safety. The patient's presenting symptoms, physical exam findings, and initial radiographic and laboratory data in the context of their medical condition is felt to place them at decreased risk for further clinical deterioration. Furthermore, it is anticipated that the patient will be medically stable for discharge from the hospital within 2 midnights of admission.   Author: Delayne LULLA Solian, MD 01/02/2024 12:05 AM  For on call review [email or web address].

## 2024-01-02 NOTE — Assessment & Plan Note (Signed)
 CT head showed multifocal right upper lobe opacity suspicious for aspiration or infection No respiratory symptoms and CXR waas non acute Can consider further workup up/ monitoring for respiratory symptoms Follow up procalcitonin and respiratory viral panel-->both negative

## 2024-01-02 NOTE — Assessment & Plan Note (Signed)
 Possibly symptomatic TSH 68 with T4 of 0.5 Received levothyroxine  350 mcg in the ED Will continue with daily levothyroxine  175 mcg Continuous cardiac monitoring

## 2024-01-02 NOTE — Procedures (Signed)
 Routine EEG Report  Vanessa Berg is a 63 y.o. female with a history of syncope who is undergoing an EEG to evaluate for seizures.  Report: This EEG was acquired with electrodes placed according to the International 10-20 electrode system (including Fp1, Fp2, F3, F4, C3, C4, P3, P4, O1, O2, T3, T4, T5, T6, A1, A2, Fz, Cz, Pz). The following electrodes were missing or displaced: none.  The occipital dominant rhythm was 8.5 Hz. This activity is reactive to stimulation. Drowsiness was manifested by background fragmentation; deeper stages of sleep were identified by K complexes and sleep spindles. There was no focal slowing. There were no interictal epileptiform discharges. There were no electrographic seizures identified. There was no abnormal response to photic stimulation or hyperventilation.   Impression: This EEG was obtained while awake and asleep and is normal.    Clinical Correlation: Normal EEGs, however, do not rule out epilepsy.  Elida Ross, MD [facility] [phone]  If 7pm- 7am, please page neurology on call as listed in AMION.

## 2024-01-02 NOTE — Progress Notes (Addendum)
 TOC Brief Assessment Note Patient is from home with her spouse. She became dizzy and her husband assisted her down. She was admitted to r/o TIA. On q 4 h neurochecks. PT recs are pending

## 2024-01-02 NOTE — Progress Notes (Signed)
 Progress Note   Patient: Vanessa Berg FMW:[id number] DOB: 04-10-1960 DOA: 01/01/2024     0 DOS: the patient was seen and examined on 01/02/2024   Brief hospital course: From HPI Vanessa Berg is a 63 y.o. female with medical history significant for HTN, HLD, CAD, hypothyroidism, migraines, currently undergoing workup for unexplained weight loss, being admitted for stroke/syncope workup.  Patient was in her usual state of health then started feeling dizzy.  Her husband who witnessed the episode caught her and eased her down.  She then had generalized shaking and felt weak all over, more on the right side and appeared confused and disoriented.  She also has a headache and impaired vision out of her right eye.  On arrival to the ED vitals were within normal limits and she had an NIH of 2 whereupon stroke alert was activated.  Stat CT head was negative,CTA head and neck that showed no LVO (did show multifocal right upper lobe opacity suspicious for aspiration or infection) Had a teleneurology consult with recommendation for tPA but patient declined.  Recommendation for Plavix  load and admit for stroke workup. Other workup in the ED: Blood work notable for elevated TSH of 68 with depressed T4 of 0.5.  UA showed large leukocytes otherwise unremarkable.  UDS, CBC and CMP unremarkable. EKG showed sinus at 75 with nonspecific T wave abnormalities Chest x-ray nonacute Patient treated with Plavix  load 300 mg.  Also got a loading dose of levothyroxine  350 mcg Admission requested   Assessment and Plan:  Acute focal neurological deficit Possible TIA/CVA Patient with right-sided weakness preceded by presyncope, s/p stroke alert, seen by teleneuro Permissive hypertension for first 24-48 hrs post stroke onset: Prn Labetalol IV or Vasotec IV If BP greater than 220/120  Statins for LDL goal less than 70 ASA 81mg  daily, Plavix  75mg  daily x 3 weeks then monotherapy  thereafter(received Plavix  load in the ED) Telemetry, echo, MRI--> negative for stroke I have discussed with neurologist Dr. Elida Ross Avoid dextrose containing fluids, Maintain euglycemia, euthermia Continue PT OT Patient will need home health for discharge     Postural dizziness with presyncope Unclear etiology, stroke versus symptomatic hypothyroidism versus pneumonia versus DVT CTA head and neck did not show large vessel occlusion EEG reviewed-did not show seizures Continue treatment for hypothyroidism CT showing right upper lobe opacity but otherwise workup not consistent with infection Echocardiogram showed grade 2 diastolic dysfunction with normal EF Neurologic checks with fall and aspiration precautions   Hypothyroidism Possibly symptomatic TSH 68 with T4 of 0.5 Received levothyroxine  350 mcg in the ED Will continue with daily levothyroxine  175 mcg Continuous cardiac monitoring   Migraines Patient has associated headache Pain control. Will avoid triptans in view of workup for stroke   Essential hypertension Holding home meds to allow for permissive hypertension   Moderate persistent asthma Not acutely exacerbated Albuterol  as needed       DVT prophylaxis: Lovenox    Consults: none   Advance Care Planning:   Code Status: Full Code    Family Communication: Husband at bedside   Subjective:   Patient seen and examined at bedside this morning Tells me her weakness is improved Later in the day she began experiencing some dizziness Yet to work with PT   Physical Exam:  Vitals and nursing note reviewed.  Constitutional:      General: She is not in acute distress. HENT:     Head: Normocephalic and atraumatic.  Cardiovascular:     Rate and  Rhythm: Normal rate and regular rhythm.     Heart sounds: Normal heart sounds.  Pulmonary:     Effort: Pulmonary effort is normal.     Breath sounds: Normal breath sounds.  Abdominal:     Palpations: Abdomen is  soft.     Tenderness: There is no abdominal tenderness.  Vitals:   01/02/24 0603 01/02/24 0806 01/02/24 1118 01/02/24 1600  BP: 91/77 106/62 117/75 116/71  Pulse: 85 68 (!) 58 (!) 58  Resp:  17 17   Temp:  97.7 F (36.5 C) (!) 97.5 F (36.4 C) 97.8 F (36.6 C)  TempSrc:    Oral  SpO2: 100% 98% 100% 100%  Weight:      Height:        Data reviewed: MRI of the brain reviewed that did not show any acute stroke    Latest Ref Rng & Units 01/01/2024    9:08 PM 10/10/2017    5:26 AM 10/09/2017    9:29 AM  BMP  Glucose 70 - 99 mg/dL 71  85  98   BUN 8 - 23 mg/dL 13  8  10    Creatinine 0.44 - 1.00 mg/dL 9.36  9.31  9.39   Sodium 135 - 145 mmol/L 138  143  140   Potassium 3.5 - 5.1 mmol/L 3.9  3.8  3.8   Chloride 98 - 111 mmol/L 106  107  107   CO2 22 - 32 mmol/L 20  26  25    Calcium  8.9 - 10.3 mg/dL 8.5  9.0  9.4        Latest Ref Rng & Units 01/01/2024    9:08 PM 10/09/2017    9:29 AM 11/22/2016    5:42 AM  CBC  WBC 4.0 - 10.5 K/uL 7.0  6.7  8.4   Hemoglobin 12.0 - 15.0 g/dL 87.1  87.6  87.3   Hematocrit 36.0 - 46.0 % 38.0  37.9  38.7   Platelets 150 - 400 K/uL 243  278  256      Author: Drue ONEIDA Potter, MD 01/02/2024 5:25 PM  For on call review [email or web address].

## 2024-01-02 NOTE — Progress Notes (Signed)
 Chaplain responded to code stroke. Met wit patients  husband who was very nervous and concerned about wife. Offered care and calming tools to get through this difficult health challenge.  Lazar Tierce Chaplain Intern-[facility]

## 2024-01-02 NOTE — Progress Notes (Signed)
 SLP Cancellation Note  Patient Details Name: Vanessa Berg MRN: [id number] DOB: 06-05-1960   Cancelled treatment:       Reason Eval/Treat Not Completed: SLP screened, no needs identified, will sign off (chart reviewed; consulted NSG then met w/ pt/Husband in room.)  Pt is a 63 y.o. female with medical history significant for HTN, HLD, CAD, hypothyroidism, migraines, currently undergoing workup for unexplained weight loss, being admitted for stroke/syncope workup.  Patient was in her usual state of health then started feeling dizzy.  Pt denied any difficulty swallowing and is currently on a regular diet; tolerates swallowing pills w/ water per NSG. Pt conversed in conversation w/out overt expressive/receptive deficits noted; pt denied any speech-language deficits. Speech clear. No further skilled ST services indicated as pt appears at her baseline. Pt agreed. NSG to reconsult if any change in status while admitted.     Comer Portugal, MS, CCC-SLP Speech Language Pathologist Rehab Services; [facility] [phone] (ascom) Alixander Rallis 01/02/2024, 1:05 PM

## 2024-01-02 NOTE — Progress Notes (Signed)
 Eeg done

## 2024-01-02 NOTE — Assessment & Plan Note (Addendum)
 Unclear etiology, stroke versus symptomatic hypothyroidism versus pneumonia versus DVT Will get an EEG as patient had some shaking Will get a D-dimer with further workup if strongly positive-given abnormality on CT chest Treat hypothyroidism CT showing right upper lobe opacity but otherwise workup not consistent with infection We will get a respiratory viral panel and procalcitonin as well Continuous cardiac monitoring, echocardiogram Neurologic checks with fall and aspiration precautions

## 2024-01-02 NOTE — Progress Notes (Signed)
   01/02/24 0945  Spiritual Encounters  Type of Visit Initial  Care provided to: Pt not available;Family (Patient sleeping)  Reason for visit Routine spiritual support  OnCall Visit No   Overnight Chaplain suggested the Chaplain visit patient and just offer a follow-up after a code stroke. Patient was sleeping and spouse at bedside declined spiritual care.    Rev. Rana M. Nicholaus, [place]. Chaplain Resident [facility]

## 2024-01-02 NOTE — Assessment & Plan Note (Addendum)
 Possible TIA/CVA Patient with right-sided weakness preceded by presyncope, s/p stroke alert, seen by teleneuro Permissive hypertension for first 24-48 hrs post stroke onset: Prn Labetalol IV or Vasotec IV If BP greater than 220/120  Statins for LDL goal less than 70 ASA 81mg  daily, Plavix  75mg  daily x 3 weeks then monotherapy thereafter(received Plavix  load in the ED) Telemetry, echo, MRI--> negative for stroke Avoid dextrose containing fluids, Maintain euglycemia, euthermia Neuro checks q4 hrs x 24 hrs and then per shift Head of bed 30 degrees Physical therapy/Occupational therapy/Speech therapy if failed dysphagia screen

## 2024-01-02 NOTE — Assessment & Plan Note (Addendum)
 Patient has associated headache Pain control. Will avoid triptans in view of workup for stroke

## 2024-01-02 NOTE — Plan of Care (Signed)
  Problem: Cardiac: Goal: Will achieve and/or maintain adequate cardiac output Outcome: Progressing   Problem: Coping: Goal: Will verbalize positive feelings about self Outcome: Progressing   Problem: Ischemic Stroke/TIA Tissue Perfusion: Goal: Complications of ischemic stroke/TIA will be minimized Outcome: Progressing   Problem: Clinical Measurements: Goal: Ability to maintain clinical measurements within normal limits will improve Outcome: Progressing   Problem: Coping: Goal: Level of anxiety will decrease Outcome: Progressing

## 2024-01-02 NOTE — Assessment & Plan Note (Signed)
 Not acutely exacerbated Albuterol  as needed

## 2024-01-02 NOTE — Assessment & Plan Note (Signed)
 Holding home meds to allow for permissive hypertension

## 2024-01-03 DIAGNOSIS — R29818 Other symptoms and signs involving the nervous system: Secondary | ICD-10-CM | POA: Diagnosis not present

## 2024-01-03 LAB — BASIC METABOLIC PANEL WITH GFR
Anion gap: 6 (ref 5–15)
BUN: 9 mg/dL (ref 8–23)
CO2: 30 mmol/L (ref 22–32)
Calcium: 9 mg/dL (ref 8.9–10.3)
Chloride: 103 mmol/L (ref 98–111)
Creatinine, Ser: 0.84 mg/dL (ref 0.44–1.00)
GFR, Estimated: >60 mL/min (ref >60)
Glucose, Bld: 70 mg/dL (ref 70–99)
Potassium: 4.6 mmol/L (ref 3.5–5.1)
Sodium: 139 mmol/L (ref 135–145)

## 2024-01-03 LAB — CBC WITH DIFFERENTIAL/PLATELET
Abs Immature Granulocytes: 0.01 K/uL (ref 0.00–0.07)
Basophils Absolute: 0.1 K/uL (ref 0.0–0.1)
Basophils Relative: 1 %
Eosinophils Absolute: 0.1 K/uL (ref 0.0–0.5)
Eosinophils Relative: 2 %
HCT: 36.9 % (ref 36.0–46.0)
Hemoglobin: 12.5 g/dL (ref 12.0–15.0)
Immature Granulocytes: 0 %
Lymphocytes Relative: 46 %
Lymphs Abs: 2.2 K/uL (ref 0.7–4.0)
MCH: 30.9 pg (ref 26.0–34.0)
MCHC: 33.9 g/dL (ref 30.0–36.0)
MCV: 91.3 fL (ref 80.0–100.0)
Monocytes Absolute: 0.5 K/uL (ref 0.1–1.0)
Monocytes Relative: 10 %
Neutro Abs: 2 K/uL (ref 1.7–7.7)
Neutrophils Relative %: 41 %
Platelets: 247 K/uL (ref 150–400)
RBC: 4.04 MIL/uL (ref 3.87–5.11)
RDW: 12.3 % (ref 11.5–15.5)
WBC: 4.8 K/uL (ref 4.0–10.5)
nRBC: 0 % (ref 0.0–0.2)

## 2024-01-03 LAB — GLUCOSE, CAPILLARY: Glucose-Capillary: 78 mg/dL (ref 70–99)

## 2024-01-03 MED ORDER — ATORVASTATIN CALCIUM 80 MG PO TABS
80.0000 mg | ORAL_TABLET | Freq: Every day | ORAL | 3 refills | Status: DC
  Filled 2024-01-03: qty 30, 30d supply, fill #0

## 2024-01-03 MED ORDER — CLOPIDOGREL BISULFATE 75 MG PO TABS: 75.0000 mg | ORAL_TABLET | Freq: Every day | ORAL | 0 refills | Status: AC

## 2024-01-03 MED ORDER — LEVOTHYROXINE SODIUM 175 MCG PO TABS: 175.0000 ug | ORAL_TABLET | Freq: Every day | ORAL | 3 refills | Status: AC

## 2024-01-03 MED ORDER — ATORVASTATIN CALCIUM 80 MG PO TABS: 80.0000 mg | ORAL_TABLET | Freq: Every day | ORAL | 3 refills | Status: AC

## 2024-01-03 MED ORDER — CLOPIDOGREL BISULFATE 75 MG PO TABS
75.0000 mg | ORAL_TABLET | Freq: Every day | ORAL | 0 refills | Status: DC
  Filled 2024-01-03: qty 21, 21d supply, fill #0

## 2024-01-03 MED ORDER — LEVOTHYROXINE SODIUM 175 MCG PO TABS
175.0000 ug | ORAL_TABLET | Freq: Every day | ORAL | 3 refills | Status: DC
  Filled 2024-01-03: qty 30, 30d supply, fill #0

## 2024-01-03 NOTE — Discharge Summary (Signed)
 Physician Discharge Summary   Patient: Vanessa Berg MRN: [id number] DOB: [birth year]  Admit date:     01/01/2024  Discharge date: 01/03/24  Discharge Physician: Drue ONEIDA Potter   PCP: Redmon, [place], PA   Recommendations at discharge:  Follow-up with PCP  Discharge Diagnoses: Principal Problem:   Acute focal neurological deficit Active Problems:   Acute CVA (cerebrovascular accident) (HCC)   Hypothyroidism   Postural dizziness with presyncope   Abnormal CT of the chest--RUL opacity   Moderate persistent asthma   Essential hypertension   Migraines  Resolved Problems:   * No resolved hospital problems. [facility] Course:   Brief hospital course: From HPI Vanessa Berg is a 63 y.o. female with medical history significant for HTN, HLD, CAD, hypothyroidism, migraines, currently undergoing workup for unexplained weight loss, being admitted for stroke/syncope workup.  Patient was in her usual state of health then started feeling dizzy.  Her husband who witnessed the episode caught her and eased her down.  She then had generalized shaking and felt weak all over, more on the right side and appeared confused and disoriented.  She also has a headache and impaired vision out of her right eye.  On arrival to the ED vitals were within normal limits and she had an NIH of 2 whereupon stroke alert was activated.  Stat CT head was negative,CTA head and neck that showed no LVO (did show multifocal right upper lobe opacity suspicious for aspiration or infection) Had a teleneurology consult with recommendation for tPA but patient declined.  Recommendation for Plavix  load and admit for stroke workup. Other workup in the ED: Blood work notable for elevated TSH of 68 with depressed T4 of 0.5.  UA showed large leukocytes otherwise unremarkable.  UDS, CBC and CMP unremarkable. EKG showed sinus at 75 with nonspecific T wave abnormalities.  MRI of the brain did not show  acute stroke.  Echocardiogram showed grade 2 diastolic dysfunction.  Other assessment and plan as noted below   Assessment and Plan:   Acute focal neurological deficit Possible TIA Patient with right-sided weakness preceded by presyncope, s/p stroke alert, seen by teleneuro Permissive hypertension for first 24-48 hrs post stroke onset: Prn Labetalol IV or Vasotec IV If BP greater than 220/120  Statins for LDL goal less than 70 ASA 81mg  daily, Plavix  75mg  daily x 3 weeks then monotherapy thereafter(received Plavix  load in the ED) Telemetry, echo, MRI--> negative for stroke I have discussed with neurologist Dr. Elida Ross Patient was seen by PT OT with recommendation for home health     Postural dizziness with presyncope Unclear etiology, stroke versus symptomatic hypothyroidism versus pneumonia versus DVT CTA head and neck did not show large vessel occlusion EEG reviewed-did not show seizures Continue treatment for hypothyroidism CT showing right upper lobe opacity but otherwise workup not consistent with infection Echocardiogram showed grade 2 diastolic dysfunction with normal EF    Hypothyroidism Possibly symptomatic TSH 68 with T4 of 0.5 Received levothyroxine  350 mcg in the ED Will continue with daily levothyroxine  175 mcg Continuous cardiac monitoring   Migraines Patient has associated headache Pain control.   Essential hypertension Continue current blood pressure medication   Moderate persistent asthma Not acutely exacerbated Albuterol  as needed   Consultants: Neurology Procedures performed: None Disposition: Home Diet recommendation:  Cardiac diet DISCHARGE MEDICATION: Allergies as of 01/03/2024       Reactions   Amoxicillin  Other (See Comments), Anaphylaxis   Other Reaction(s): Unknown   Bee Venom Anaphylaxis,  Hives, Swelling   Swelling of throat and whole body    Ibuprofen Other (See Comments)   seizures   Morphine  Hives, Shortness Of Breath    Morphine  And Codeine Shortness Of Breath   Nsaids Anaphylaxis, Hives, Shortness Of Breath, Swelling   Swelling of eyes and throat    Tylenol  [acetaminophen ] Anaphylaxis, Hives, Shortness Of Breath   Difficulty breathing, hives, swelling   Duloxetine  Hcl Other (See Comments)   nausea Other Reaction(s): Unknown   Latex Hives, Itching   Rosuvastatin  Other (See Comments)   Sertraline Hcl Other (See Comments)        Medication List     STOP taking these medications    hydrochlorothiazide  25 MG tablet Commonly known as: HYDRODIURIL    metoprolol  succinate 25 MG 24 hr tablet Commonly known as: TOPROL -XL   oxybutynin 10 MG 24 hr tablet Commonly known as: DITROPAN-XL   tamoxifen  20 MG tablet Commonly known as: NOLVADEX        TAKE these medications    Aimovig  140 MG/ML Soaj Generic drug: Erenumab -aooe Inject 140 mg into the skin every 30 (thirty) days.   albuterol  108 (90 Base) MCG/ACT inhaler Commonly known as: VENTOLIN  HFA Inhale 1 puff into the lungs every 4 (four) hours as needed.   alosetron  0.5 MG tablet Commonly known as: LOTRONEX  Take 0.5 mg by mouth 2 (two) times daily.   ALPRAZolam  0.5 MG tablet Commonly known as: Xanax  Take 1 tablet (0.5 mg total) by mouth at bedtime as needed for anxiety.   amitriptyline  25 MG tablet Commonly known as: ELAVIL  Take 1 tablet (25 mg total) by mouth at bedtime.   ARIPiprazole  2 MG tablet Commonly known as: ABILIFY  TAKE 2 TABLETS BY MOUTH ONCE A DAY AT BEDTIME FOR DEPRESSION   aspirin  81 MG tablet Take 1 tablet (81 mg total) by mouth daily.   atorvastatin  80 MG tablet Commonly known as: LIPITOR Take 1 tablet (80 mg total) by mouth daily. Start taking on: January 04, 2024 What changed:  medication strength how much to take   benzonatate  100 MG capsule Commonly known as: TESSALON  Take 100 mg by mouth 3 (three) times daily as needed for cough.   budesonide -formoterol  80-4.5 MCG/ACT inhaler Commonly known as:  SYMBICORT  Inhale 2 puffs into the lungs 2 (two) times daily.   busPIRone 10 MG tablet Commonly known as: BUSPAR Take 10 mg by mouth 2 (two) times daily.   clopidogrel  75 MG tablet Commonly known as: PLAVIX  Take 1 tablet (75 mg total) by mouth daily for 21 days. Start taking on: January 04, 2024   EPINEPHrine  0.3 mg/0.3 mL Soaj injection Commonly known as: EpiPen  2-Pak inject as directed as needed   fluticasone  50 MCG/ACT nasal spray Commonly known as: FLONASE  Place 1 spray into both nostrils daily.   levothyroxine  175 MCG tablet Commonly known as: SYNTHROID  Take 1 tablet (175 mcg total) by mouth daily before breakfast.   montelukast  10 MG tablet Commonly known as: SINGULAIR  Take 1 tablet (10 mg total) by mouth daily.   multivitamin tablet Take 1 tablet by mouth daily.   ondansetron  4 MG tablet Commonly known as: ZOFRAN  Take 1 tablet (4 mg total) by mouth daily as needed for Nausea.   pantoprazole  40 MG tablet Commonly known as: PROTONIX  Take 1 tablet (40 mg total) by mouth daily. Needs appointment for further refills.   pregabalin  50 MG capsule Commonly known as: LYRICA  Take 50 mg by mouth 3 (three) times daily.   scopolamine  1 MG/3DAYS Commonly known  as: TRANSDERM-SCOP Place 1 patch onto the skin every 3 (three) days.   tiZANidine  4 MG tablet Commonly known as: ZANAFLEX  Take 1 tablet (4 mg total) by mouth 3 (three) times daily.   tolterodine 4 MG 24 hr capsule Commonly known as: DETROL LA Take 4 mg by mouth daily.        Follow-up Information     Redmon, [place], PA Follow up.   Specialty: Physician Assistant Why: hospital follow up Contact information: [address] [place] [address] [phone]                Discharge Exam: Fredricka Weights   01/02/24 0020 01/02/24 0501 01/03/24 0459  Weight: 64.6 kg 63.1 kg 64.2 kg   Vitals and nursing note reviewed.  Constitutional:      General: She is not in acute  distress. HENT:     Head: Normocephalic and atraumatic.  Cardiovascular:     Rate and Rhythm: Normal rate and regular rhythm.     Heart sounds: Normal heart sounds.  Pulmonary:     Effort: Pulmonary effort is normal.     Breath sounds: Normal breath sounds.  Abdominal:     Palpations: Abdomen is soft.     Tenderness: There is no abdominal tenderness.   Condition at discharge: good  The results of significant diagnostics from this hospitalization (including imaging, microbiology, ancillary and laboratory) are listed below for reference.   Imaging Studies: ECHOCARDIOGRAM COMPLETE Result Date: 01/02/2024    ECHOCARDIOGRAM REPORT   Patient Name:   ALIEGHA PAULLIN Date of Exam: 01/02/2024 Medical Rec #:  [id number]                       Height:       60.0 in Accession #:    [id number]                      Weight:       139.1 lb Date of Birth:  04-25-1960                      BSA:          1.600 m Patient Age:    63 years                        BP:           106/62 mmHg Patient Gender: F                               HR:           68 bpm. Exam Location:  [facility] Procedure: 2D Echo, Saline Contrast Bubble Study, Color Doppler and Cardiac            Doppler (Both Spectral and Color Flow Doppler were utilized during            procedure). Indications:     Stroke I63.9  History:         Patient has prior history of Echocardiogram examinations, most                  recent 10/10/2017. Risk Factors:Hypertension. Edema.  Sonographer:     Christopher Furnace Referring Phys:  [id number] DELAYNE LULLA SOLIAN Diagnosing Phys: Cara JONETTA Lovelace MD IMPRESSIONS  1. Left ventricular ejection fraction, by estimation, is 60  to 65%. The left ventricle has normal function. The left ventricle has no regional wall motion abnormalities. Left ventricular diastolic parameters are consistent with Grade II diastolic dysfunction (pseudonormalization).  2. Right ventricular systolic function is normal. The right ventricular size is  mildly enlarged.  3. The mitral valve is normal in structure. Trivial mitral valve regurgitation.  4. The aortic valve is normal in structure. Aortic valve regurgitation is not visualized. Aortic valve sclerosis is present, with no evidence of aortic valve stenosis.  5. Agitated saline contrast bubble study was negative, with no evidence of any interatrial shunt. FINDINGS  Left Ventricle: Left ventricular ejection fraction, by estimation, is 60 to 65%. The left ventricle has normal function. The left ventricle has no regional wall motion abnormalities. Strain was performed and the global longitudinal strain is indeterminate. The left ventricular internal cavity size was normal in size. There is no left ventricular hypertrophy. Left ventricular diastolic parameters are consistent with Grade II diastolic dysfunction (pseudonormalization). Right Ventricle: The right ventricular size is mildly enlarged. No increase in right ventricular wall thickness. Right ventricular systolic function is normal. Left Atrium: Left atrial size was normal in size. Right Atrium: Right atrial size was normal in size. Pericardium: Trivial pericardial effusion is present. Mitral Valve: The mitral valve is normal in structure. Trivial mitral valve regurgitation. MV peak gradient, 3.0 mmHg. The mean mitral valve gradient is 1.0 mmHg. Tricuspid Valve: The tricuspid valve is normal in structure. Tricuspid valve regurgitation is mild. Aortic Valve: The aortic valve is normal in structure. Aortic valve regurgitation is not visualized. Aortic valve sclerosis is present, with no evidence of aortic valve stenosis. Aortic valve mean gradient measures 2.0 mmHg. Aortic valve peak gradient measures 4.7 mmHg. Aortic valve area, by VTI measures 3.38 cm. Pulmonic Valve: The pulmonic valve was not well visualized. Pulmonic valve regurgitation is not visualized. Aorta: The ascending aorta was not well visualized. IAS/Shunts: No atrial level shunt detected by  color flow Doppler. Agitated saline contrast was given intravenously to evaluate for intracardiac shunting. Agitated saline contrast bubble study was negative, with no evidence of any interatrial shunt. Additional Comments: 3D was performed not requiring image post processing on an independent workstation and was indeterminate.  LEFT VENTRICLE PLAX 2D LVIDd:         4.40 cm   Diastology LVIDs:         2.90 cm   LV e' medial:    5.44 cm/s LV PW:         0.80 cm   LV E/e' medial:  13.2 LV IVS:        1.10 cm   LV e' lateral:   9.25 cm/s LVOT diam:     2.00 cm   LV E/e' lateral: 7.8 LV SV:         67 LV SV Index:   42 LVOT Area:     3.14 cm LV IVRT:       129 msec  RIGHT VENTRICLE RV Basal diam:  3.20 cm RV Mid diam:    3.20 cm RV S prime:     11.50 cm/s TAPSE (M-mode): 2.0 cm LEFT ATRIUM             Index        RIGHT ATRIUM           Index LA diam:        2.40 cm 1.50 cm/m   RA Area:     10.10 cm LA Vol (A2C):  45.0 ml 28.13 ml/m  RA Volume:   20.60 ml  12.88 ml/m LA Vol (A4C):   15.1 ml 9.44 ml/m LA Biplane Vol: 27.2 ml 17.00 ml/m  AORTIC VALVE AV Area (Vmax):    3.03 cm AV Area (Vmean):   2.61 cm AV Area (VTI):     3.38 cm AV Vmax:           108.00 cm/s AV Vmean:          71.300 cm/s AV VTI:            0.197 m AV Peak Grad:      4.7 mmHg AV Mean Grad:      2.0 mmHg LVOT Vmax:         104.00 cm/s LVOT Vmean:        59.300 cm/s LVOT VTI:          0.212 m LVOT/AV VTI ratio: 1.08  AORTA Ao Root diam: 3.00 cm MITRAL VALVE               TRICUSPID VALVE MV Area (PHT): 2.93 cm    TR Peak grad:   26.0 mmHg MV Area VTI:   2.29 cm    TR Vmax:        255.00 cm/s MV Peak grad:  3.0 mmHg MV Mean grad:  1.0 mmHg    SHUNTS MV Vmax:       0.86 m/s    Systemic VTI:  0.21 m MV Vmean:      50.1 cm/s   Systemic Diam: 2.00 cm MV Decel Time: 259 msec MV E velocity: 72.00 cm/s MV A velocity: 58.70 cm/s MV E/A ratio:  1.23 Cara JONETTA Lovelace MD Electronically signed by Cara JONETTA Lovelace MD Signature Date/Time: 01/02/2024/4:52:42  PM    Final    EEG adult Result Date: 01/02/2024 Matthews Elida HERO, MD     01/02/2024  4:05 PM Routine EEG Report Shakiyla Kook Berg is a 63 y.o. female with a history of syncope who is undergoing an EEG to evaluate for seizures. Report: This EEG was acquired with electrodes placed according to the International 10-20 electrode system (including Fp1, Fp2, F3, F4, C3, C4, P3, P4, O1, O2, T3, T4, T5, T6, A1, A2, Fz, Cz, Pz). The following electrodes were missing or displaced: none. The occipital dominant rhythm was 8.5 Hz. This activity is reactive to stimulation. Drowsiness was manifested by background fragmentation; deeper stages of sleep were identified by K complexes and sleep spindles. There was no focal slowing. There were no interictal epileptiform discharges. There were no electrographic seizures identified. There was no abnormal response to photic stimulation or hyperventilation. Impression: This EEG was obtained while awake and asleep and is normal.   Clinical Correlation: Normal EEGs, however, do not rule out epilepsy. Elida Matthews, MD [facility] [phone] If 7pm- 7am, please page neurology on call as listed in AMION.   MR BRAIN WO CONTRAST Result Date: 01/02/2024 EXAM: MRI BRAIN WITHOUT CONTRAST 01/02/2024 02:16:09 AM TECHNIQUE: Multiplanar multisequence MRI of the head/brain was performed without the administration of intravenous contrast. COMPARISON: 04/06/2017 CLINICAL HISTORY: Neuro deficit, acute, stroke suspected. FINDINGS: BRAIN AND VENTRICLES: No acute infarct. No intracranial hemorrhage. No mass. No midline shift. No hydrocephalus. The sella is unremarkable. Normal flow voids. Multifocal hyperintense T2-weighted signal within the cerebral white matter, most commonly due to chronic small vessel disease. ORBITS: No acute abnormality. SINUSES AND MASTOIDS: No acute abnormality. BONES AND SOFT TISSUES: Normal marrow signal. No acute soft tissue  abnormality.  IMPRESSION: 1. No acute intracranial abnormality. 2. Multifocal T2 hyperintense cerebral white matter signal most consistent with chronic small vessel disease. Electronically signed by: Franky Stanford MD 01/02/2024 02:43 AM EST RP Workstation: [id number]   CT ANGIO HEAD NECK W WO CM (CODE STROKE) Result Date: 01/01/2024 EXAM: CTA HEAD AND NECK WITH CONTRAST 01/01/2024 10:38:49 PM TECHNIQUE: CTA of the head and neck was performed with the administration of 75 mL of iohexol  (OMNIPAQUE ) 350 MG/ML injection. Multiplanar 2D and/or 3D reformatted images are provided for review. Automated exposure control, iterative reconstruction, and/or weight based adjustment of the mA/kV was utilized to reduce the radiation dose to as low as reasonably achievable. Stenosis of the internal carotid arteries measured using NASCET criteria. COMPARISON: None available CLINICAL HISTORY: Neuro deficit, acute, stroke suspected. FINDINGS: CTA NECK: AORTIC ARCH AND ARCH VESSELS: No dissection or arterial injury. No significant stenosis of the brachiocephalic or subclavian arteries. CERVICAL CAROTID ARTERIES: No dissection, arterial injury, or hemodynamically significant stenosis by NASCET criteria. CERVICAL VERTEBRAL ARTERIES: No dissection, arterial injury, or significant stenosis. LUNGS AND MEDIASTINUM: Multifocal opacities in the right upper lobe likely aspiration/infection. SOFT TISSUES: No acute abnormality. BONES: No acute abnormality. CTA HEAD: ANTERIOR CIRCULATION: No significant stenosis of the internal carotid arteries. No significant stenosis of the anterior cerebral arteries. No significant stenosis of the middle cerebral arteries. No aneurysm. POSTERIOR CIRCULATION: No significant stenosis of the posterior cerebral arteries. No significant stenosis of the basilar artery. No significant stenosis of the vertebral arteries. No aneurysm. OTHER: No dural venous sinus thrombosis on this non-dedicated study. IMPRESSION: 1. No large vessel  occlusion, hemodynamically significant stenosis, or aneurysm in the head or neck. 2. Multifocal right upper lobe opacities suspicious for aspiration or infection. Electronically signed by: Franky Stanford MD 01/01/2024 10:47 PM EST RP Workstation: [id number]   DG Chest Portable 1 View Result Date: 01/01/2024 CLINICAL DATA:  altered, seizure? EXAM: PORTABLE CHEST - 1 VIEW COMPARISON:  03/20/2020 FINDINGS: No focal airspace consolidation, pleural effusion, or pneumothorax. No cardiomegaly. No acute fracture or destructive lesions. Multilevel thoracic osteophytosis. Cholecystectomy clips. IMPRESSION: No acute cardiopulmonary abnormality. Electronically Signed   By: Rogelia Myers M.D.   On: 01/01/2024 22:03   CT HEAD CODE STROKE WO CONTRAST (LKW 0-4.5h, LVO 0-24h) Result Date: 01/01/2024 EXAM: CT HEAD WITHOUT CONTRAST 01/01/2024 09:22:13 PM TECHNIQUE: CT of the head was performed without the administration of intravenous contrast. Automated exposure control, iterative reconstruction, and/or weight based adjustment of the mA/kV was utilized to reduce the radiation dose to as low as reasonably achievable. COMPARISON: None available. CLINICAL HISTORY: Neuro deficit, acute, stroke suspected; right sided weakness, syncope. FINDINGS: BRAIN AND VENTRICLES: No acute hemorrhage. No evidence of acute infarct. No hydrocephalus. No extra-axial collection. No mass effect or midline shift. ORBITS: No acute abnormality. SINUSES: No acute abnormality. SOFT TISSUES AND SKULL: No acute soft tissue abnormality. No skull fracture. [place] Stroke Program Early CT Score (ASPECTS) ----- Ganglionic (caudate, IC, lentiform nucleus, insula, M1-M3): 7 Supraganglionic (M4-M6): 3 Total: 10 IMPRESSION: 1. No acute intracranial abnormality 2. ASPECTS: 10. 3. Findings communicated to Dr Ester Sharps at 9:30 pm on 01/01/2024 Electronically signed by: Franky Stanford MD 01/01/2024 09:30 PM EST RP Workstation: [id number]    Microbiology: Results for  orders placed or performed during the hospital encounter of 01/01/24  Resp panel by RT-PCR (RSV, Flu A&B, Covid) Anterior Nasal Swab     Status: None   Collection Time: 01/02/24  2:30 AM   Specimen: Anterior Nasal Swab  Result Value  Ref Range Status   SARS Coronavirus 2 by RT PCR NEGATIVE NEGATIVE Final    Comment: (NOTE) SARS-CoV-2 target nucleic acids are NOT DETECTED.  The SARS-CoV-2 RNA is generally detectable in upper respiratory specimens during the acute phase of infection. The lowest concentration of SARS-CoV-2 viral copies this assay can detect is 138 copies/mL. A negative result does not preclude SARS-Cov-2 infection and should not be used as the sole basis for treatment or other patient management decisions. A negative result may occur with  improper specimen collection/handling, submission of specimen other than nasopharyngeal swab, presence of viral mutation(s) within the areas targeted by this assay, and inadequate number of viral copies(<138 copies/mL). A negative result must be combined with clinical observations, patient history, and epidemiological information. The expected result is Negative.  Fact Sheet for Patients:  bloggercourse.com  Fact Sheet for Healthcare Providers:  seriousbroker.it  This test is no t yet approved or cleared by the [place]  FDA and  has been authorized for detection and/or diagnosis of SARS-CoV-2 by FDA under an Emergency Use Authorization (EUA). This EUA will remain  in effect (meaning this test can be used) for the duration of the COVID-19 declaration under Section 564(b)(1) of the Act, 21 U.S.C.section 360bbb-3(b)(1), unless the authorization is terminated  or revoked sooner.       Influenza A by PCR NEGATIVE NEGATIVE Final   Influenza B by PCR NEGATIVE NEGATIVE Final    Comment: (NOTE) The Xpert Xpress SARS-CoV-2/FLU/RSV plus assay is intended as an aid in the diagnosis of  influenza from Nasopharyngeal swab specimens and should not be used as a sole basis for treatment. Nasal washings and aspirates are unacceptable for Xpert Xpress SARS-CoV-2/FLU/RSV testing.  Fact Sheet for Patients: bloggercourse.com  Fact Sheet for Healthcare Providers: seriousbroker.it  This test is not yet approved or cleared by the [place]  FDA and has been authorized for detection and/or diagnosis of SARS-CoV-2 by FDA under an Emergency Use Authorization (EUA). This EUA will remain in effect (meaning this test can be used) for the duration of the COVID-19 declaration under Section 564(b)(1) of the Act, 21 U.S.C. section 360bbb-3(b)(1), unless the authorization is terminated or revoked.     Resp Syncytial Virus by PCR NEGATIVE NEGATIVE Final    Comment: (NOTE) Fact Sheet for Patients: bloggercourse.com  Fact Sheet for Healthcare Providers: seriousbroker.it  This test is not yet approved or cleared by the [place]  FDA and has been authorized for detection and/or diagnosis of SARS-CoV-2 by FDA under an Emergency Use Authorization (EUA). This EUA will remain in effect (meaning this test can be used) for the duration of the COVID-19 declaration under Section 564(b)(1) of the Act, 21 U.S.C. section 360bbb-3(b)(1), unless the authorization is terminated or revoked.  Performed at [facility], [address] [place], [place] [address]     Labs: CBC: Recent Labs  Lab 01/01/24 2108 01/03/24 0705  WBC 7.0 4.8  NEUTROABS 3.8 2.0  HGB 12.8 12.5  HCT 38.0 36.9  MCV 90.7 91.3  PLT 243 247   Basic Metabolic Panel: Recent Labs  Lab 01/01/24 2108 01/03/24 0705  NA 138 139  K 3.9 4.6  CL 106 103  CO2 20* 30  GLUCOSE 71 70  BUN 13 9  CREATININE 0.63 0.84  CALCIUM  8.5* 9.0   Liver Function Tests: Recent Labs  Lab 01/01/24 2108  AST 23  ALT 9   ALKPHOS 79  BILITOT 0.4  PROT 6.1*  ALBUMIN 3.6   CBG: Recent  Labs  Lab 01/01/24 2120 01/02/24 0654 01/02/24 0802 01/03/24 0451  GLUCAP 68* 62* 70 78    Discharge time spent:  39 minutes.  Signed: Drue ONEIDA Potter, MD [facility] 01/03/2024

## 2024-01-03 NOTE — Plan of Care (Signed)
  Problem: Cardiac: Goal: Will achieve and/or maintain adequate cardiac output Outcome: Progressing   Problem: Coping: Goal: Will verbalize positive feelings about self Outcome: Progressing   Problem: Ischemic Stroke/TIA Tissue Perfusion: Goal: Complications of ischemic stroke/TIA will be minimized Outcome: Progressing   Problem: Self-Care: Goal: Ability to communicate needs accurately will improve Outcome: Progressing

## 2024-01-03 NOTE — TOC Transition Note (Signed)
 Transition of Care [facility]) - Progression Note    Patient Details  Name: Vanessa Berg MRN: [id number] Date of Birth: 1960-06-08  Transition of Care [facility]) CM/SW Contact  Marua Qin L Miamarie Moll, [place] Phone Number: 01/03/2024, 4:32 PM  Clinical Narrative:     CSW spoke with patient. DME declined. Patient advised that she does not need the RW, she stated that she walks better without it.   Recommendations for home health discussed. Patients insurance out of network. Patient was agreeable to outpatient PT/OT. She advised that she would like referral sent to [facility] at [address] in [place], [place].   Patient requested a note from the doctor for her employer. Secure chat sent to attending.   Referral for outpatient PT/OT sent to [facility] TOC signing off.                     Expected Discharge Plan and Services         Expected Discharge Date: 01/03/24                                     Social Drivers of Health (SDOH) Interventions SDOH Screenings   Food Insecurity: No Food Insecurity (01/02/2024)  Housing: Unknown (01/02/2024)  Transportation Needs: No Transportation Needs (01/02/2024)  Utilities: Not At Risk (01/02/2024)  Financial Resource Strain: Low Risk  (11/07/2023)   Received from [facility]  Physical Activity: Insufficiently Active (11/07/2023)   Received from [facility]  Social Connections: Socially Integrated (11/07/2023)   Received from [facility]  Stress: No Stress Concern Present (11/07/2023)   Received from [facility]  Tobacco Use: Medium Risk (01/01/2024)    Readmission Risk Interventions     No data to display

## 2024-01-03 NOTE — Evaluation (Signed)
 Physical Therapy Evaluation Patient Details Name: Vanessa Berg MRN: [id number] DOB: 07-04-1960 Today's Date: 01/03/2024  History of Present Illness  Pt is a 63 y.o. female  currently undergoing workup for unexplained weight loss, being admitted for stroke/syncope workup. CT head was negative,CTA head and neck that showed no LVO. Current MD assessment: Acute focal neurological deficit and possible TIA/CVA. PMH of HTN, HLD, CAD, hypothyroidism, migraines.  Clinical Impression  63 yo Female admitted to hospital with recent dizziness. Patient lives at home with her husband. She is still working and driving. She reports being independent in all self care ADLs and IADLs prior to admittance. Patient does report 1 recent fall with subsequent fear avoidance with some ambulation. She reports increased dizziness. PT performed orthostatics this morning. While standing, patient reports feeling increased dizziness and nauseated. After 2 min she requested to lay back down. Vitals did not show a significant drop of BP. RN came in to give meds and per patient request, PT left and returned later in the AM to continue therapy evaluation. Patient demonstrates independent bed mobility. She transfers with supervision initially demonstrating mild unsteadiness this AM but with increased repetition and later in the morning demonstrating better stability. She was able to complete 5 times sit<>Stand without using upper extremity (17.23 sec indicating increased risk for falls). Patient ambulated on level surface, x100 feet without AD demonstrating slower gait speed, short steps requiring close stand by assist. PT instructed patient in ways to improve gait mechanics including trying reciprocal arm movement. Patient unable to coordinate. Unsure if gait instability is a result of medication/fatigue being in the hospital or if it is resultant from an event. Patient would benefit from skilled PT Intervention to improve  balance and gait safety.       If plan is discharge home, recommend the following: A little help with walking and/or transfers;Assistance with cooking/housework;A little help with bathing/dressing/bathroom;Help with stairs or ramp for entrance;Assist for transportation   Can travel by private vehicle        Equipment Recommendations Rolling walker (2 wheels)  Recommendations for Other Services       Functional Status Assessment Patient has had a recent decline in their functional status and demonstrates the ability to make significant improvements in function in a reasonable and predictable amount of time.     Precautions / Restrictions Precautions Precautions: Fall Recall of Precautions/Restrictions: Intact Restrictions Weight Bearing Restrictions Per Provider Order: No      Mobility  Bed Mobility Overal bed mobility: Independent             General bed mobility comments: able to transfer supine<>sitting without difficulty demonstrating good positioning and safety    Transfers Overall transfer level: Needs assistance Equipment used: None Transfers: Sit to/from Stand Sit to Stand: Supervision           General transfer comment: Pt required supervision this AM during orthostatic vitals with mild unsteadiness; later this AM able to complete 5 times sit<>Stand (17.23 sec indicating increased risk for falls) without instability; uses grab bar to complete toilet transfer    Ambulation/Gait Ambulation/Gait assistance: Contact guard assist Gait Distance (Feet): 100 Feet Assistive device: None   Gait velocity: decreased     General Gait Details: pt ambulates with normal base of support, slower gait speed, shorter step length; PT instructed patient in ways to improve gait mechanics including: arm swing to facilitate better balance/coordination, but patient unable to coordinate; provided verbal cues to increase step length and speed but  patient unable. Requires CGA to  close stand by assist  Stairs            Wheelchair Mobility     Tilt Bed    Modified Rankin (Stroke Patients Only)       Balance Overall balance assessment: Needs assistance Sitting-balance support: Feet supported Sitting balance-Leahy Scale: Good     Standing balance support: No upper extremity supported Standing balance-Leahy Scale: Fair Standing balance comment: able to stand unsupported without instability; able to maintain eyes closed 10 sec without sway; reports increased dizziness when looking up/down, not when looking side/side                             Pertinent Vitals/Pain Pain Assessment Pain Assessment: No/denies pain Pain Score: 0-No pain Pain Location: headache Pain Descriptors / Indicators: Headache Pain Intervention(s): Patient requesting pain meds-RN notified    Home Living Family/patient expects to be discharged to:: Private residence Living Arrangements: Spouse/significant other Available Help at Discharge: Family;Available PRN/intermittently Type of Home: Mobile home Home Access: Stairs to enter Entrance Stairs-Rails: Right;Left;Can reach both Entrance Stairs-Number of Steps: 6   Home Layout: One level Home Equipment: None      Prior Function Prior Level of Function : Independent/Modified Independent;Working/employed;Driving             Mobility Comments: IND ADLs Comments: 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 [place]     Extremity/Trunk Assessment   Upper Extremity Assessment Upper Extremity Assessment: Generalized weakness    Lower Extremity Assessment Lower Extremity Assessment: Generalized weakness    Cervical / Trunk Assessment Cervical / Trunk Assessment: Normal  Communication   Communication Communication: No apparent difficulties    Cognition Arousal: Alert Behavior During Therapy: WFL for tasks assessed/performed                             Following commands: Intact        Cueing Cueing Techniques: Verbal cues     General Comments General comments (skin integrity, edema, etc.): pt reports increased nausea and dizziness this AM during orthostatic vitals; while she doesn't demonstrate significant drop of systolic or diastolic, patient requested to lay back down; therapist returned later in the morning to complete PT evaluation.    Exercises Other Exercises Other Exercises: Educated patient in role of PT and recommendations; Other Exercises: During evaluation, educated patient in ways to help reduce orthostatic hypotension including moving slowly when changing positions and increase upper extremity movement; PT also provided gait training education trying to improve gait mechanics with instruction for reciprocal arm swing (pt unable to achieve despite cues/education) and to increase step length/speed.   Assessment/Plan    PT Assessment Patient needs continued PT services  PT Problem List Decreased activity tolerance;Decreased balance;Decreased mobility;Cardiopulmonary status limiting activity       PT Treatment Interventions Gait training;Neuromuscular re-education;Stair training;Functional mobility training;Therapeutic activities;Therapeutic exercise;Balance training;Patient/family education    PT Goals (Current goals can be found in the Care Plan section)  Acute Rehab PT Goals Patient Stated Goal: to go home and get better PT Goal Formulation: With patient Time For Goal Achievement: 01/17/24 Potential to Achieve Goals: Good    Frequency Min 1X/week     Co-evaluation               AM-PAC PT 6 Clicks Mobility  Outcome Measure Help needed turning from your back to your side while  in a flat bed without using bedrails?: None Help needed moving from lying on your back to sitting on the side of a flat bed without using bedrails?: None Help needed moving to and from a bed to a chair (including a wheelchair)?: None Help needed standing up  from a chair using your arms (e.g., wheelchair or bedside chair)?: None Help needed to walk in hospital room?: A Little Help needed climbing 3-5 steps with a railing? : A Little 6 Click Score: 22    End of Session Equipment Utilized During Treatment: Gait belt Activity Tolerance: Patient tolerated treatment well;Patient limited by fatigue Patient left: in bed;with family/visitor present Nurse Communication: Mobility status;Other (comment) (pt requesting dressing change) PT Visit Diagnosis: Unsteadiness on feet (R26.81);Other abnormalities of gait and mobility (R26.89)    Time: 1110-1145 PT Time Calculation (min) (ACUTE ONLY): 35 min  PT in patient room: 8:29-8:44, then again 11:25-11:45  Charges:   PT Evaluation $PT Eval Low Complexity: 1 Low PT Treatments $Therapeutic Activity: 8-22 mins PT General Charges $$ ACUTE PT VISIT: 1 Visit         Dreshawn Hendershott PT, DPT 01/03/2024, 1:09 PM

## 2024-01-03 NOTE — Progress Notes (Signed)
     [facility] REFERRAL        Occupational Therapy * Physical Therapy * Speech Therapy                           DATE: 01/03/2024  PATIENT NAME : Vanessa Berg  PATIENT MRN: [id number]       DIAGNOSIS/DIAGNOSIS CODE: Acute focal neurological deficit   DATE OF DISCHARGE: 01/03/2024       PRIMARY CARE PHYSICIAN:   Noelle Redmon PA     PCP PHONE/FAX [phone]      Dear Provider (Name: [facility] outpatient __  Fax: [phone]   I certify that I have examined this patient and that occupational/physical/speech therapy is necessary on an outpatient basis.    The patient has expressed interest in completing their recommended course of therapy at your  location.  Once a formal order from the patient's primary care physician has been obtained, please  contact him/her to schedule an appointment for evaluation at your earliest convenience.   [ x]  Physical Therapy Evaluate and Treat  [ x ]  Occupational Therapy Evaluate and Treat  [  ]  Speech Therapy Evaluate and Treat         The patient's primary care physician (listed above) must furnish and be responsible for a formal order such that the recommended services may be furnished while under the primary physician's care, and that the plan of care will be established and reviewed every 30 days (or more often if condition necessitates).

## 2024-01-04 ENCOUNTER — Other Ambulatory Visit: Payer: Self-pay

## 2024-01-12 ENCOUNTER — Other Ambulatory Visit (HOSPITAL_COMMUNITY): Payer: Self-pay

## 2024-02-19 ENCOUNTER — Other Ambulatory Visit (HOSPITAL_COMMUNITY): Payer: Self-pay

## 2024-02-19 MED ORDER — METHYLPREDNISOLONE 4 MG PO TBPK
ORAL_TABLET | ORAL | 0 refills | Status: AC
  Filled 2024-02-19: qty 21, 6d supply, fill #0

## 2024-02-20 ENCOUNTER — Other Ambulatory Visit (HOSPITAL_COMMUNITY): Payer: Self-pay
